# Patient Record
Sex: Female | Born: 1965 | Race: White | Hispanic: No | Marital: Married | Smoking: Current every day smoker
Health system: Southern US, Community
[De-identification: ages and names within clinical notes are randomized; demographics above are authoritative.]

## PROBLEM LIST (undated history)

## (undated) DIAGNOSIS — E785 Hyperlipidemia, unspecified: Secondary | ICD-10-CM

## (undated) DIAGNOSIS — Z124 Encounter for screening for malignant neoplasm of cervix: Secondary | ICD-10-CM

## (undated) DIAGNOSIS — R739 Hyperglycemia, unspecified: Secondary | ICD-10-CM

## (undated) DIAGNOSIS — I10 Essential (primary) hypertension: Secondary | ICD-10-CM

## (undated) DIAGNOSIS — Z720 Tobacco use: Secondary | ICD-10-CM

## (undated) DIAGNOSIS — R945 Abnormal results of liver function studies: Secondary | ICD-10-CM

## (undated) DIAGNOSIS — F419 Anxiety disorder, unspecified: Secondary | ICD-10-CM

## (undated) HISTORY — DX: Encounter for screening for malignant neoplasm of cervix: Z12.4

## (undated) HISTORY — DX: Abnormal results of liver function studies: R94.5

## (undated) HISTORY — DX: Tobacco use: Z72.0

## (undated) HISTORY — DX: Anxiety disorder, unspecified: F41.9

## (undated) HISTORY — DX: Hyperglycemia, unspecified: R73.9

## (undated) HISTORY — DX: Hyperlipidemia, unspecified: E78.5

## (undated) HISTORY — DX: Essential (primary) hypertension: I10

---

## 2002-02-12 ENCOUNTER — Encounter: Payer: Self-pay | Admitting: Emergency Medicine

## 2002-02-12 ENCOUNTER — Emergency Department (HOSPITAL_COMMUNITY): Admission: EM | Admit: 2002-02-12 | Discharge: 2002-02-12 | Payer: Self-pay | Admitting: Emergency Medicine

## 2004-05-10 ENCOUNTER — Other Ambulatory Visit: Admission: RE | Admit: 2004-05-10 | Discharge: 2004-05-10 | Payer: Self-pay | Admitting: Family Medicine

## 2007-12-24 LAB — HM PAP SMEAR

## 2008-08-02 ENCOUNTER — Emergency Department (HOSPITAL_BASED_OUTPATIENT_CLINIC_OR_DEPARTMENT_OTHER): Admission: EM | Admit: 2008-08-02 | Discharge: 2008-08-02 | Payer: Self-pay | Admitting: Emergency Medicine

## 2012-01-20 ENCOUNTER — Ambulatory Visit (INDEPENDENT_AMBULATORY_CARE_PROVIDER_SITE_OTHER): Payer: Managed Care, Other (non HMO) | Admitting: Family Medicine

## 2012-01-20 ENCOUNTER — Encounter: Payer: Self-pay | Admitting: Family Medicine

## 2012-01-20 DIAGNOSIS — Z720 Tobacco use: Secondary | ICD-10-CM

## 2012-01-20 DIAGNOSIS — R002 Palpitations: Secondary | ICD-10-CM

## 2012-01-20 DIAGNOSIS — E785 Hyperlipidemia, unspecified: Secondary | ICD-10-CM

## 2012-01-20 DIAGNOSIS — I10 Essential (primary) hypertension: Secondary | ICD-10-CM

## 2012-01-20 DIAGNOSIS — F419 Anxiety disorder, unspecified: Secondary | ICD-10-CM | POA: Insufficient documentation

## 2012-01-20 DIAGNOSIS — F17200 Nicotine dependence, unspecified, uncomplicated: Secondary | ICD-10-CM

## 2012-01-20 DIAGNOSIS — R7309 Other abnormal glucose: Secondary | ICD-10-CM

## 2012-01-20 DIAGNOSIS — F411 Generalized anxiety disorder: Secondary | ICD-10-CM

## 2012-01-20 DIAGNOSIS — R739 Hyperglycemia, unspecified: Secondary | ICD-10-CM

## 2012-01-20 MED ORDER — HYDROCHLOROTHIAZIDE 25 MG PO TABS: 25.0000 mg | ORAL_TABLET | Freq: Every day | ORAL | Status: DC

## 2012-01-20 NOTE — Patient Instructions (Addendum)

## 2012-01-22 ENCOUNTER — Encounter: Payer: Self-pay | Admitting: Family Medicine

## 2012-01-22 ENCOUNTER — Ambulatory Visit (INDEPENDENT_AMBULATORY_CARE_PROVIDER_SITE_OTHER): Payer: Managed Care, Other (non HMO) | Admitting: Family Medicine

## 2012-01-22 VITALS — BP 136/85 | HR 82 | Temp 98.2°F | Ht 62.0 in | Wt 219.8 lb

## 2012-01-22 DIAGNOSIS — I10 Essential (primary) hypertension: Secondary | ICD-10-CM

## 2012-01-22 DIAGNOSIS — H6091 Unspecified otitis externa, right ear: Secondary | ICD-10-CM

## 2012-01-22 DIAGNOSIS — J029 Acute pharyngitis, unspecified: Secondary | ICD-10-CM

## 2012-01-22 DIAGNOSIS — Z720 Tobacco use: Secondary | ICD-10-CM

## 2012-01-22 DIAGNOSIS — F17200 Nicotine dependence, unspecified, uncomplicated: Secondary | ICD-10-CM

## 2012-01-22 MED ORDER — AMOXICILLIN-POT CLAVULANATE 875-125 MG PO TABS: 1.0000 | ORAL_TABLET | Freq: Two times a day (BID) | ORAL | Status: AC

## 2012-01-22 MED ORDER — PREDNISONE 20 MG PO TABS: 40.0000 mg | ORAL_TABLET | Freq: Every day | ORAL | Status: AC

## 2012-01-22 NOTE — Patient Instructions (Signed)

## 2012-01-24 ENCOUNTER — Telehealth: Payer: Self-pay | Admitting: Family Medicine

## 2012-01-24 NOTE — Telephone Encounter (Signed)
That happens sometimes with Augmentin, make sure she is taking a probiotic and eating a yogurt daily and have her add Benefiber 2 tsp twice daily to food or drink. May also take I Imodium as needed for the next few days but let us know if this keeps up

## 2012-01-24 NOTE — Telephone Encounter (Signed)
Pt informed and states understandment 

## 2012-01-24 NOTE — Telephone Encounter (Signed)
Left a message with patients son to return my call. Pt is supposed to be home at 3:15 from work.

## 2012-01-24 NOTE — Telephone Encounter (Signed)
Patient has diarrhea from her antibiotic, she is doing okay doesn't feel like she needs an OV but just needs any tips that might help. She's at work so she said it's okay to leave a detailed mess on her home #.

## 2012-01-24 NOTE — Telephone Encounter (Signed)
PATIENT RETURNED YOUR CALL ABOUT THE ANTIBIOTIC PLEASE LEAVE DETAILED MESSAGE IF YOU DO NOT REACH HER

## 2012-01-26 ENCOUNTER — Encounter: Payer: Self-pay | Admitting: Family Medicine

## 2012-01-26 DIAGNOSIS — Z720 Tobacco use: Secondary | ICD-10-CM

## 2012-01-26 DIAGNOSIS — Z716 Tobacco abuse counseling: Secondary | ICD-10-CM | POA: Insufficient documentation

## 2012-01-26 HISTORY — DX: Tobacco use: Z72.0

## 2012-01-26 NOTE — Assessment & Plan Note (Addendum)
Patient denies any daily medication, does use Alprazolam prn and does not feel she needs daily medication at this time

## 2012-01-26 NOTE — Assessment & Plan Note (Signed)
Encouraged complete cessation, patient has managed to cut back from a ppd, patient will continue her attempts

## 2012-01-26 NOTE — Assessment & Plan Note (Signed)
Avoid caffeine, EKG unremarkable, will consider referral in the future if symptoms worsen

## 2012-01-26 NOTE — Assessment & Plan Note (Signed)
Patient reports a history of elevated blood sugars but has never been told diabetes, will check lab work to help further evaluate. Minimize simple carbs

## 2012-01-26 NOTE — Progress Notes (Signed)
Patient ID: Teresa Calderon, female   DOB: [birth year], 46 y.o.   MRN: [id number] Emilina Smarr [id number] 1966/06/27 01/26/2012      Progress Note New Patient  Subjective  Chief Complaint  Chief Complaint  Patient presents with  . Establish Care    new patient    HPI  Patient is a 46 year old Caucasian female who is in today for an appointment. She is struggling with some intermittent palpitations. She reports it happens several days a week. It is not prolonged nor there any associated symptoms this been going on for years and makes her very anxious. She did use alprazolam occasionally too well the symptoms. She has no other acute complaints. She's cut down half pack per day to 3 cigarettes a day. She denies chest pain, palpitations, shortness of breath, GI or GU complaints. She's not had a mammogram or colonoscopy ever. She complains of a stable white patch on her left anterior thigh.  Past Medical History  Diagnosis Date  . Hyperlipidemia   . Hypertension   . Anxiety   . Palpitations 01/20/2012  . Hyperglycemia   . Tobacco abuse 01/26/2012    Past Surgical History  Procedure Date  . Cesarean section 1990    Family History  Problem Relation Age of Onset  . Hypertension Mother   . Hyperlipidemia Mother   . COPD Father     smoked    History   Social History  . Marital Status: Married    Spouse Name: N/A    Number of Children: N/A  . Years of Education: N/A   Occupational History  . Not on file.   Social History Main Topics  . Smoking status: Current Everyday Smoker -- 0.5 packs/day for 20 years    Types: Cigarettes  . Smokeless tobacco: Never Used  . Alcohol Use: No  . Drug Use: No  . Sexually Active: Yes -- Female partner(s)   Other Topics Concern  . Not on file   Social History Narrative  . No narrative on file    No current outpatient prescriptions on file prior to visit.    No Known Allergies  Review of Systems  Review of Systems  Constitutional:  Negative for fever and malaise/fatigue.  HENT: Negative for hearing loss, congestion and tinnitus.   Eyes: Positive for pain. Negative for discharge.  Respiratory: Negative for cough and shortness of breath.   Cardiovascular: Positive for palpitations. Negative for chest pain and leg swelling.  Gastrointestinal: Negative for nausea, abdominal pain and diarrhea.  Genitourinary: Negative for dysuria, urgency and frequency.  Musculoskeletal: Negative for falls.  Skin: Negative for rash.  Neurological: Negative for dizziness, tingling, loss of consciousness and headaches.  Endo/Heme/Allergies: Negative for environmental allergies and polydipsia.  Psychiatric/Behavioral: Negative for depression and suicidal ideas. The patient is not nervous/anxious and does not have insomnia.     Objective  BP 138/80  Pulse 89  Temp(Src) 97.6 F (36.4 C) (Temporal)  Ht 5\' 2"  (1.575 m)  Wt 221 lb 12.8 oz (100.608 kg)  BMI 40.57 kg/m2  SpO2 97%  LMP 01/13/2012  Physical Exam  Physical Exam  Constitutional: She is oriented to person, place, and time and well-developed, well-nourished, and in no distress. No distress.  HENT:  Head: Normocephalic and atraumatic.  Right Ear: External ear normal.  Left Ear: External ear normal.  Nose: Nose normal.  Mouth/Throat: Oropharynx is clear and moist. No oropharyngeal exudate.  Eyes: Conjunctivae are normal. Pupils are equal, round, and reactive to light. Right  eye exhibits no discharge. Left eye exhibits no discharge. No scleral icterus.  Neck: Normal range of motion. Neck supple. No thyromegaly present.  Cardiovascular: Normal rate, regular rhythm, normal heart sounds and intact distal pulses.   No murmur heard. Pulmonary/Chest: Effort normal and breath sounds normal. No respiratory distress. She has no wheezes. She has no rales.  Abdominal: Soft. Bowel sounds are normal. She exhibits no distension and no mass. There is no tenderness.  Musculoskeletal: Normal  range of motion. She exhibits no edema and no tenderness.  Lymphadenopathy:    She has no cervical adenopathy.  Neurological: She is alert and oriented to person, place, and time. She has normal reflexes. No cranial nerve deficit. Coordination normal.  Skin: Skin is warm and dry. No rash noted. She is not diaphoretic. No erythema.       1 cm raised flesh colored patch left anterior thigh  Psychiatric: Mood, memory and affect normal.       Assessment & Plan  Tobacco abuse Encouraged complete cessation, patient has managed to cut back from a ppd, patient will continue her attempts  Hyperglycemia Patient reports a history of elevated blood sugars but has never been told diabetes, will check lab work to help further evaluate. Minimize simple carbs  Palpitations Avoid caffeine, EKG unremarkable, will consider referral in the future if symptoms worsen  Anxiety Patient denies any daily medication, does use Alprazolam prn and does not feel she needs daily medication at this time  Hypertension Improved on repeat, consider sodium restriction and we will continue current meds for now. Consider DASH diet  Hyperlipidemia Check lipid panel, avoid trans fats, start MegaRed

## 2012-01-26 NOTE — Assessment & Plan Note (Signed)
Improved on repeat, consider sodium restriction and we will continue current meds for now. Consider DASH diet

## 2012-01-26 NOTE — Assessment & Plan Note (Signed)
Check lipid panel, avoid trans fats, start MegaRed

## 2012-01-27 ENCOUNTER — Ambulatory Visit: Payer: Managed Care, Other (non HMO)

## 2012-01-27 ENCOUNTER — Encounter: Payer: Self-pay | Admitting: Family Medicine

## 2012-01-27 ENCOUNTER — Other Ambulatory Visit (INDEPENDENT_AMBULATORY_CARE_PROVIDER_SITE_OTHER): Payer: Managed Care, Other (non HMO)

## 2012-01-27 DIAGNOSIS — R7309 Other abnormal glucose: Secondary | ICD-10-CM

## 2012-01-27 DIAGNOSIS — E785 Hyperlipidemia, unspecified: Secondary | ICD-10-CM

## 2012-01-27 DIAGNOSIS — R002 Palpitations: Secondary | ICD-10-CM

## 2012-01-27 DIAGNOSIS — I10 Essential (primary) hypertension: Secondary | ICD-10-CM

## 2012-01-27 LAB — CBC
Hemoglobin: 16.1 g/dL — ABNORMAL HIGH (ref 12.0–15.0)
Platelets: 276 10*3/uL (ref 150.0–400.0)
WBC: 14.1 10*3/uL — ABNORMAL HIGH (ref 4.5–10.5)

## 2012-01-27 LAB — RENAL FUNCTION PANEL
Albumin: 4 g/dL (ref 3.5–5.2)
BUN: 11 mg/dL (ref 6–23)
CO2: 28 mEq/L (ref 19–32)
Calcium: 9.1 mg/dL (ref 8.4–10.5)
Chloride: 101 mEq/L (ref 96–112)

## 2012-01-27 LAB — LIPID PANEL
Cholesterol: 212 mg/dL — ABNORMAL HIGH (ref 0–200)
VLDL: 27.6 mg/dL (ref 0.0–40.0)

## 2012-01-27 LAB — HEPATIC FUNCTION PANEL
ALT: 58 U/L — ABNORMAL HIGH (ref 0–35)
AST: 40 U/L — ABNORMAL HIGH (ref 0–37)
Albumin: 4 g/dL (ref 3.5–5.2)
Total Protein: 7.7 g/dL (ref 6.0–8.3)

## 2012-01-27 LAB — T4, FREE: Free T4: 0.9 ng/dL (ref 0.60–1.60)

## 2012-01-27 NOTE — Progress Notes (Signed)
Patient ID: Hero Mccathern, female   DOB: February 11, 1966, 46 y.o.   MRN: [id number] Darra Rosa [id number] [birth year] 01/27/2012      Progress Note-Follow Up  Subjective  Chief Complaint  Chief Complaint  Patient presents with  . Sore Throat    X 1 day- left side    HPI  Patient is a 46 year old Caucasian female who is in today with a three-day history of worsening sore throat. She notes a sensation of swelling in her throat and pain worse with swallowing. She is also malaise, myalgias, congestion and cough. No headache, ear pain, chest pain, palpitations, shortness of breath, GI or GU complaints.  Past Medical History  Diagnosis Date  . Hyperlipidemia   . Hypertension   . Anxiety   . Palpitations 01/20/2012  . Hyperglycemia   . Tobacco abuse 01/26/2012  . Pharyngitis 01/27/2012    Past Surgical History  Procedure Date  . Cesarean section 1990    Family History  Problem Relation Age of Onset  . Hypertension Mother   . Hyperlipidemia Mother   . COPD Father     smoked    History   Social History  . Marital Status: Married    Spouse Name: N/A    Number of Children: N/A  . Years of Education: N/A   Occupational History  . Not on file.   Social History Main Topics  . Smoking status: Current Everyday Smoker -- 0.5 packs/day for 20 years    Types: Cigarettes  . Smokeless tobacco: Never Used  . Alcohol Use: No  . Drug Use: No  . Sexually Active: Yes -- Female partner(s)   Other Topics Concern  . Not on file   Social History Narrative  . No narrative on file    Current Outpatient Prescriptions on File Prior to Visit  Medication Sig Dispense Refill  . ALPRAZolam (XANAX) 0.25 MG tablet Take 0.25 mg by mouth daily.      . hydrochlorothiazide (HYDRODIURIL) 25 MG tablet Take 1 tablet (25 mg total) by mouth daily.  30 tablet  5  . metoprolol succinate (TOPROL-XL) 100 MG 24 hr tablet Take 100 mg by mouth daily. Take with or immediately following a meal.      . Multiple  Vitamin (MULTIVITAMIN) tablet Take 1 tablet by mouth daily.        No Known Allergies  Review of Systems  Review of Systems  Constitutional: Positive for malaise/fatigue. Negative for fever.  HENT: Positive for congestion and sore throat.   Eyes: Negative for discharge.  Respiratory: Positive for cough and sputum production. Negative for shortness of breath.   Cardiovascular: Negative for chest pain, palpitations and leg swelling.  Gastrointestinal: Negative for nausea, abdominal pain and diarrhea.  Genitourinary: Negative for dysuria.  Musculoskeletal: Negative for falls.  Skin: Negative for rash.  Neurological: Negative for loss of consciousness and headaches.  Endo/Heme/Allergies: Negative for polydipsia.  Psychiatric/Behavioral: Negative for depression. The patient is not nervous/anxious and does not have insomnia.     Objective  BP 136/85  Pulse 82  Temp(Src) 98.2 F (36.8 C) (Temporal)  Ht 5\' 2"  (1.575 m)  Wt 219 lb 12.8 oz (99.701 kg)  BMI 40.20 kg/m2  SpO2 97%  LMP 01/13/2012  Physical Exam  Physical Exam  Constitutional: She is oriented to person, place, and time and well-developed, well-nourished, and in no distress. No distress.  HENT:  Head: Normocephalic and atraumatic.       Oropharynx erythematous and swollen, white patch on left  tonsil  Eyes: Conjunctivae are normal.  Neck: Neck supple. No thyromegaly present.  Cardiovascular: Normal rate, regular rhythm and normal heart sounds.   No murmur heard. Pulmonary/Chest: Effort normal and breath sounds normal. She has no wheezes.  Abdominal: She exhibits no distension and no mass.  Musculoskeletal: She exhibits no edema.  Lymphadenopathy:    She has cervical adenopathy.  Neurological: She is alert and oriented to person, place, and time.  Skin: Skin is warm and dry. No rash noted. She is not diaphoretic.  Psychiatric: Memory, affect and judgment normal.      Assessment & Plan  Pharyngitis Augmentin  prescribed, if swelling sensation in throat or increased difficulty swallowing develops she is given an rx of some steroids to take as well, increase rest and fluids  Tobacco abuse Encouraged complete cessation once again  Hypertension Very mild elevation but patient acutely ill, will continue to monitor, minimize sodium and avoid Sudafed

## 2012-01-27 NOTE — Assessment & Plan Note (Signed)
Very mild elevation but patient acutely ill, will continue to monitor, minimize sodium and avoid Sudafed

## 2012-01-27 NOTE — Assessment & Plan Note (Signed)
Encouraged complete cessation once again 

## 2012-01-27 NOTE — Assessment & Plan Note (Signed)
Augmentin prescribed, if swelling sensation in throat or increased difficulty swallowing develops she is given an rx of some steroids to take as well, increase rest and fluids

## 2012-02-19 ENCOUNTER — Other Ambulatory Visit (HOSPITAL_COMMUNITY)
Admission: RE | Admit: 2012-02-19 | Discharge: 2012-02-19 | Disposition: A | Discharge status: Home / Self Care | Payer: Managed Care, Other (non HMO) | Source: Ambulatory Visit | Attending: Family Medicine | Admitting: Family Medicine

## 2012-02-19 ENCOUNTER — Ambulatory Visit (INDEPENDENT_AMBULATORY_CARE_PROVIDER_SITE_OTHER): Payer: Managed Care, Other (non HMO) | Admitting: Family Medicine

## 2012-02-19 ENCOUNTER — Encounter: Payer: Self-pay | Admitting: Family Medicine

## 2012-02-19 DIAGNOSIS — N760 Acute vaginitis: Secondary | ICD-10-CM | POA: Insufficient documentation

## 2012-02-19 DIAGNOSIS — F419 Anxiety disorder, unspecified: Secondary | ICD-10-CM

## 2012-02-19 DIAGNOSIS — R7989 Other specified abnormal findings of blood chemistry: Secondary | ICD-10-CM

## 2012-02-19 DIAGNOSIS — F411 Generalized anxiety disorder: Secondary | ICD-10-CM

## 2012-02-19 DIAGNOSIS — R079 Chest pain, unspecified: Secondary | ICD-10-CM

## 2012-02-19 DIAGNOSIS — Z124 Encounter for screening for malignant neoplasm of cervix: Secondary | ICD-10-CM

## 2012-02-19 DIAGNOSIS — E119 Type 2 diabetes mellitus without complications: Secondary | ICD-10-CM

## 2012-02-19 DIAGNOSIS — Z720 Tobacco use: Secondary | ICD-10-CM

## 2012-02-19 DIAGNOSIS — I10 Essential (primary) hypertension: Secondary | ICD-10-CM

## 2012-02-19 DIAGNOSIS — F17200 Nicotine dependence, unspecified, uncomplicated: Secondary | ICD-10-CM

## 2012-02-19 DIAGNOSIS — E785 Hyperlipidemia, unspecified: Secondary | ICD-10-CM

## 2012-02-19 DIAGNOSIS — R945 Abnormal results of liver function studies: Secondary | ICD-10-CM

## 2012-02-19 DIAGNOSIS — Z01419 Encounter for gynecological examination (general) (routine) without abnormal findings: Secondary | ICD-10-CM | POA: Insufficient documentation

## 2012-02-19 DIAGNOSIS — R7309 Other abnormal glucose: Secondary | ICD-10-CM | POA: Insufficient documentation

## 2012-02-19 HISTORY — DX: Encounter for screening for malignant neoplasm of cervix: Z12.4

## 2012-02-19 MED ORDER — ASPIRIN 81 MG PO TBEC: 81.0000 mg | DELAYED_RELEASE_TABLET | Freq: Every day | ORAL | Status: DC

## 2012-02-19 NOTE — Patient Instructions (Signed)

## 2012-02-23 ENCOUNTER — Encounter: Payer: Self-pay | Admitting: Family Medicine

## 2012-02-23 DIAGNOSIS — R7989 Other specified abnormal findings of blood chemistry: Secondary | ICD-10-CM

## 2012-02-23 DIAGNOSIS — R945 Abnormal results of liver function studies: Secondary | ICD-10-CM

## 2012-02-23 HISTORY — DX: Abnormal results of liver function studies: R94.5

## 2012-02-23 HISTORY — DX: Other specified abnormal findings of blood chemistry: R79.89

## 2012-02-23 NOTE — Assessment & Plan Note (Signed)
Well controlled, no changes to therapy

## 2012-02-23 NOTE — Assessment & Plan Note (Signed)
Reports intermittent episodes of chest pain, occur roughly every other day and usually when under stress. Pain is often sharp and burning. Likely GI or anxiety related but patient does have some concerning risk factors for cardiac disease. Will refer for cardiology consultation, she will start a 81 mg ECASA daily.

## 2012-02-23 NOTE — Assessment & Plan Note (Addendum)
Pap taken today.   Discussed need for adequate sleep, exercise and heart healthy diet

## 2012-02-23 NOTE — Progress Notes (Signed)
Patient ID: Teresa Calderon, female   DOB: 1966/04/03, 46 y.o.   MRN: [id number] Marion Seese [id number] [birth year] 02/23/2012      Progress Note New Patient  Subjective  Chief Complaint  Chief Complaint  Patient presents with  . Gynecologic Exam    pap w/ breast exam    HPI  Patient is a 44 old Caucasian female in today for GYN exam. She is complaining of intermittent episodes of chest pain. She describes them as occurring roughly every other day. She notes that she is usually feeling stressed and busy with her daily activities when they occur. She denies any palpitations, shortness of breath, nausea or diaphoresis with them. She describes the pains as largely burning and occasionally sharp. They last minutes and then resolved. She does not experience significant fatigue when the pains are gone. She has also been experiencing some dyspepsia and heartburn. Denies any other GI complaints such as change in bowels, bloody or tarry stool. No urinary complaints. No recent fevers, chills, headache, congestion. She does not she's been under a great deal of stress lately but does not elaborate.  Past Medical History  Diagnosis Date  . Hyperlipidemia   . Hypertension   . Anxiety   . Palpitations 01/20/2012  . Hyperglycemia   . Tobacco abuse 01/26/2012  . Pharyngitis 01/27/2012  . Chest pain 02/19/2012  . Cervical cancer screening 02/19/2012  . Diabetes mellitus 02/19/2012  . Abnormal LFTs 02/23/2012    Past Surgical History  Procedure Date  . Cesarean section 1990    Family History  Problem Relation Age of Onset  . Hypertension Mother   . Hyperlipidemia Mother   . COPD Father     smoked    History   Social History  . Marital Status: Married    Spouse Name: N/A    Number of Children: N/A  . Years of Education: N/A   Occupational History  . Not on file.   Social History Main Topics  . Smoking status: Current Everyday Smoker -- 0.5 packs/day for 20 years    Types: Cigarettes  .  Smokeless tobacco: Never Used  . Alcohol Use: No  . Drug Use: No  . Sexually Active: Yes -- Female partner(s)   Other Topics Concern  . Not on file   Social History Narrative  . No narrative on file    Current Outpatient Prescriptions on File Prior to Visit  Medication Sig Dispense Refill  . ALPRAZolam (XANAX) 0.25 MG tablet Take 0.25 mg by mouth daily.      . hydrochlorothiazide (HYDRODIURIL) 25 MG tablet Take 1 tablet (25 mg total) by mouth daily.  30 tablet  5  . metoprolol succinate (TOPROL-XL) 100 MG 24 hr tablet Take 100 mg by mouth daily. Take with or immediately following a meal.      . Multiple Vitamin (MULTIVITAMIN) tablet Take 1 tablet by mouth daily.        No Known Allergies  Review of Systems  Review of Systems  Constitutional: Negative for fever, chills and malaise/fatigue.  HENT: Negative for hearing loss, nosebleeds and congestion.   Eyes: Negative for discharge.  Respiratory: Negative for cough, sputum production, shortness of breath and wheezing.   Cardiovascular: Positive for chest pain. Negative for palpitations and leg swelling.  Gastrointestinal: Positive for heartburn. Negative for nausea, vomiting, abdominal pain, diarrhea, constipation and blood in stool.  Genitourinary: Negative for dysuria, urgency, frequency and hematuria.  Musculoskeletal: Negative for myalgias, back pain and falls.  Skin: Negative  for rash.  Neurological: Negative for dizziness, tremors, sensory change, focal weakness, loss of consciousness, weakness and headaches.  Endo/Heme/Allergies: Negative for polydipsia. Does not bruise/bleed easily.  Psychiatric/Behavioral: Negative for depression and suicidal ideas. The patient is nervous/anxious. The patient does not have insomnia.     Objective  BP 127/86  Pulse 81  Temp(Src) 98.4 F (36.9 C) (Temporal)  Ht 5\' 2"  (1.575 m)  Wt 224 lb (101.606 kg)  BMI 40.97 kg/m2  SpO2 99%  LMP 02/04/2012  Physical Exam  Physical Exam    Constitutional: She is oriented to person, place, and time and well-developed, well-nourished, and in no distress. No distress.  HENT:  Head: Normocephalic and atraumatic.  Right Ear: External ear normal.  Left Ear: External ear normal.  Nose: Nose normal.  Mouth/Throat: Oropharynx is clear and moist. No oropharyngeal exudate.  Eyes: Conjunctivae are normal. Pupils are equal, round, and reactive to light. Right eye exhibits no discharge. Left eye exhibits no discharge. No scleral icterus.  Neck: Normal range of motion. Neck supple. No thyromegaly present.  Cardiovascular: Normal rate, regular rhythm, normal heart sounds and intact distal pulses.   No murmur heard. Pulmonary/Chest: Effort normal and breath sounds normal. No respiratory distress. She has no wheezes. She has no rales.  Abdominal: Soft. Bowel sounds are normal. She exhibits no distension and no mass. There is no tenderness.  Genitourinary: Uterus normal, cervix normal, right adnexa normal and left adnexa normal. Vaginal discharge found.       Small amount whitish discharge  Breast exam unremarkable b/l. No d/c, lesions, skin changes.  Musculoskeletal: Normal range of motion. She exhibits no edema and no tenderness.  Lymphadenopathy:    She has no cervical adenopathy.  Neurological: She is alert and oriented to person, place, and time. She has normal reflexes. No cranial nerve deficit. Coordination normal.  Skin: Skin is warm and dry. No rash noted. She is not diaphoretic.  Psychiatric: Mood, memory and affect normal.       Assessment & Plan  Cervical cancer screening Pap taken today.   Diabetes mellitus hgba1c 7.0. Avoid simple carbs, increase exercise and we will revcheck levels in 3 months, given information on diabetic diet.  Abnormal LFTs Likely fatty liver disease. Avoid simple carbs and trans fats and continue to monitor. Consider ultrasound if numbers remain elevated  Chest pain Reports intermittent  episodes of chest pain, occur roughly every other day and usually when under stress. Pain is often sharp and burning. Likely GI or anxiety related but patient does have some concerning risk factors for cardiac disease. Will refer for cardiology consultation, she will start a 81 mg ECASA daily.  Anxiety May use Alprazolam prn and consider trying it when her CP occurs.  Tobacco abuse Encouraged complete cessation  Hypertension Well controlled, no changes to therapy  Hyperlipidemia Avoid trans fats, increase exercise and start megaRed caps daily

## 2012-02-23 NOTE — Assessment & Plan Note (Signed)
Encouraged complete cessation. 

## 2012-02-23 NOTE — Assessment & Plan Note (Signed)
Likely fatty liver disease. Avoid simple carbs and trans fats and continue to monitor. Consider ultrasound if numbers remain elevated

## 2012-02-23 NOTE — Assessment & Plan Note (Signed)
hgba1c 7.0. Avoid simple carbs, increase exercise and we will revcheck levels in 3 months, given information on diabetic diet.

## 2012-02-23 NOTE — Assessment & Plan Note (Signed)
May use Alprazolam prn and consider trying it when her CP occurs.

## 2012-02-23 NOTE — Assessment & Plan Note (Signed)
Avoid trans fats, increase exercise and start megaRed caps daily

## 2012-03-18 ENCOUNTER — Ambulatory Visit (INDEPENDENT_AMBULATORY_CARE_PROVIDER_SITE_OTHER): Payer: Managed Care, Other (non HMO) | Admitting: Cardiology

## 2012-03-18 ENCOUNTER — Encounter: Payer: Self-pay | Admitting: Cardiology

## 2012-03-18 VITALS — BP 150/90 | HR 81 | Ht 62.0 in | Wt 205.0 lb

## 2012-03-18 DIAGNOSIS — I10 Essential (primary) hypertension: Secondary | ICD-10-CM

## 2012-03-18 DIAGNOSIS — R002 Palpitations: Secondary | ICD-10-CM

## 2012-03-18 DIAGNOSIS — R0600 Dyspnea, unspecified: Secondary | ICD-10-CM | POA: Insufficient documentation

## 2012-03-18 DIAGNOSIS — Z720 Tobacco use: Secondary | ICD-10-CM

## 2012-03-18 DIAGNOSIS — R0609 Other forms of dyspnea: Secondary | ICD-10-CM

## 2012-03-18 DIAGNOSIS — F17200 Nicotine dependence, unspecified, uncomplicated: Secondary | ICD-10-CM

## 2012-03-18 NOTE — Assessment & Plan Note (Signed)
We discussed a specific strategy for tobacco cessation.  (Greater than three minutes discussing tobacco cessation.)  

## 2012-03-18 NOTE — Patient Instructions (Signed)
The current medical regimen is effective;  continue present plan and medications.  Your physician has recommended that you wear a holter monitor. Holter monitors are medical devices that record the heart's electrical activity. Doctors most often use these monitors to diagnose arrhythmias. Arrhythmias are problems with the speed or rhythm of the heartbeat. The monitor is a small, portable device. You can wear one while you do your normal daily activities. This is usually used to diagnose what is causing palpitations/syncope (passing out).  Your physician has requested that you have an exercise tolerance test. For further information please visit www.[email or web address]. Please also follow instruction sheet, as given.   

## 2012-03-18 NOTE — Assessment & Plan Note (Signed)
Her blood pressure is not at target today. We discussed weight loss and she will otherwise continue the medications as listed but he denied on this and she may need to titrate this is a trend.

## 2012-03-18 NOTE — Progress Notes (Signed)
HPI The patient presents for evaluation of a "jolt" it happens in her chest. This has been going on for 2 years. It happens several times per day. She describes a sensation in her mid chest. She doesn't know if her pulse skips. She doesn't get presyncopal or syncopal. She does not describe chest pressure, neck or arm discomfort. She does have some shortness of breath with activity but this has been chronic. She doesn't describe PND or orthopnea.  She cannot make these happen. It happens sporadically with activity or at rest. She has an active job but does not exercise.   No Known Allergies  Current Outpatient Prescriptions  Medication Sig Dispense Refill  . ALPRAZolam (XANAX) 0.25 MG tablet Take 0.25 mg by mouth at bedtime as needed.       . hydrochlorothiazide (HYDRODIURIL) 25 MG tablet Take 1 tablet (25 mg total) by mouth daily.  30 tablet  5  . metoprolol succinate (TOPROL-XL) 100 MG 24 hr tablet Take 100 mg by mouth daily. Take with or immediately following a meal.      . Multiple Vitamin (MULTIVITAMIN) tablet Take 1 tablet by mouth daily.        Past Medical History  Diagnosis Date  . Hyperlipidemia   . Hypertension   . Anxiety   . Palpitations 01/20/2012  . Hyperglycemia   . Tobacco abuse 01/26/2012  . Pharyngitis 01/27/2012  . Chest pain 02/19/2012  . Cervical cancer screening 02/19/2012  . Diabetes mellitus 02/19/2012  . Abnormal LFTs 02/23/2012    Past Surgical History  Procedure Date  . Cesarean section 1990    Family History  Problem Relation Age of Onset  . Hypertension Mother   . Hyperlipidemia Mother   . COPD Father     smoked    History   Social History  . Marital Status: Married    Spouse Name: N/A    Number of Children: N/A  . Years of Education: N/A   Occupational History  . Not on file.   Social History Main Topics  . Smoking status: Current Everyday Smoker -- 0.5 packs/day for 20 years    Types: Cigarettes  . Smokeless tobacco: Never Used  .  Alcohol Use: No  . Drug Use: No  . Sexually Active: Yes -- Female partner(s)   Other Topics Concern  . Not on file   Social History Narrative  . No narrative on file    ROS:  As stated in the HPI and negative for all other systems.  PHYSICAL EXAM BP 150/90  Pulse 81  Ht 5\' 2"  (1.575 m)  Wt 205 lb (92.987 kg)  BMI 37.49 kg/m2  LMP 02/04/2012 GENERAL:  Well appearing HEENT:  Pupils equal round and reactive, fundi not visualized, oral mucosa unremarkable NECK:  No jugular venous distention, waveform within normal limits, carotid upstroke brisk and symmetric, no bruits, no thyromegaly LYMPHATICS:  No cervical, inguinal adenopathy LUNGS:  Clear to auscultation bilaterally BACK:  No CVA tenderness CHEST:  Unremarkable HEART:  PMI not displaced or sustained,S1 and S2 within normal limits, no S3, no S4, no clicks, no rubs, no murmurs ABD:  Flat, positive bowel sounds normal in frequency in pitch, no bruits, no rebound, no guarding, no midline pulsatile mass, no hepatomegaly, no splenomegaly EXT:  2 plus pulses throughout, no edema, no cyanosis no clubbing SKIN:  No rashes no nodules NEURO:  Cranial nerves II through XII grossly intact, motor grossly intact throughout PSYCH:  Cognitively intact, oriented to person  place and time  EKG:  Sinus rhythm, rate 81, axis within normal limits, intervals within normal limits, no acute ST-T wave changes.  ASSESSMENT AND PLAN

## 2012-03-18 NOTE — Assessment & Plan Note (Signed)
I will bring the patient back for a POET (Plain Old Exercise Test). This will allow me to screen for obstructive coronary disease, risk stratify and very importantly provide a prescription for exercise.   

## 2012-03-18 NOTE — Assessment & Plan Note (Signed)
She might do describing palpitations. I would like to put her up to a 48 hour Holter monitor to further evaluate this. Further management will be based on these results.

## 2012-04-13 ENCOUNTER — Encounter: Payer: Managed Care, Other (non HMO) | Admitting: Nurse Practitioner

## 2012-05-12 ENCOUNTER — Telehealth: Payer: Self-pay

## 2012-05-19 ENCOUNTER — Ambulatory Visit: Payer: Managed Care, Other (non HMO) | Admitting: Family Medicine

## 2012-05-19 ENCOUNTER — Other Ambulatory Visit (INDEPENDENT_AMBULATORY_CARE_PROVIDER_SITE_OTHER): Payer: Managed Care, Other (non HMO)

## 2012-05-19 DIAGNOSIS — E785 Hyperlipidemia, unspecified: Secondary | ICD-10-CM

## 2012-05-19 DIAGNOSIS — I10 Essential (primary) hypertension: Secondary | ICD-10-CM

## 2012-05-19 DIAGNOSIS — R7989 Other specified abnormal findings of blood chemistry: Secondary | ICD-10-CM

## 2012-05-19 DIAGNOSIS — E119 Type 2 diabetes mellitus without complications: Secondary | ICD-10-CM

## 2012-05-19 LAB — T4, FREE: Free T4: 0.96 ng/dL (ref 0.60–1.60)

## 2012-05-19 LAB — RENAL FUNCTION PANEL
Albumin: 3.6 g/dL (ref 3.5–5.2)
BUN: 12 mg/dL (ref 6–23)
Calcium: 9 mg/dL (ref 8.4–10.5)
Chloride: 101 mEq/L (ref 96–112)
Phosphorus: 3.1 mg/dL (ref 2.3–4.6)

## 2012-05-19 LAB — CBC
Hemoglobin: 14.8 g/dL (ref 12.0–15.0)
Platelets: 287 10*3/uL (ref 150.0–400.0)
RBC: 4.97 Mil/uL (ref 3.87–5.11)
WBC: 11.6 10*3/uL — ABNORMAL HIGH (ref 4.5–10.5)

## 2012-05-19 LAB — HEPATIC FUNCTION PANEL
ALT: 28 U/L (ref 0–35)
AST: 23 U/L (ref 0–37)
Alkaline Phosphatase: 76 U/L (ref 39–117)
Bilirubin, Direct: 0 mg/dL (ref 0.0–0.3)
Total Protein: 6.9 g/dL (ref 6.0–8.3)

## 2012-05-19 LAB — LIPID PANEL
HDL: 49.8 mg/dL (ref >39.00)
Total CHOL/HDL Ratio: 4

## 2012-05-20 LAB — MICROALBUMIN / CREATININE URINE RATIO
Creatinine,U: 175 mg/dL
Microalb Creat Ratio: 0.9 mg/g (ref 0.0–30.0)

## 2012-05-25 ENCOUNTER — Ambulatory Visit: Payer: Managed Care, Other (non HMO) | Admitting: Family Medicine

## 2012-06-01 ENCOUNTER — Encounter: Payer: Managed Care, Other (non HMO) | Admitting: Physician Assistant

## 2012-06-05 ENCOUNTER — Ambulatory Visit: Payer: Managed Care, Other (non HMO) | Admitting: Family Medicine

## 2012-06-10 ENCOUNTER — Other Ambulatory Visit: Payer: Self-pay

## 2012-06-10 MED ORDER — METOPROLOL SUCCINATE ER 100 MG PO TB24: 100.0000 mg | ORAL_TABLET | Freq: Every day | ORAL | Status: DC

## 2012-06-10 NOTE — Telephone Encounter (Signed)
RX sent to pharmacy  

## 2012-06-11 ENCOUNTER — Ambulatory Visit: Payer: Managed Care, Other (non HMO) | Admitting: Cardiology

## 2012-07-13 ENCOUNTER — Other Ambulatory Visit: Payer: Self-pay | Admitting: Family Medicine

## 2012-07-13 NOTE — Telephone Encounter (Signed)
Left a message for patient to return my call. I need to know what medication to send to pharmacy.

## 2012-07-14 MED ORDER — METOPROLOL SUCCINATE ER 100 MG PO TB24: 100.0000 mg | ORAL_TABLET | Freq: Every day | ORAL | Status: DC

## 2012-07-14 NOTE — Telephone Encounter (Signed)
Pt informed Metoprolol was already sent

## 2012-08-12 NOTE — Telephone Encounter (Signed)
Patient never call back for monitor 

## 2012-12-12 ENCOUNTER — Other Ambulatory Visit: Payer: Self-pay | Admitting: Family Medicine

## 2013-01-09 ENCOUNTER — Other Ambulatory Visit: Payer: Self-pay | Admitting: Family Medicine

## 2013-02-22 ENCOUNTER — Other Ambulatory Visit: Payer: Self-pay | Admitting: Family Medicine

## 2013-03-15 ENCOUNTER — Telehealth: Payer: Self-pay | Admitting: Family Medicine

## 2013-03-15 MED ORDER — METOPROLOL SUCCINATE ER 100 MG PO TB24: 100.0000 mg | ORAL_TABLET | Freq: Every day | ORAL | Status: DC

## 2013-03-15 NOTE — Telephone Encounter (Signed)
Sent 15 tabs of metoprolol to pharmacy due to patient not being seen since 02-19-12. I also left a detailed message on patients answering machine

## 2013-03-15 NOTE — Telephone Encounter (Signed)
Refill metoprolol 100 mg er tab qty 30 take 1 tablet by mouth every day with or immediately after a meal

## 2013-03-24 ENCOUNTER — Telehealth: Payer: Self-pay | Admitting: Family Medicine

## 2013-03-24 NOTE — Telephone Encounter (Signed)
Patient has lost her insurance coverage but has enrolled in a new policy which will go in effect the first of May 2014. Patient has rescheduled her 03/26/13 appt to coincide with the new insurance coverage. Can she get refills on Metropopral & Hydrochloride? She needs enough just to make it until she has her next OV. Please advise.

## 2013-03-24 NOTE — Telephone Encounter (Signed)
Please advise? Last OV was 02-19-12. On 03-15-13 we gave pt 15 tabs of Metoprolol and left detailed message on pts vm that appt needed to be made for addt refills?

## 2013-03-24 NOTE — Telephone Encounter (Signed)
OK can have 30 day supply of both meds

## 2013-03-25 MED ORDER — HYDROCHLOROTHIAZIDE 25 MG PO TABS: 25.0000 mg | ORAL_TABLET | Freq: Every day | ORAL | Status: DC

## 2013-03-25 MED ORDER — METOPROLOL SUCCINATE ER 100 MG PO TB24: 100.0000 mg | ORAL_TABLET | Freq: Every day | ORAL | Status: DC

## 2013-03-25 NOTE — Telephone Encounter (Signed)
RX faxed

## 2013-03-26 ENCOUNTER — Ambulatory Visit: Payer: Managed Care, Other (non HMO) | Admitting: Family Medicine

## 2013-04-22 ENCOUNTER — Ambulatory Visit (INDEPENDENT_AMBULATORY_CARE_PROVIDER_SITE_OTHER): Payer: Self-pay | Admitting: Nurse Practitioner

## 2013-04-22 ENCOUNTER — Encounter: Payer: Self-pay | Admitting: Nurse Practitioner

## 2013-04-22 VITALS — BP 140/80 | HR 98 | Temp 98.4°F | Ht 62.0 in | Wt 218.0 lb

## 2013-04-22 DIAGNOSIS — I10 Essential (primary) hypertension: Secondary | ICD-10-CM

## 2013-04-22 DIAGNOSIS — N390 Urinary tract infection, site not specified: Secondary | ICD-10-CM

## 2013-04-22 DIAGNOSIS — R300 Dysuria: Secondary | ICD-10-CM

## 2013-04-22 LAB — POCT URINALYSIS DIPSTICK
Leukocytes, UA: NEGATIVE
Nitrite, UA: POSITIVE
Urobilinogen, UA: 0.2
pH, UA: 5

## 2013-04-22 LAB — COMPREHENSIVE METABOLIC PANEL
ALT: 22 U/L (ref 0–35)
AST: 19 U/L (ref 0–37)
BUN: 12 mg/dL (ref 6–23)
Creat: 0.61 mg/dL (ref 0.50–1.10)
Total Bilirubin: 0.3 mg/dL (ref 0.3–1.2)

## 2013-04-22 MED ORDER — METOPROLOL SUCCINATE ER 100 MG PO TB24: 100.0000 mg | ORAL_TABLET | Freq: Every day | ORAL | Status: DC

## 2013-04-22 MED ORDER — HYDROCHLOROTHIAZIDE 25 MG PO TABS: 25.0000 mg | ORAL_TABLET | Freq: Every day | ORAL | Status: DC

## 2013-04-22 MED ORDER — NITROFURANTOIN MONOHYD MACRO 100 MG PO CAPS: 100.0000 mg | ORAL_CAPSULE | Freq: Two times a day (BID) | ORAL | Status: DC

## 2013-04-22 NOTE — Patient Instructions (Addendum)
Hypertension Hypertension is another name for high blood pressure. High blood pressure may mean that your heart needs to work harder to pump blood. Blood pressure consists of two numbers, which includes a higher number over a lower number (example: 110/72). HOME CARE   Make lifestyle changes as told by your doctor. This may include weight loss and exercise.  Take your blood pressure medicine every day.  Limit how much salt you use.  Stop smoking if you smoke.  Do not use drugs.  Talk to your doctor if you are using decongestants or birth control pills. These medicines might make blood pressure higher.  Females should not drink more than 1 alcoholic drink per day. Males should not drink more than 2 alcoholic drinks per day.  See your doctor as told. GET HELP RIGHT AWAY IF:   You have a blood pressure reading with a top number of 180 or higher.  You get a very bad headache.  You get blurred or changing vision.  You feel confused.  You feel weak, numb, or faint.  You get chest or belly (abdominal) pain.  You throw up (vomit).  You cannot breathe very well. MAKE SURE YOU:   Understand these instructions.  Will watch your condition.  Will get help right away if you are not doing well or get worse. Document Released: 05/27/2008 Document Revised: 03/02/2012 Document Reviewed: 05/27/2008 [facility] Patient Information 2013 [place], [place]. Hypertension Hypertension is another name for high blood pressure. High blood pressure may mean that your heart needs to work harder to pump blood. Blood pressure consists of two numbers, which includes a higher number over a lower number (example: 110/72). HOME CARE   Make lifestyle changes as told by your doctor. This may include weight loss and exercise.  Take your blood pressure medicine every day.  Limit how much salt you use.  Stop smoking if you smoke.  Do not use drugs.  Talk to your doctor if you are using decongestants or  birth control pills. These medicines might make blood pressure higher.  Females should not drink more than 1 alcoholic drink per day. Males should not drink more than 2 alcoholic drinks per day.  See your doctor as told. GET HELP RIGHT AWAY IF:   You have a blood pressure reading with a top number of 180 or higher.  You get a very bad headache.  You get blurred or changing vision.  You feel confused.  You feel weak, numb, or faint.  You get chest or belly (abdominal) pain.  You throw up (vomit).  You cannot breathe very well. MAKE SURE YOU:   Understand these instructions.  Will watch your condition.  Will get help right away if you are not doing well or get worse. Document Released: 05/27/2008 Document Revised: 03/02/2012 Document Reviewed: 05/27/2008 [facility] Patient Information 2013 [place], [place]. Obesity Obesity is having too much body fat and a body mass index (BMI) of 30 or more. BMI is a number based on your height and weight. The number is an estimate of how much body fat you have. Obesity can happen if you eat more calories than you can burn by exercising or other activity. It can cause major health problems or emergencies.  HOME CARE  Exercise and be active as told by your doctor. Try:  Using stairs when you can.  Parking farther away from store doors.  Gardening, biking, or walking.  Eat healthy foods and drinks that are low in calories. Eat more fruits  and vegetables.  Limit fast food, sweets, and snack foods that are made with ingredients that are not natural (processed food).  Eat smaller amounts of food.  Keep a journal and write down what you eat every day. Websites can help with this.  Avoid drinking alcohol. Drink more water and drinks without calories.   Take vitamins and dietary pills (supplements) only as told by your doctor.  Try going to weight-loss support groups or classes to help lessen stress. Dieticians and counselors may also  help. GET HELP RIGHT AWAY IF:  You have chest pain or tightness.  You have trouble breathing or feel short of breath.  You feel weak or have loss of feeling (numbness) in your legs.  You feel confused or have trouble talking.  You have sudden changes in your vision. MAKE SURE YOU:  Understand these instructions.  Will watch your condition.  Will get help right away if you are not doing well or get worse. Document Released: 03/02/2012 Document Reviewed: 03/02/2012 [facility] Patient Information 2013 [place], [place].  Marland KitchenUrinary Tract Infection A urinary tract infection (UTI) is often caused by a germ (bacteria). A UTI is usually helped with medicine (antibiotics) that kills germs. Take all the medicine until it is gone. Do this even if you are feeling better. You are usually better in 7 to 10 days. HOME CARE   Drink enough water and fluids to keep your pee (urine) clear or pale yellow. Drink:  Cranberry juice.  Water.  Avoid:  Caffeine.  Tea.  Bubbly (carbonated) drinks.  Alcohol.  Only take medicine as told by your doctor.  To prevent further infections:  Pee often.  After pooping (bowel movement), women should wipe from front to back. Use each tissue only once.  Pee before and after having sex (intercourse). Ask your doctor when your test results will be ready. Make sure you follow up and get your test results.  GET HELP RIGHT AWAY IF:   There is very bad back pain or lower belly (abdominal) pain.  You get the chills.  You have a fever.  Your baby is older than 3 months with a rectal temperature of 102 F (38.9 C) or higher.  Your baby is 40 months old or younger with a rectal temperature of 100.4 F (38 C) or higher.  You feel sick to your stomach (nauseous) or throw up (vomit).  There is continued burning with peeing.  Your problems are not better in 3 days. Return sooner if you are getting worse. MAKE SURE YOU:   Understand these  instructions.  Will watch your condition.  Will get help right away if you are not doing well or get worse. Document Released: 05/27/2008 Document Revised: 03/02/2012 Document Reviewed: 05/27/2008 [facility] Patient Information 2013 [place], [place].  Return in 1 month for cholesterol and diabetes screen. You are also due for PAP screen. You should have a pneumonia vaccine because you are a smoker. We can also refer you to cardiology for ongoing episodes of chest discomfort, and we will refer for sleep study. You have a urinary tract infection. Take the antibiotic prescribed. Increase water to 6 glasses daily. Also take 500 mg Vit C twice daily. Return if no relief within 4 days.

## 2013-04-22 NOTE — Progress Notes (Signed)
Subjective:    Patient ID: Teresa Calderon, female    DOB: Sep 02, 1966, 47 y.o.   MRN: [id number]  Dysuria  This is a new problem. The current episode started 1 to 4 weeks ago (about 10 days). The problem occurs intermittently. The problem has been unchanged. The quality of the pain is described as burning. The pain is mild. There has been no fever. She is sexually active. There is no history of pyelonephritis. Associated symptoms include urgency. Pertinent negatives include no chills, discharge, flank pain, hematuria, nausea, possible pregnancy (MC 2 weeks ago) or vomiting. She has tried nothing for the symptoms. There is no history of kidney stones or recurrent UTIs. hypertension, smoker  Hypertension This is a chronic problem. The current episode started more than 1 year ago. The problem is unchanged. The problem is controlled. Associated symptoms include chest pain (reports occasional chest pain, sharp, achy lasting few seconds), malaise/fatigue and palpitations (had partial cardiac work-up 1 year ago. Pt did not complete holter monitor). Pertinent negatives include no headaches or shortness of breath. Associated agents: smoker. Risk factors for coronary artery disease include obesity, smoking/tobacco exposure and sedentary lifestyle. Past treatments include beta blockers, diuretics and lifestyle changes (lost nearly 20 pounds 1 year ago, lipids improved. ). The current treatment provides moderate improvement. Compliance problems include diet and exercise (recent 15 pond weight gain).  Identifiable causes of hypertension include sleep apnea (pt thinks she has sleep apnea).      Review of Systems  Constitutional: Positive for malaise/fatigue and fatigue (reports daytime sleepiness). Negative for fever and chills.  Respiratory: Positive for apnea (thinks has sleep apnea). Negative for cough, chest tightness and shortness of breath.   Cardiovascular: Positive for chest pain (reports occasional chest  pain, sharp, achy lasting few seconds) and palpitations (had partial cardiac work-up 1 year ago. Pt did not complete holter monitor). Negative for leg swelling.  Gastrointestinal: Negative for nausea, vomiting, abdominal pain and abdominal distention.  Genitourinary: Positive for dysuria and urgency. Negative for hematuria and flank pain.  Musculoskeletal: Negative for back pain.  Skin: Negative for color change.  Neurological: Negative for weakness, numbness and headaches.  Hematological: Negative for adenopathy.  Psychiatric/Behavioral: Positive for sleep disturbance (trouble falling asleep, interrupted sleep due to snoring).       Objective:   Physical Exam  Vitals reviewed. Constitutional: She is oriented to person, place, and time. She appears well-developed and well-nourished. No distress (not acutely distressed, but states worries about health).  HENT:  Head: Normocephalic and atraumatic.  Right Ear: External ear normal.  Left Ear: External ear normal.  Nose: Nose normal.  Mouth/Throat: Oropharynx is clear and moist. No oropharyngeal exudate.  Eyes: Conjunctivae are normal.  Neck: Normal range of motion. Neck supple. No JVD present. No thyromegaly present.  No carotid thrill or bruit  Cardiovascular: Normal rate, regular rhythm and normal heart sounds.   No murmur heard. Pulmonary/Chest: Effort normal and breath sounds normal. No respiratory distress. She has no wheezes.  Abdominal: Soft. She exhibits no distension and no mass. There is no tenderness. There is no rebound and no guarding.  Lymphadenopathy:    She has no cervical adenopathy.  Neurological: She is alert and oriented to person, place, and time.  Skin: Skin is warm and dry.  Psychiatric: She has a normal mood and affect. Her behavior is normal. Thought content normal.          Assessment & Plan:  1. Essential hypertension, benign BP today 140/80. Pt still  does not have health ins. Hopes to have coverage  next month. Plan to come back for lipids,and HgBA1C, PAP screen, pneumonia vaccine, referral for sleep study and to cardiology to complete work up. Discussed need for weight loss. Recommend DASH diet & Smoking cessation. - Comprehensive metabolic panel - hydrochlorothiazide (HYDRODIURIL) 25 MG tablet; Take 1 tablet (25 mg total) by mouth daily.  Dispense: 30 tablet; Refill: 2 - metoprolol succinate (TOPROL-XL) 100 MG 24 hr tablet; Take 1 tablet (100 mg total) by mouth daily. Take with or immediately following a meal.  Dispense: 30 tablet; Refill: 2  2. Infection of urinary tract - POCT urinalysis dipstick. Pos. Nitrites. Increase water intake. Vit C 500 mg twice daily. - nitrofurantoin, macrocrystal-monohydrate, (MACROBID) 100 MG capsule; Take 1 capsule (100 mg total) by mouth 2 (two) times daily.  Dispense: 14 capsule; Refill: 0

## 2013-04-28 ENCOUNTER — Encounter: Payer: Self-pay | Admitting: *Deleted

## 2013-05-24 ENCOUNTER — Ambulatory Visit: Payer: Self-pay | Admitting: Nurse Practitioner

## 2013-05-25 ENCOUNTER — Telehealth: Payer: Self-pay | Admitting: Nurse Practitioner

## 2013-05-25 NOTE — Telephone Encounter (Signed)
Rx sent in to pharmacy. 

## 2013-05-26 ENCOUNTER — Other Ambulatory Visit: Payer: Self-pay | Admitting: *Deleted

## 2013-05-26 NOTE — Telephone Encounter (Signed)
Pharmacy stated that patient still has 1 refill left of HCTZ. (per Lowella Bandy)

## 2013-08-05 ENCOUNTER — Telehealth: Payer: Self-pay | Admitting: Emergency Medicine

## 2013-08-05 DIAGNOSIS — I10 Essential (primary) hypertension: Secondary | ICD-10-CM

## 2013-08-05 MED ORDER — HYDROCHLOROTHIAZIDE 25 MG PO TABS: 25.0000 mg | ORAL_TABLET | Freq: Every day | ORAL | Status: DC

## 2013-08-05 NOTE — Telephone Encounter (Signed)
I had to reschedule this patient from her 08-18-13 because Layne would be at [facility] office. Her meds(hydrochlorothiazide) will need refill before she comes back in and she cant come until Sept. Please advise pt.

## 2013-08-18 ENCOUNTER — Ambulatory Visit: Payer: Self-pay | Admitting: Nurse Practitioner

## 2013-08-30 ENCOUNTER — Telehealth: Payer: Self-pay | Admitting: *Deleted

## 2013-08-30 DIAGNOSIS — I10 Essential (primary) hypertension: Secondary | ICD-10-CM

## 2013-08-30 MED ORDER — METOPROLOL SUCCINATE ER 100 MG PO TB24: 100.0000 mg | ORAL_TABLET | Freq: Every day | ORAL | Status: DC

## 2013-08-30 NOTE — Telephone Encounter (Signed)
Patient called office requesting refill for metoprolol. Rx sent into pharmacy.

## 2013-09-09 ENCOUNTER — Ambulatory Visit: Payer: Self-pay | Admitting: Nurse Practitioner

## 2013-09-22 ENCOUNTER — Ambulatory Visit: Payer: Self-pay | Admitting: Nurse Practitioner

## 2013-10-12 ENCOUNTER — Ambulatory Visit: Payer: Self-pay | Admitting: Nurse Practitioner

## 2013-11-17 ENCOUNTER — Telehealth: Payer: Self-pay | Admitting: Nurse Practitioner

## 2013-11-17 DIAGNOSIS — I10 Essential (primary) hypertension: Secondary | ICD-10-CM

## 2013-11-17 MED ORDER — HYDROCHLOROTHIAZIDE 25 MG PO TABS: 25.0000 mg | ORAL_TABLET | Freq: Every day | ORAL | Status: DC

## 2013-11-17 NOTE — Telephone Encounter (Signed)
Patient is off work 12/10/13. She made an appt for that day.

## 2013-11-30 ENCOUNTER — Other Ambulatory Visit: Payer: Self-pay | Admitting: *Deleted

## 2013-11-30 DIAGNOSIS — I10 Essential (primary) hypertension: Secondary | ICD-10-CM

## 2013-12-01 MED ORDER — METOPROLOL SUCCINATE ER 100 MG PO TB24: 100.0000 mg | ORAL_TABLET | Freq: Every day | ORAL | Status: DC

## 2013-12-10 ENCOUNTER — Ambulatory Visit: Payer: Self-pay | Admitting: Nurse Practitioner

## 2014-02-18 ENCOUNTER — Telehealth: Payer: Self-pay | Admitting: Nurse Practitioner

## 2014-02-18 DIAGNOSIS — I10 Essential (primary) hypertension: Secondary | ICD-10-CM

## 2014-02-18 MED ORDER — HYDROCHLOROTHIAZIDE 25 MG PO TABS: 25.0000 mg | ORAL_TABLET | Freq: Every day | ORAL | Status: DC

## 2014-02-18 NOTE — Telephone Encounter (Signed)
Patient requesting [place] [facility] [place], patient will be out this weekend

## 2014-02-21 ENCOUNTER — Telehealth: Payer: Self-pay | Admitting: Nurse Practitioner

## 2014-02-21 NOTE — Telephone Encounter (Signed)
Relevant patient education mailed to patient.  

## 2014-03-02 ENCOUNTER — Other Ambulatory Visit: Payer: Self-pay | Admitting: Family Medicine

## 2014-03-02 DIAGNOSIS — I10 Essential (primary) hypertension: Secondary | ICD-10-CM

## 2014-03-02 MED ORDER — METOPROLOL SUCCINATE ER 100 MG PO TB24: 100.0000 mg | ORAL_TABLET | Freq: Every day | ORAL | Status: DC

## 2014-03-02 NOTE — Telephone Encounter (Signed)
Patient requesting metoprolol refill.  Patient last seen 04/22/13.  Please advise refill.

## 2014-03-02 NOTE — Telephone Encounter (Signed)
pls call pt: Please notify pt she needs appt. By May. I will fill meds today, but not again until she has had exam & labs. It is not safe.

## 2014-03-08 ENCOUNTER — Ambulatory Visit (INDEPENDENT_AMBULATORY_CARE_PROVIDER_SITE_OTHER): Payer: 59 | Admitting: Nurse Practitioner

## 2014-03-08 ENCOUNTER — Telehealth: Payer: Self-pay | Admitting: Nurse Practitioner

## 2014-03-08 ENCOUNTER — Encounter: Payer: Self-pay | Admitting: Nurse Practitioner

## 2014-03-08 VITALS — BP 134/86 | HR 88 | Temp 98.0°F | Resp 18 | Ht 62.0 in | Wt 206.0 lb

## 2014-03-08 DIAGNOSIS — F419 Anxiety disorder, unspecified: Secondary | ICD-10-CM

## 2014-03-08 DIAGNOSIS — F411 Generalized anxiety disorder: Secondary | ICD-10-CM

## 2014-03-08 DIAGNOSIS — R002 Palpitations: Secondary | ICD-10-CM

## 2014-03-08 DIAGNOSIS — I10 Essential (primary) hypertension: Secondary | ICD-10-CM | POA: Insufficient documentation

## 2014-03-08 MED ORDER — HYDROCHLOROTHIAZIDE 25 MG PO TABS: 25.0000 mg | ORAL_TABLET | Freq: Every day | ORAL | Status: DC

## 2014-03-08 MED ORDER — METOPROLOL SUCCINATE ER 100 MG PO TB24: 100.0000 mg | ORAL_TABLET | Freq: Every day | ORAL | Status: DC

## 2014-03-08 NOTE — Assessment & Plan Note (Addendum)
CMET checked 10 mos ago. Will check labs again at CPE along with urine microalbumin. Fair control in ofc today. Recent weight loss of 12 lbs. Due to cutting out tea & less refined carbs. Motivated to exercise. Recommend 30 minutes daily.

## 2014-03-08 NOTE — Progress Notes (Signed)
Subjective:     Teresa Calderon is a 48 y.o. female. She presents for follow up of HTN. At last visit, she did not have health insurance. Labs were Normal. Now she has health ins & plans to schedule CPE in few months. We discussed her recurrent "heart palpitations" and anxiety.  The following portions of the patient's history were reviewed and updated as appropriate: allergies, current medications, past medical history, past social history, past surgical history and problem list.  Review of Systems Constitutional: negative for fatigue and fevers Respiratory: negative for cough and dyspnea on exertion Cardiovascular: positive for palpitations and occasional LE edema at end of day, negative for chest pain, chest pressure/discomfort, fatigue and near-syncope Musculoskeletal:occasional L sided rib pain when sweeps. Behavioral/Psych: positive for anxiety Endocrine: negative for diabetic symptoms including polydipsia, polyphagia and polyuria    Objective:    BP 134/86  Pulse 88  Temp(Src) 98 F (36.7 C) (Temporal)  Resp 18  Ht 5\' 2"  (1.575 m)  Wt 206 lb (93.441 kg)  BMI 37.67 kg/m2  SpO2 97%  LMP 03/01/2014 BP 134/86  Pulse 88  Temp(Src) 98 F (36.7 C) (Temporal)  Resp 18  Ht 5\' 2"  (1.575 m)  Wt 206 lb (93.441 kg)  BMI 37.67 kg/m2  SpO2 97%  LMP 03/01/2014 General appearance: alert, cooperative, appears stated age and no distress Head: Normocephalic, without obvious abnormality, atraumatic Eyes: negative findings: lids and lashes normal and conjunctivae and sclerae normal Lungs: clear to auscultation bilaterally Heart: regular rate and rhythm, S1, S2 normal, no murmur, click, rub or gallop Extremities: extremities normal, atraumatic, no cyanosis or edema Pulses: 2+ and symmetric    Assessment:     1. Essential hypertension, benign   2. Anxiety   3. Palpitations         Plan:     See problem list for A&P. See pt instructions.

## 2014-03-08 NOTE — Telephone Encounter (Signed)
Relevant patient education mailed to patient.  

## 2014-03-08 NOTE — Assessment & Plan Note (Signed)
Continues to have "flutter" sensation. Lasts a few seconds, not accompanied by symptoms. Occurs 1-2 times/week. Had cardio w/u 2 ya. Did not follow through with stress test.

## 2014-03-08 NOTE — Patient Instructions (Addendum)
Exercise goal is 30  Minutes daily-take a walk. Continue to cut out sugar. Great job with weight loss! Your blood pressure goal is under 140/90.   DASH Diet The DASH diet stands for "Dietary Approaches to Stop Hypertension." It is a healthy eating plan that has been shown to reduce high blood pressure (hypertension) in as little as 14 days, while also possibly providing other significant health benefits. These other health benefits include reducing the risk of breast cancer after menopause and reducing the risk of type 2 diabetes, heart disease, colon cancer, and stroke. Health benefits also include weight loss and slowing kidney failure in patients with chronic kidney disease.  DIET GUIDELINES  Limit salt (sodium). Your diet should contain less than 1500 mg of sodium daily.  Limit refined or processed carbohydrates. Your diet should include mostly whole grains. Desserts and added sugars should be used sparingly.  Include small amounts of heart-healthy fats. These types of fats include nuts, oils, and tub margarine. Limit saturated and trans fats. These fats have been shown to be harmful in the body. CHOOSING FOODS  The following food groups are based on a 2000 calorie diet. See your Registered Dietitian for individual calorie needs. Grains and Grain Products (6 to 8 servings daily)  Eat More Often: Whole-wheat bread, brown rice, whole-grain or wheat pasta, quinoa, popcorn without added fat or salt (air popped).  Eat Less Often: White bread, white pasta, white rice, cornbread. Vegetables (4 to 5 servings daily)  Eat More Often: Fresh, frozen, and canned vegetables. Vegetables may be raw, steamed, roasted, or grilled with a minimal amount of fat.  Eat Less Often/Avoid: Creamed or fried vegetables. Vegetables in a cheese sauce. Fruit (4 to 5 servings daily)  Eat More Often: All fresh, canned (in natural juice), or frozen fruits. Dried fruits without added sugar. One hundred percent fruit juice  ( cup [237 mL] daily).  Eat Less Often: Dried fruits with added sugar. Canned fruit in light or heavy syrup. Foot LockerLean Meats, Fish, and Poultry (2 servings or less daily. One serving is 3 to 4 oz [85-114 g]).  Eat More Often: Ninety percent or leaner ground beef, tenderloin, sirloin. Round cuts of beef, chicken breast, Mal[place]key breast. All fish. Grill, bake, or broil your meat. Nothing should be fried.  Eat Less Often/Avoid: Fatty cuts of meat, [place]key, or chicken leg, thigh, or wing. Fried cuts of meat or fish. Dairy (2 to 3 servings)  Eat More Often: Low-fat or fat-free milk, low-fat plain or light yogurt, reduced-fat or part-skim cheese.  Eat Less Often/Avoid: Milk (whole, 2%).Whole milk yogurt. Full-fat cheeses. Nuts, Seeds, and Legumes (4 to 5 servings per week)  Eat More Often: All without added salt.  Eat Less Often/Avoid: Salted nuts and seeds, canned beans with added salt. Fats and Sweets (limited)  Eat More Often: Vegetable oils, tub margarines without trans fats, sugar-free gelatin. Mayonnaise and salad dressings.  Eat Less Often/Avoid: Coconut oils, palm oils, butter, stick margarine, cream, half and half, cookies, candy, pie. FOR MORE INFORMATION The Dash Diet Eating Plan: www.dashdiet.org Document Released: 11/28/2011 Document Revised: 03/02/2012 Document Reviewed: 11/28/2011 [facility] Patient Information 2014 [place].

## 2014-03-08 NOTE — Assessment & Plan Note (Signed)
Pt states she "worries" a lot. Not using xanax. No panic attacks. Does not wish to take meds.

## 2014-03-08 NOTE — Progress Notes (Signed)
Pre visit review using our clinic review tool, if applicable. No additional management support is needed unless otherwise documented below in the visit note. 

## 2014-06-08 ENCOUNTER — Other Ambulatory Visit: Payer: 59

## 2014-06-14 ENCOUNTER — Encounter: Payer: 59 | Admitting: Nurse Practitioner

## 2014-07-08 ENCOUNTER — Telehealth: Payer: Self-pay | Admitting: Nurse Practitioner

## 2014-07-08 NOTE — Telephone Encounter (Signed)
Diabetic bundle LDL & A1C

## 2014-11-14 ENCOUNTER — Telehealth: Payer: Self-pay | Admitting: Nurse Practitioner

## 2014-11-14 DIAGNOSIS — I10 Essential (primary) hypertension: Secondary | ICD-10-CM

## 2014-11-14 MED ORDER — METOPROLOL SUCCINATE ER 100 MG PO TB24: 100.0000 mg | ORAL_TABLET | Freq: Every day | ORAL | Status: DC

## 2014-11-14 MED ORDER — HYDROCHLOROTHIAZIDE 25 MG PO TABS: 25.0000 mg | ORAL_TABLET | Freq: Every day | ORAL | Status: DC

## 2014-11-14 NOTE — Telephone Encounter (Signed)
[place] [facility] [place]

## 2014-12-02 NOTE — Telephone Encounter (Signed)
Appt 12/14/14

## 2014-12-14 ENCOUNTER — Telehealth: Payer: Self-pay | Admitting: Nurse Practitioner

## 2014-12-14 ENCOUNTER — Ambulatory Visit (INDEPENDENT_AMBULATORY_CARE_PROVIDER_SITE_OTHER): Payer: 59 | Admitting: Nurse Practitioner

## 2014-12-14 ENCOUNTER — Encounter: Payer: Self-pay | Admitting: Nurse Practitioner

## 2014-12-14 VITALS — BP 145/82 | HR 67 | Temp 97.4°F | Ht 62.0 in | Wt 185.0 lb

## 2014-12-14 DIAGNOSIS — R7309 Other abnormal glucose: Secondary | ICD-10-CM

## 2014-12-14 DIAGNOSIS — R300 Dysuria: Secondary | ICD-10-CM

## 2014-12-14 DIAGNOSIS — R002 Palpitations: Secondary | ICD-10-CM

## 2014-12-14 DIAGNOSIS — Z0000 Encounter for general adult medical examination without abnormal findings: Secondary | ICD-10-CM

## 2014-12-14 DIAGNOSIS — Z1239 Encounter for other screening for malignant neoplasm of breast: Secondary | ICD-10-CM

## 2014-12-14 DIAGNOSIS — I10 Essential (primary) hypertension: Secondary | ICD-10-CM

## 2014-12-14 LAB — COMPREHENSIVE METABOLIC PANEL
ALBUMIN: 4 g/dL (ref 3.5–5.2)
ALT: 26 U/L (ref 0–35)
AST: 25 U/L (ref 0–37)
Alkaline Phosphatase: 76 U/L (ref 39–117)
BUN: 16 mg/dL (ref 6–23)
CALCIUM: 9.5 mg/dL (ref 8.4–10.5)
CHLORIDE: 105 meq/L (ref 96–112)
CO2: 30 mEq/L (ref 19–32)
Creatinine, Ser: 0.5 mg/dL (ref 0.4–1.2)
GFR: 157.36 mL/min (ref >60.00)
Glucose, Bld: 93 mg/dL (ref 70–99)
POTASSIUM: 4 meq/L (ref 3.5–5.1)
SODIUM: 140 meq/L (ref 135–145)
TOTAL PROTEIN: 6.9 g/dL (ref 6.0–8.3)
Total Bilirubin: 0.6 mg/dL (ref 0.2–1.2)

## 2014-12-14 LAB — CBC WITH DIFFERENTIAL/PLATELET
BASOS PCT: 2.9 % (ref 0.0–3.0)
Basophils Absolute: 0.3 10*3/uL — ABNORMAL HIGH (ref 0.0–0.1)
EOS ABS: 0.3 10*3/uL (ref 0.0–0.7)
Eosinophils Relative: 2.3 % (ref 0.0–5.0)
HCT: 48 % — ABNORMAL HIGH (ref 36.0–46.0)
Hemoglobin: 15.9 g/dL — ABNORMAL HIGH (ref 12.0–15.0)
Lymphocytes Relative: 33.3 % (ref 12.0–46.0)
Lymphs Abs: 3.6 10*3/uL (ref 0.7–4.0)
MCHC: 33.2 g/dL (ref 30.0–36.0)
MCV: 89.8 fl (ref 78.0–100.0)
Monocytes Absolute: 0.7 10*3/uL (ref 0.1–1.0)
Monocytes Relative: 6.3 % (ref 3.0–12.0)
NEUTROS PCT: 55.2 % (ref 43.0–77.0)
Neutro Abs: 6 10*3/uL (ref 1.4–7.7)
PLATELETS: 259 10*3/uL (ref 150.0–400.0)
RBC: 5.34 Mil/uL — AB (ref 3.87–5.11)
RDW: 12.7 % (ref 11.5–15.5)
WBC: 10.9 10*3/uL — ABNORMAL HIGH (ref 4.0–10.5)

## 2014-12-14 LAB — VITAMIN D 25 HYDROXY (VIT D DEFICIENCY, FRACTURES): VITD: 22.31 ng/mL — AB (ref 30.00–100.00)

## 2014-12-14 LAB — MICROALBUMIN / CREATININE URINE RATIO
Creatinine,U: 163.7 mg/dL
MICROALB UR: 2.3 mg/dL — AB (ref 0.0–1.9)
Microalb Creat Ratio: 1.4 mg/g (ref 0.0–30.0)

## 2014-12-14 LAB — LIPID PANEL
CHOL/HDL RATIO: 4
Cholesterol: 230 mg/dL — ABNORMAL HIGH (ref 0–200)
HDL: 56.8 mg/dL (ref >39.00)
LDL CALC: 148 mg/dL — AB (ref 0–99)
NONHDL: 173.2
TRIGLYCERIDES: 125 mg/dL (ref 0.0–149.0)
VLDL: 25 mg/dL (ref 0.0–40.0)

## 2014-12-14 LAB — VITAMIN B12: VITAMIN B 12: 448 pg/mL (ref 211–911)

## 2014-12-14 LAB — T4, FREE: Free T4: 0.83 ng/dL (ref 0.60–1.60)

## 2014-12-14 LAB — HEMOGLOBIN A1C: Hgb A1c MFr Bld: 5.7 % (ref 4.6–6.5)

## 2014-12-14 LAB — TSH: TSH: 0.91 u[IU]/mL (ref 0.35–4.50)

## 2014-12-14 MED ORDER — HYDROCHLOROTHIAZIDE 25 MG PO TABS: 25.0000 mg | ORAL_TABLET | Freq: Every day | ORAL | Status: DC

## 2014-12-14 MED ORDER — METOPROLOL SUCCINATE ER 100 MG PO TB24: 100.0000 mg | ORAL_TABLET | Freq: Every day | ORAL | Status: DC

## 2014-12-14 NOTE — Patient Instructions (Signed)
Please see cardiology.  Please get mammogram.  My office will call with lab results.  Please schedule physical for Pap & breast exam and discussion for smoking cessation.  Nice to see you!  Merry Christmas!

## 2014-12-14 NOTE — Telephone Encounter (Signed)
pls call pt: Advise Labs look good except vitamin D too low. Start D3 5000 iu daily with meal. Still waiting on urine culture results. [facility]

## 2014-12-14 NOTE — Progress Notes (Signed)
Pre visit review using our clinic review tool, if applicable. No additional management support is needed unless otherwise documented below in the visit note. 

## 2014-12-15 NOTE — Telephone Encounter (Signed)
Patient notified of results. Patient scheduled appt. 

## 2014-12-17 ENCOUNTER — Telehealth: Payer: Self-pay | Admitting: Nurse Practitioner

## 2014-12-17 DIAGNOSIS — N3000 Acute cystitis without hematuria: Secondary | ICD-10-CM

## 2014-12-17 MED ORDER — NITROFURANTOIN MONOHYD MACRO 100 MG PO CAPS: 100.0000 mg | ORAL_CAPSULE | Freq: Two times a day (BID) | ORAL | Status: DC

## 2014-12-17 NOTE — Telephone Encounter (Signed)
Urine cx pos for e coli. Pt reported low abd discomfort & had suprapubic tenderness on exam. Start macrobid for 5 days.

## 2014-12-17 NOTE — Assessment & Plan Note (Signed)
Last A1C 5.8 No symptoms DM A1C today

## 2014-12-17 NOTE — Assessment & Plan Note (Signed)
Fair control RF: smoker, sedentary, obese C/o daily heart palps Encouraged smoking cessation-contemplating Labs today Continue meds

## 2014-12-17 NOTE — Progress Notes (Signed)
Subjective:     Teresa FrameJanice Calderon is a 48 y.o. female presents for f/u of HTN. She reports daily palpitations that "worry" her: "fears she might fall over dead one day"; and symptoms of UTI-dysuria. She is smoker & would like to quit. In reviewing health maintenance: she has never had MMG. HTN: fair control. RF: smoker, dyslipidemia, sedentary, overweight. No SE of meds. Palps: daily, denies SOB or CP. She was evaluated by cardiology but did not complete w/u w/Holter monitor.She desires to be evaluated again. Smoker: wants to quit, heard "bad" things about chantix. Discussed potential SE & other meds. Pt will consider. Hea Maint: pt is hesitant to heve MMG, but agrees to schedule. Recommend [facility] & [facility]. Dysuria: every urination for about 1 week. Denies back pain, fever, nausea. Elevated A1C: in review of last labs, A1C was elevated-5.8 & 7.0. Denies polyphagia, polyuria, or excessive thirst.  The following portions of the patient's history were reviewed and updated as appropriate: allergies, current medications, past medical history, past social history, past surgical history and problem list.  Review of Systems Constitutional: negative for fatigue, fevers and night sweats Eyes: negative for visual disturbance Respiratory: negative for cough, dyspnea on exertion and pleurisy/chest pain Cardiovascular: negative for chest pressure/discomfort, lower extremity edema and near-syncope Gastrointestinal: negative for abdominal pain, change in bowel habits, constipation, diarrhea and dyspepsia Genitourinary:negative for frequency and hematuria Behavioral/Psych: positive for anxiety, negative for excessive alcohol consumption and sleep disturbance    Objective:    BP 145/82 mmHg  Pulse 67  Temp(Src) 97.4 F (36.3 C) (Temporal)  Ht 5\' 2"  (1.575 m)  Wt 185 lb (83.915 kg)  BMI 33.83 kg/m2  SpO2 99% BP 145/82 mmHg  Pulse 67  Temp(Src)  97.4 F (36.3 C) (Temporal)  Ht 5\' 2"  (1.575 m)  Wt 185 lb (83.915 kg)  BMI 33.83 kg/m2  SpO2 99% General appearance: alert, cooperative, appears stated age and no distress Head: Normocephalic, without obvious abnormality, atraumatic Eyes: negative findings: lids and lashes normal and conjunctivae and sclerae normal Neck: no adenopathy, no carotid bruit, supple, symmetrical, trachea midline and thyroid not enlarged, symmetric, no tenderness/mass/nodules Lungs: clear to auscultation bilaterally Heart: regular rate and rhythm, S1, S2 normal, no murmur, click, rub or gallop Abdomen: normal findings: no masses palpable and no organomegaly and abnormal findings:  suprapubic tenderness Extremities: extremities normal, atraumatic, no cyanosis or edema Pulses: 2+ and symmetric    Assessment:Plan      1. Essential hypertension, benign, chronic, stable - metoprolol succinate (TOPROL-XL) 100 MG 24 hr tablet; Take 1 tablet (100 mg total) by mouth daily. Take with or immediately following a meal.  Dispense: 30 tablet; Refill: 5 - hydrochlorothiazide (HYDRODIURIL) 25 MG tablet; Take 1 tablet (25 mg total) by mouth daily.  Dispense: 30 tablet; Refill: 5 - CBC with Differential - Comprehensive metabolic panel - Lipid panel - Microalbumin / creatinine urine ratio 2. Dysuria, new - POCT urinalysis dipstick -urine culture -pending  3. Preventative health care - Lipid panel - Vit D  25 hydroxy (rtn osteoporosis monitoring) - Vitamin B12  4. Breast cancer screening - MM DIGITAL SCREENING BILATERAL; Future  5. Palpitations, chronic, requires further w/u - Ambulatory referral to Cardiology - TSH - T4, free  6. Elevated hemoglobin A1c, chronic, monitor - Hemoglobin A1c  Spent 25 minutes w/pt.

## 2014-12-18 LAB — URINE CULTURE: Colony Count: >=100000

## 2015-01-06 ENCOUNTER — Telehealth: Payer: Self-pay | Admitting: Nurse Practitioner

## 2015-01-06 NOTE — Telephone Encounter (Signed)
Tried to contact patient to schedule mammo & bone density. No answer & no VM.

## 2015-01-09 ENCOUNTER — Ambulatory Visit (HOSPITAL_COMMUNITY): Payer: 59

## 2015-01-18 ENCOUNTER — Institutional Professional Consult (permissible substitution): Payer: 59 | Admitting: Cardiology

## 2015-01-20 ENCOUNTER — Ambulatory Visit: Payer: 59 | Admitting: Nurse Practitioner

## 2015-01-20 NOTE — Telephone Encounter (Signed)
SW patient, she is going to call [facility] & schedule after 01/23/15

## 2015-01-31 ENCOUNTER — Encounter: Payer: Self-pay | Admitting: Nurse Practitioner

## 2015-01-31 ENCOUNTER — Other Ambulatory Visit (HOSPITAL_COMMUNITY)
Admission: RE | Admit: 2015-01-31 | Discharge: 2015-01-31 | Disposition: A | Discharge status: Home / Self Care | Payer: 59 | Source: Ambulatory Visit | Attending: Nurse Practitioner | Admitting: Nurse Practitioner

## 2015-01-31 ENCOUNTER — Ambulatory Visit (INDEPENDENT_AMBULATORY_CARE_PROVIDER_SITE_OTHER): Payer: 59 | Admitting: Nurse Practitioner

## 2015-01-31 VITALS — BP 133/85 | HR 83 | Temp 97.9°F | Resp 16 | Ht 62.0 in | Wt 190.0 lb

## 2015-01-31 DIAGNOSIS — Z01419 Encounter for gynecological examination (general) (routine) without abnormal findings: Secondary | ICD-10-CM | POA: Insufficient documentation

## 2015-01-31 DIAGNOSIS — Z124 Encounter for screening for malignant neoplasm of cervix: Secondary | ICD-10-CM

## 2015-01-31 DIAGNOSIS — Z87440 Personal history of urinary (tract) infections: Secondary | ICD-10-CM

## 2015-01-31 DIAGNOSIS — Z1151 Encounter for screening for human papillomavirus (HPV): Secondary | ICD-10-CM | POA: Insufficient documentation

## 2015-01-31 DIAGNOSIS — E559 Vitamin D deficiency, unspecified: Secondary | ICD-10-CM

## 2015-01-31 DIAGNOSIS — Z23 Encounter for immunization: Secondary | ICD-10-CM

## 2015-01-31 DIAGNOSIS — R7303 Prediabetes: Secondary | ICD-10-CM | POA: Insufficient documentation

## 2015-01-31 DIAGNOSIS — R7309 Other abnormal glucose: Secondary | ICD-10-CM

## 2015-01-31 NOTE — Progress Notes (Signed)
Pre visit review using our clinic review tool, if applicable. No additional management support is needed unless otherwise documented below in the visit note. 

## 2015-01-31 NOTE — Patient Instructions (Signed)
My office will call with lab results.  Please complete mammogram.  See cardiology.  Continue blood pressure medicines. Develop lifelong habits of exercise most days of the week: take a 30 minute walk. The benefits include weight loss, lower risk for heart disease, diabetes, stroke, high blood pressure, lower rates of depression & dementia, better sleep quality & bone health.  Take 5000 iu D3 daily with meal. I will check level in 6 months.  Decrease risk for diabetes by cutting out refined sugar:anything that is sweet when you eat or drink it except fresh fruit. Cut out white bread, rolls, biscuits, bagels, muffins, pasta and cereals. Breads & cereals that have 4 gm or more of fiber per serving are good. Whole wheat pasta, brown rice and quinoa are good.  Nice to see you!

## 2015-01-31 NOTE — Progress Notes (Signed)
Subjective:     Teresa FrameJanice Calderon is a 49 y.o. female and is here for a well woman exam w/pelvic exam; we discussed recent labs- she is vit D deficient & prediabetic; and still having cloudy urine. Well woman: same partner for 13 yrs. No birth control-thinks husband is not fertile as they have never used birth control & he had 2 wives prior with no children. Her MC has become irregular in the last 6 mos & she has had no MC in 4 mos. She had 1 spotting episode last mo. She had abnormal pap in past w/normal follow up. Thinks last Pap was 4 yrs ago. Never had MMG & does not perform self-breast exams. Mat aunt w/breast ca in old age. She does not exercise. Drinks unsweet tea & diet soda. Recent labs show prediabetes & vit d deficicency. She is taking D3 supplement without SE. Prediabetes: prev Hx of A1c of 7.0. Last 2 have been under 6.0. We discussed diet changes that will keep risks low.  She is due for Tdap today.  History   Social History  . Marital Status: Married    Spouse Name: N/A    Number of Children: N/A  . Years of Education: N/A   Occupational History  . Not on file.   Social History Main Topics  . Smoking status: Current Every Day Smoker -- 0.50 packs/day for 20 years    Types: Cigarettes  . Smokeless tobacco: Never Used  . Alcohol Use: No  . Drug Use: No  . Sexual Activity:    Partners: Male   Other Topics Concern  . Not on file   Social History Narrative   Lives with husband and son.   Health Maintenance  Topic Date Due  . INFLUENZA VACCINE  08/22/2015 (Originally 07/23/2014)  . MAMMOGRAM  02/01/2016 (Originally 11/26/1983)  . HEMOGLOBIN A1C  06/15/2015  . LIPID PANEL  12/15/2015  . URINE MICROALBUMIN  12/15/2015  . PAP SMEAR  01/31/2018  . TETANUS/TDAP  01/31/2025    The following portions of the patient's history were reviewed and updated as appropriate: allergies, current medications, past family history, past medical history, past social history, past surgical  history and problem list.  Review of Systems Respiratory: negative for cough Cardiovascular: negative for chest pressure/discomfort Genitourinary:negative for genital lesions and vaginal discharge   Objective:    BP 133/85 mmHg  Pulse 83  Temp(Src) 97.9 F (36.6 C) (Temporal)  Resp 16  Ht 5\' 2"  (1.575 m)  Wt 190 lb (86.183 kg)  BMI 34.74 kg/m2  SpO2 97% General appearance: alert, cooperative, appears stated age and no distress Head: Normocephalic, without obvious abnormality, atraumatic Eyes: negative findings: lids and lashes normal and conjunctivae and sclerae normal Neck: no carotid bruit, supple, symmetrical, trachea midline and thyroid not enlarged, symmetric, no tenderness/mass/nodules Lungs: clear to auscultation bilaterally Breasts: No nipple retraction or dimpling, No nipple discharge or bleeding, No axillary or supraclavicular adenopathy, Normal to palpation without dominant masses, Taught monthly breast self examination, patchy pink discoloration bilat w/scattered telangiectasia Heart: regular rate and rhythm, S1, S2 normal, no murmur, click, rub or gallop Pelvic: adnexae not palpable, cervix normal in appearance, external genitalia normal, no adnexal masses or tenderness, no bladder tenderness, no cervical motion tenderness, perianal skin: no external genital warts noted, urethra without abnormality or discharge, uterus normal size, shape, and consistency and vagina normal without discharge Lymph nodes: Cervical, supraclavicular, and axillary nodes normal. Neurologic: Grossly normal    Assessment:Plan     1.  Cervical cancer screening - Cytology - PAP  2. Recent urinary tract infection Still having cloudy urine - Urine culture  3. Well woman exam with routine gynecological exam  4. Immunization due - Tdap vaccine greater than or equal to 7yo IM  5. Vitamin D deficiency New Take 5000 iu qd, check level in 6 mos  6. Prediabetes New Cut out refined sugar &  flours Increase exercise  F/u 6 mos  Get MMG, see cardiology

## 2015-02-01 LAB — URINE CULTURE: Colony Count: 8000

## 2015-02-02 ENCOUNTER — Telehealth: Payer: Self-pay | Admitting: Nurse Practitioner

## 2015-02-02 LAB — CYTOLOGY - PAP

## 2015-02-02 NOTE — Telephone Encounter (Signed)
pls call pt: Advise No UTI Pap normal, next pap in 5 years.

## 2015-02-02 NOTE — Telephone Encounter (Signed)
Patient notified of results.

## 2015-02-15 ENCOUNTER — Encounter: Payer: Self-pay | Admitting: Cardiology

## 2015-02-15 ENCOUNTER — Ambulatory Visit (INDEPENDENT_AMBULATORY_CARE_PROVIDER_SITE_OTHER): Payer: 59 | Admitting: Cardiology

## 2015-02-15 VITALS — BP 122/82 | HR 73 | Ht 62.0 in | Wt 192.0 lb

## 2015-02-15 DIAGNOSIS — R002 Palpitations: Secondary | ICD-10-CM

## 2015-02-15 NOTE — Progress Notes (Signed)
Cardiology Office Note   Date:  02/15/2015   ID:  Teresa Calderon, DOB Jan 13, 1966, MRN [id number]  PCP:  Kelle Darting, NP  Cardiologist:   Rollene Rotunda, MD   No chief complaint on file.     History of Present Illness: Teresa Calderon is a 49 y.o. female who presents for evaluation of palpitations. She has actually had these for years. She feels isolated skipped beats. They may come in clusters. She might happen for days at a time and then they might go away for weeks. There is no particular pattern to them. She does not describe sustained rapid rates. She does not describe associated symptoms such as nausea vomiting or diaphoresis. She doesn't have presyncope or syncope. She does not have chest pressure, neck or arm discomfort. She cannot bring these on.  She has an active job working in [employer] and this does not seem to bring it on. They do however make her quite anxious.    Past Medical History  Diagnosis Date  . Hyperlipidemia   . Hypertension   . Anxiety   . Hyperglycemia   . Tobacco abuse 01/26/2012  . Pharyngitis 01/27/2012  . Cervical cancer screening 02/19/2012  . Abnormal LFTs 02/23/2012    Past Surgical History  Procedure Laterality Date  . Cesarean section  1990     Current Outpatient Prescriptions  Medication Sig Dispense Refill  . cholecalciferol (VITAMIN D) 1000 UNITS tablet Take 5,000 Units by mouth daily.    . hydrochlorothiazide (HYDRODIURIL) 25 MG tablet Take 1 tablet (25 mg total) by mouth daily. 30 tablet 5  . metoprolol succinate (TOPROL-XL) 100 MG 24 hr tablet Take 1 tablet (100 mg total) by mouth daily. Take with or immediately following a meal. 30 tablet 5  . Multiple Vitamin (MULTIVITAMIN) tablet Take 1 tablet by mouth daily.     No current facility-administered medications for this visit.    Allergies:   Review of patient's allergies indicates no known allergies.    Social History:  The patient  reports that she has been smoking  Cigarettes.  She has a 10 pack-year smoking history. She has never used smokeless tobacco. She reports that she does not drink alcohol or use illicit drugs.   Family History:  The patient's family history includes COPD in her father; Cancer in her maternal uncle; Coronary artery disease (age of onset: 6) in her mother; Hyperlipidemia in her mother; Hypertension in her mother.    ROS:  Please see the history of present illness.   Otherwise, review of systems are positive for none.   All other systems are reviewed and negative.    PHYSICAL EXAM: VS:  BP 122/82 mmHg  Pulse 73  Ht  (1.575 m)  Wt 192 lb (87.091 kg)  BMI 35.11 kg/m2 , BMI Body mass index is 35.11 kg/(m^2). GENERAL:  Well appearing HEENT:  Pupils equal round and reactive, fundi not visualized, oral mucosa unremarkable NECK:  No jugular venous distention, waveform within normal limits, carotid upstroke brisk and symmetric, no bruits, no thyromegaly LYMPHATICS:  No cervical, inguinal adenopathy LUNGS:  Clear to auscultation bilaterally BACK:  No CVA tenderness CHEST:  Unremarkable HEART:  PMI not displaced or sustained,S1 and S2 within normal limits, no S3, no S4, no clicks, no rubs, no murmurs ABD:  Flat, positive bowel sounds normal in frequency in pitch, no bruits, no rebound, no guarding, no midline pulsatile mass, no hepatomegaly, no splenomegaly EXT:  2 plus pulses throughout,  no edema, no cyanosis no clubbing SKIN:  No rashes no nodules NEURO:  Cranial nerves II through XII grossly intact, motor grossly intact throughout PSYCH:  Cognitively intact, oriented to person place and time    EKG:  EKG is ordered today. The ekg ordered today demonstrates sinus rhythm, rate 73, axis within normal limits, intervals within normal limits, no acute ST-T wave changes.   Recent Labs: 12/14/2014: ALT 26; BUN 16; Creatinine 0.5; Hemoglobin 15.9*; Platelets 259.0; Potassium 4.0; Sodium 140; TSH 0.91    Lipid Panel      Component Value Date/Time   CHOL 230* 12/14/2014 1121   TRIG 125.0 12/14/2014 1121   HDL 56.80 12/14/2014 1121   CHOLHDL 4 12/14/2014 1121   VLDL 25.0 12/14/2014 1121   LDLCALC 148* 12/14/2014 1121   LDLDIRECT 135.9 01/27/2012 0818      Wt Readings from Last 3 Encounters:  02/15/15 192 lb (87.091 kg)  01/31/15 190 lb (86.183 kg)  12/14/14 185 lb (83.915 kg)      Other studies Reviewed: Additional studies/ records that were reviewed today include: Office records. Review of the above records demonstrates:  Please see elsewhere in the note.     ASSESSMENT AND PLAN:  PALPITATIONS:  I suspect PACs or PVCs. I don't strongly suspect structural heart disease but will get an echocardiogram. If this is normal she would prefer conservative therapy. We discussed possibly upping her dose of beta blocker or changing to a calcium channel blocker but she would prefer not to do this. She would however E no further symptoms worsen at which point I would likely pursue an event monitor.  WEIGHT:  The patient understands the need to lose weight with diet and exercise. We have discussed specific strategies for this.   Current medicines are reviewed at length with the patient today.  The patient does not have concerns regarding medicines.  The following changes have been made:  no change  Labs/ tests ordered today include: echo  No orders of the defined types were placed in this encounter.     Disposition:   FU with me as needed.     Signed, Rollene RotundaJames Shyana Kulakowski, MD  02/15/2015 3:26 PM    [facility]

## 2015-02-15 NOTE — Patient Instructions (Signed)
The current medical regimen is effective;  continue present plan and medications.  Your physician has requested that you have an echocardiogram. Echocardiography is a painless test that uses sound waves to create images of your heart. It provides your doctor with information about the size and shape of your heart and how well your heart's chambers and valves are working. This procedure takes approximately one hour. There are no restrictions for this procedure.  Follow up as needed.  Thank you for choosing [facility]!!     

## 2015-02-22 ENCOUNTER — Ambulatory Visit (HOSPITAL_COMMUNITY): Payer: 59 | Attending: Cardiovascular Disease | Admitting: Radiology

## 2015-02-22 DIAGNOSIS — R002 Palpitations: Secondary | ICD-10-CM | POA: Diagnosis present

## 2015-02-22 NOTE — Progress Notes (Signed)
Echocardiogram performed.  

## 2015-04-12 ENCOUNTER — Telehealth: Payer: Self-pay | Admitting: Nurse Practitioner

## 2015-04-12 NOTE — Telephone Encounter (Signed)
Reminder [facility]

## 2015-06-14 ENCOUNTER — Other Ambulatory Visit: Payer: Self-pay | Admitting: Nurse Practitioner

## 2015-06-14 DIAGNOSIS — Z0000 Encounter for general adult medical examination without abnormal findings: Secondary | ICD-10-CM

## 2015-06-14 MED ORDER — METOPROLOL SUCCINATE ER 100 MG PO TB24: 100.0000 mg | ORAL_TABLET | Freq: Every day | ORAL | Status: DC

## 2015-06-14 MED ORDER — HYDROCHLOROTHIAZIDE 25 MG PO TABS: 25.0000 mg | ORAL_TABLET | Freq: Every day | ORAL | Status: DC

## 2015-06-14 NOTE — Telephone Encounter (Signed)
Hydrochlorothiazide & Metoprolol [facility] [place]. Patient is on last dose of hydrochlorothiazide today.

## 2015-06-14 NOTE — Progress Notes (Signed)
Patient to CB to schedule follow up appointment for any further medication refills.  Pt aware, unable to schedule at this time.

## 2015-06-14 NOTE — Telephone Encounter (Signed)
Please advise refills 

## 2015-07-17 ENCOUNTER — Encounter: Payer: Self-pay | Admitting: Nurse Practitioner

## 2015-07-17 ENCOUNTER — Ambulatory Visit (INDEPENDENT_AMBULATORY_CARE_PROVIDER_SITE_OTHER): Payer: 59 | Admitting: Nurse Practitioner

## 2015-07-17 VITALS — BP 120/60 | HR 72 | Resp 12 | Wt 201.8 lb

## 2015-07-17 DIAGNOSIS — R7303 Prediabetes: Secondary | ICD-10-CM

## 2015-07-17 DIAGNOSIS — I10 Essential (primary) hypertension: Secondary | ICD-10-CM | POA: Diagnosis not present

## 2015-07-17 DIAGNOSIS — R7309 Other abnormal glucose: Secondary | ICD-10-CM

## 2015-07-17 MED ORDER — METOPROLOL SUCCINATE ER 100 MG PO TB24: 100.0000 mg | ORAL_TABLET | Freq: Every day | ORAL | Status: DC

## 2015-07-17 MED ORDER — HYDROCHLOROTHIAZIDE 25 MG PO TABS: 25.0000 mg | ORAL_TABLET | Freq: Every day | ORAL | Status: DC

## 2015-07-17 NOTE — Patient Instructions (Signed)
COntinue medications as directed.  Continue to cut out refined sugar: anything that is sweet when you eat or drink it, except fresh fruit. Cut out refined grains: white bread, rolls, biscuits, bagels, muffins, pasta and cereals. Choose grains with 4 gm or more of fiber per serving.   Develop lifelong habits of exercise most days of the week: take a 30 minute walk. The benefits include weight loss, lower risk for heart disease, diabetes, stroke, high blood pressure, lower rates of depression & dementia, better sleep quality & bone health.  Follow up in 6 months.  It has been a pleasure to partner with you in your healthcare!

## 2015-07-17 NOTE — Progress Notes (Signed)
Subjective:     Teresa Calderon is a 49 y.o. female presents for f/u htn & prediabetes. HTN: taking metoprolol & HCTZ. Well controlled, slight elevation of urine microalbumin. Not exercising, smokes. Encouraged wt loss-discussed diet changes & reg exercise. Reviewed cardiology note. Prediabetes: Encouraged wt loss-discussed diet changes & reg exercise. Pt declines labs today. Expresses a lot of anxiety regarding fear of having heart attack.  The following portions of the patient's history were reviewed and updated as appropriate: allergies, current medications, past medical history, past social history, past surgical history and problem list.  Review of Systems Pertinent items are noted in HPI.    Objective:    BP 120/60 mmHg  Pulse 72  Resp 12  Wt 201 lb 12.8 oz (91.536 kg)  LMP 04/17/2015 BP 120/60 mmHg  Pulse 72  Resp 12  Wt 201 lb 12.8 oz (91.536 kg)  LMP 04/17/2015 General appearance: alert, cooperative, appears stated age and no distress Eyes: negative findings: lids and lashes normal and conjunctivae and sclerae normal Lungs: clear to auscultation bilaterally Heart: regular rate and rhythm, S1, S2 normal, no murmur, click, rub or gallop Extremities: extremities normal, atraumatic, no cyanosis or edema Pulses: 2+ and symmetric Neurologic: Grossly normal    Assessment:    1. Essential hypertension, benign Encouraged wt loss-discussed diet changes & reg exercise. continue - hydrochlorothiazide (HYDRODIURIL) 25 MG tablet; Take 1 tablet (25 mg total) by mouth daily.  Dispense: 30 tablet; Refill: 5 - metoprolol succinate (TOPROL-XL) 100 MG 24 hr tablet; Take 1 tablet (100 mg total) by mouth daily. Take with or immediately following a meal.  Dispense: 30 tablet; Refill: 5  2. Prediabetes Encouraged wt loss-discussed diet changes & reg exercise. F/u 6 mos

## 2015-07-17 NOTE — Assessment & Plan Note (Signed)
Encouraged wt loss-discussed diet changes & reg exercise.

## 2015-08-01 ENCOUNTER — Ambulatory Visit: Payer: 59 | Admitting: Family Medicine

## 2015-09-06 ENCOUNTER — Ambulatory Visit: Payer: 59 | Admitting: Family Medicine

## 2016-01-11 ENCOUNTER — Other Ambulatory Visit: Payer: Self-pay | Admitting: Family Medicine

## 2016-01-11 ENCOUNTER — Other Ambulatory Visit: Payer: Self-pay | Admitting: *Deleted

## 2016-01-11 DIAGNOSIS — I10 Essential (primary) hypertension: Secondary | ICD-10-CM

## 2016-01-11 MED ORDER — METOPROLOL SUCCINATE ER 100 MG PO TB24: 100.0000 mg | ORAL_TABLET | Freq: Every day | ORAL | Status: DC

## 2016-01-11 MED ORDER — HYDROCHLOROTHIAZIDE 25 MG PO TABS: 25.0000 mg | ORAL_TABLET | Freq: Every day | ORAL | Status: DC

## 2016-01-11 NOTE — Telephone Encounter (Signed)
Left message for patient regarding refill sent for HCTZ and Metoprolol. 10 day supply sent patient to be seen 01/17/16 for additional refills.

## 2016-01-11 NOTE — Telephone Encounter (Signed)
Patient walked into office today stating she is going to run out of her Rx's.  I advised patient that I would need to get authorization from provider since she hasn't been seen since 07/17/15.  Patient does have an appointment scheduled with Dr. Claiborne Billings 01/17/16.  Please advise hctz and metoprolol refills.    Patient would like a call either way.  Thanks.

## 2016-01-11 NOTE — Telephone Encounter (Signed)
Patient given refill for 10 day supply of HCTZ and metoprolol to get through till patient appt on 01/17/16.

## 2016-01-17 ENCOUNTER — Encounter: Payer: Self-pay | Admitting: Family Medicine

## 2016-01-17 ENCOUNTER — Ambulatory Visit (INDEPENDENT_AMBULATORY_CARE_PROVIDER_SITE_OTHER): Payer: BC Managed Care – PPO | Admitting: Family Medicine

## 2016-01-17 ENCOUNTER — Ambulatory Visit: Payer: 59 | Admitting: Family Medicine

## 2016-01-17 VITALS — BP 131/85 | HR 86 | Temp 98.1°F | Resp 20 | Wt 216.5 lb

## 2016-01-17 DIAGNOSIS — I10 Essential (primary) hypertension: Secondary | ICD-10-CM

## 2016-01-17 DIAGNOSIS — M7521 Bicipital tendinitis, right shoulder: Secondary | ICD-10-CM | POA: Insufficient documentation

## 2016-01-17 DIAGNOSIS — E785 Hyperlipidemia, unspecified: Secondary | ICD-10-CM

## 2016-01-17 DIAGNOSIS — E559 Vitamin D deficiency, unspecified: Secondary | ICD-10-CM

## 2016-01-17 DIAGNOSIS — R002 Palpitations: Secondary | ICD-10-CM | POA: Diagnosis not present

## 2016-01-17 DIAGNOSIS — R7303 Prediabetes: Secondary | ICD-10-CM

## 2016-01-17 MED ORDER — VITAMIN D 1000 UNITS PO TABS: 1000.0000 [IU] | ORAL_TABLET | Freq: Every day | ORAL | Status: DC

## 2016-01-17 MED ORDER — NAPROXEN 500 MG PO TABS: 500.0000 mg | ORAL_TABLET | Freq: Two times a day (BID) | ORAL | Status: DC

## 2016-01-17 MED ORDER — METOPROLOL SUCCINATE ER 100 MG PO TB24: 100.0000 mg | ORAL_TABLET | Freq: Every day | ORAL | Status: DC

## 2016-01-17 MED ORDER — HYDROCHLOROTHIAZIDE 25 MG PO TABS: 25.0000 mg | ORAL_TABLET | Freq: Every day | ORAL | Status: DC

## 2016-01-17 NOTE — Patient Instructions (Signed)
Fasting labs and preventive CPE last week in February or first week in March, Refills on medications called in today Naproxen called in for shoulder pain. Take 5 days in a row, two times a day, then as needed. Use heat therpay, then start stretching if improved and no pain.    Bicipital Tendonitis Bicipital tendonitis refers to redness, soreness, and swelling (inflammation) or irritation of the bicep tendon. The biceps muscle is located between the elbow and shoulder of the inner arm. The tendon heads, similar to pieces of rope, connect the bicep muscle to the shoulder socket. They are called short head and long head tendons. When tendonitis occurs, the long head tendon is inflamed and swollen, and may be thickened or partially torn.  Bicipital tendonitis can occur with other problems as well, such as arthritis in the shoulder or acromioclavicular joints, tears in the tendons, or other rotator cuff problems.  CAUSES  Overuse of of the arms for overhead activities is the major cause of tendonitis. Many athletes, such as swimmers, baseball players, and tennis players are prone to bicipital tendonitis. Jobs that require manual labor or routine chores, especially chores involving overhead activities can result in overuse and tendonitis. SYMPTOMS Symptoms may include:  Pain in and around the front of the shoulder. Pain may be worse with overhead motion.  Pain or aching that radiates down the arm.  Clicking or shifting sensations in the shoulder. DIAGNOSIS Your caregiver may perform the following:  Physical exam and tests of the biceps and shoulder to observe range of motion, strength, and stability.  X-rays or magnetic resonance imaging (MRI) to confirm the diagnosis. In most common cases, these tests are not necessary. Since other problems may exist in the shoulder or rotator cuff, additional tests may be recommended. TREATMENT Treatment may include the following:  Medications  Your  caregiver may prescribe over-the-counter pain relievers.  Steroid injections, such as cortisone, may be recommended. These may help to reduce inflammation and pain.  Physical Therapy - Your caregiver may recommend gentle exercises with the arm. These can help restore strength and range of motion. They may be done at home or with a physical therapist's supervision and input.  Surgery - Arthroscopic or open surgery sometimes is necessary. Surgery may include:  Reattachment or repair of the tendon at the shoulder socket.  Removal of the damaged section of the tendon.  Anchoring the tendon to a different area of the shoulder (tenodesis). HOME CARE INSTRUCTIONS   Avoid overhead motion of the affected arm or any other motion that causes pain.  Take medication for pain as directed. Do not take these for more than 3 weeks, unless directed to do so by your caregiver.  Ice the affected area for 20 minutes at a time, 3-4 times per day. Place a towel on the skin over the painful area and the ice or cold pack over the towel. Do not place ice directly on the skin.  Perform gentle exercises at home as directed. These will increase strength and flexibility. PREVENTION  Modify your activities as much as possible to protect your arm. A physical therapist or sports medicine physician can help you understand options for safe motion.  Avoid repetitive overhead pulling, lifting, reaching, and throwing until your caregiver tells you it is ok to resume these activities. SEEK MEDICAL CARE IF:  Your pain worsens.  You have difficulty moving the affected arm.  You have trouble performing any of the self-care instructions. MAKE SURE YOU:   Understand  these instructions.  Will watch your condition.  Will get help right away if you are not doing well or get worse.   This information is not intended to replace advice given to you by your health care provider. Make sure you discuss any questions you have  with your health care provider.   Document Released: 01/11/2011 Document Revised: 03/02/2012 Document Reviewed: 06/28/2015 Elsevier Interactive Patient Education [employer]

## 2016-01-17 NOTE — Progress Notes (Signed)
Subjective:    Patient ID: Teresa Calderon, female    DOB: [birth year], 50 y.o.   MRN: [id number]  HPI  CC: Patient presents to meet new provider, discussed chronic medical conditions, with new complaint of right shoulder pain.  Hypertension: Patient states that her blood pressure has been stable over the last few years. She currently is prescribed Toprol XL 100 mg daily and HCTZ 25 mg daily. Patient reports compliance with his medications. She denies any medication side effects. She denies any chest pain, shortness of breath, syncope or lower extremity edema. Patient does admit to infrequent palpitations, which are concerning for her because she is fearful that she will have a heart attack. Patient has had cardiology referral and workup, February 2016 with Dr. Antoine Poche, which demonstrated normal sinus rhythm, heart rate 73, axis within normal limits, intervals within normal limits, no ST changes on EKG. It was suspected patient was having PACs or PVCs. 2-D echocardiogram without contrast was completed on 02/22/2015 with an ejection fraction of 55-60% normal wall motion, normal echocardiogram. Patient continues smoking daily. Family history of hypertension, coronary artery disease and hyperlipidemia. She does have elevated cholesterol on last check in December 2015, cholesterol 230, triglycerides 125, LDL 48, HDL 56. Patient does not take a daily aspirin. Patient is not on cholesterol lowering medication or take supplementation. She does not exercise routinely. She states she does watch the salt content in her diet. Patient did have mildly elevated microalbumin in 2015 of 2.3, she is not on a ACE inhibitor. Patient wonders why she is on metoprolol and if it could be causing her palpitations.  Vitamin D deficiency: Patient with history vitamin D deficiency, last vitamin D was checked December 2015. Patient was supplemented with 5000 units daily with level 22. Patient states that she has been taking 5000 units  intermittently since that time. She is concerned because the pharmacist told her that she should not be taking that much vitamin D daily. Patient has not had her vitamin D level rechecked after supplementation. She states after the pharmacist told her she should not be taking that much, she started taking 5000 units every few days.   Right shoulder pain: Patient complains today of right shoulder pain of one-month duration. Patient points to her shoulder joint as the location of pain. She states the pain is worse and cause a sharp shooting pain when lifting objects. Patient denies any numbness, tingling or radiation of pain. Patient reports she has never injured this shoulder, or has had surgery on the shoulder prior. Patient works for [employer] at [employer], and lifts heavy trays. She has not tried any over-the-counter therapy for pain relief.   Past Medical History  Diagnosis Date  . Hyperlipidemia   . Hypertension   . Anxiety   . Hyperglycemia   . Tobacco abuse 01/26/2012  . Pharyngitis 01/27/2012  . Cervical cancer screening 02/19/2012  . Abnormal LFTs 02/23/2012   No Known Allergies Past Surgical History  Procedure Laterality Date  . Cesarean section  1990   Family History  Problem Relation Age of Onset  . Hypertension Mother   . Hyperlipidemia Mother   . Coronary artery disease Mother 76    Died in her sleep.  Not a proven MI.  No history prior of this.  Marland Kitchen COPD Father     smoked  . Cancer Maternal Uncle     breast   Social History   Social History  . Marital Status: Married  Spouse Name: N/A  . Number of Children: 1  . Years of Education: N/A   Occupational History  . Engineer, petroleum    Social History Main Topics  . Smoking status: Current Every Day Smoker -- 0.50 packs/day for 20 years    Types: Cigarettes  . Smokeless tobacco: Never Used  . Alcohol Use: No  . Drug Use: No  . Sexual Activity:    Partners: Male   Other Topics Concern  . Not on  file   Social History Narrative   Lives with husband and son.    Review of Systems Negative, with the exception of above mentioned in HPI     Objective:   Physical Exam BP 131/85 mmHg  Pulse 86  Temp(Src) 98.1 F (36.7 C) (Oral)  Resp 20  Wt 216 lb 8 oz (98.204 kg)  SpO2 98%  Body mass index is 39.59 kg/(m^2). Gen: Afebrile. No acute distress. Nontoxic in appearance, well-developed, well-nourished, obese Caucasian female. HENT: AT. [place]. MMM.  Eyes:Pupils Equal Round Reactive to light, Extraocular movements intact,  Conjunctiva without redness, discharge or icterus. Neck/lymp/endocrine: Supple, no lymphadenopathy, no thyromegaly CV: RRR, no edema, +2/4 P posterior tibialis pulses Chest: CTAB, no wheeze or crackles Abd: Soft. Round. NTND. BS present. No Masses palpated.  MSK/right shoulder: No erythema, no soft tissue swelling, tender to palpation over bicep tendon groove. Full range of motion of arms. Full range of motion of neck without discomfort. Positive speeds test, Negative empty can test, negative O'Brien's, negative Hawkins, negative liftoff. Neurovascularly intact distally. Skin: No rashes, purpura or petechiae.  Neuro: Normal gait. PERLA. EOMi. Alert. Oriented x3  Psych: Normal affect, dress and demeanor. Normal speech. Normal thought content and judgment.      Assessment & Plan:  Teresa Calderon is a 50 y.o. female present to meet new provider, follow-up on chronic medical conditions, with new complaint of right shoulder pain. 1. Biceps tendonitis on right - New Problem - Shoulder pain consistent with bicep tendinitis.  - Education and AVS was given to patient today on bicep tendinitis. - Patient was encouraged to use heat therapy, proximal and prescribed 3 times a day for the next 5-7 days and then when necessary. - encouraged to rest the shoulder, do not lift heavy objects for the next week, NSAID therapy, if improved after 1-2 weeks would have her start stretches  using pain as her guide. - naproxen (NAPROSYN) 500 MG tablet; Take 1 tablet (500 mg total) by mouth 2 (two) times daily with a meal.  Dispense: 60 tablet; Refill: 0 - If no improvement, would consider orthopedic referral versus physical therapy/TENS unit.  2. Essential hypertension, benign/palpitations - Chronic, controlled - Continue current therapy of metoprolol 100 mg and 25 mg of HCTZ daily. Discussed with patient today the use of metoprolol was likely due to elevated heart rate in the past, it is unlikely as the cause of her palpitations. Reassured patient today of her normal cardiac workup. - hydrochlorothiazide (HYDRODIURIL) 25 MG tablet; Take 1 tablet (25 mg total) by mouth daily.  Dispense: 30 tablet; Refill: 5 - metoprolol succinate (TOPROL-XL) 100 MG 24 hr tablet; Take 1 tablet (100 mg total) by mouth daily. Take with or immediately following a meal.  Dispense: 30 tablet; Refill: 5 - Refills on medications provided today, will need to check microalbumin on CPE next month, if he means elevated would consider adding low-dose lisinopril to her regimen.  3. Vitamin D deficiency - Encouraged patient to supplement 800 units to  1000 units of vitamin D daily. - Patient would like to wait until her other lab draw next month to have her vitamin D rechecked at that time.  CPE and a February/March 2017 (fasting lab orders placed to be collected prior to appt)

## 2016-02-19 ENCOUNTER — Other Ambulatory Visit (INDEPENDENT_AMBULATORY_CARE_PROVIDER_SITE_OTHER): Payer: BC Managed Care – PPO

## 2016-02-19 DIAGNOSIS — R7303 Prediabetes: Secondary | ICD-10-CM | POA: Diagnosis not present

## 2016-02-19 DIAGNOSIS — E559 Vitamin D deficiency, unspecified: Secondary | ICD-10-CM

## 2016-02-19 DIAGNOSIS — E785 Hyperlipidemia, unspecified: Secondary | ICD-10-CM | POA: Diagnosis not present

## 2016-02-19 DIAGNOSIS — I10 Essential (primary) hypertension: Secondary | ICD-10-CM

## 2016-02-19 LAB — CBC WITH DIFFERENTIAL/PLATELET
BASOS PCT: 0.4 % (ref 0.0–3.0)
Basophils Absolute: 0 10*3/uL (ref 0.0–0.1)
EOS PCT: 3.3 % (ref 0.0–5.0)
Eosinophils Absolute: 0.3 10*3/uL (ref 0.0–0.7)
HCT: 46.2 % — ABNORMAL HIGH (ref 36.0–46.0)
Hemoglobin: 16 g/dL — ABNORMAL HIGH (ref 12.0–15.0)
LYMPHS PCT: 38.8 % (ref 12.0–46.0)
Lymphs Abs: 3.4 10*3/uL (ref 0.7–4.0)
MCHC: 34.7 g/dL (ref 30.0–36.0)
MCV: 87.9 fl (ref 78.0–100.0)
Monocytes Absolute: 0.7 10*3/uL (ref 0.1–1.0)
Monocytes Relative: 8.4 % (ref 3.0–12.0)
NEUTROS PCT: 49.1 % (ref 43.0–77.0)
Neutro Abs: 4.4 10*3/uL (ref 1.4–7.7)
PLATELETS: 229 10*3/uL (ref 150.0–400.0)
RBC: 5.26 Mil/uL — ABNORMAL HIGH (ref 3.87–5.11)
RDW: 12.9 % (ref 11.5–15.5)
WBC: 8.9 10*3/uL (ref 4.0–10.5)

## 2016-02-19 LAB — LIPID PANEL
CHOLESTEROL: 185 mg/dL (ref 0–200)
HDL: 33 mg/dL — AB (ref >39.00)
Total CHOL/HDL Ratio: 6

## 2016-02-19 LAB — VITAMIN D 25 HYDROXY (VIT D DEFICIENCY, FRACTURES): VITD: 27.55 ng/mL — ABNORMAL LOW (ref 30.00–100.00)

## 2016-02-19 LAB — COMPREHENSIVE METABOLIC PANEL
ALT: 28 U/L (ref 0–35)
AST: 21 U/L (ref 0–37)
Albumin: 4.1 g/dL (ref 3.5–5.2)
Alkaline Phosphatase: 74 U/L (ref 39–117)
BILIRUBIN TOTAL: 0.3 mg/dL (ref 0.2–1.2)
BUN: 11 mg/dL (ref 6–23)
CO2: 29 meq/L (ref 19–32)
CREATININE: 0.48 mg/dL (ref 0.40–1.20)
Calcium: 9.1 mg/dL (ref 8.4–10.5)
Chloride: 100 mEq/L (ref 96–112)
GFR: 145.37 mL/min (ref >60.00)
GLUCOSE: 119 mg/dL — AB (ref 70–99)
Potassium: 3.5 mEq/L (ref 3.5–5.1)
Sodium: 139 mEq/L (ref 135–145)
Total Protein: 6.8 g/dL (ref 6.0–8.3)

## 2016-02-19 LAB — TSH: TSH: 1.1 u[IU]/mL (ref 0.35–4.50)

## 2016-02-19 LAB — MICROALBUMIN / CREATININE URINE RATIO
Creatinine,U: 182.5 mg/dL
Microalb Creat Ratio: 0.8 mg/g (ref 0.0–30.0)
Microalb, Ur: 1.5 mg/dL (ref 0.0–1.9)

## 2016-02-19 LAB — LDL CHOLESTEROL, DIRECT: Direct LDL: 86 mg/dL

## 2016-02-19 LAB — HEMOGLOBIN A1C: Hgb A1c MFr Bld: 6 % (ref 4.6–6.5)

## 2016-02-21 ENCOUNTER — Ambulatory Visit (INDEPENDENT_AMBULATORY_CARE_PROVIDER_SITE_OTHER): Payer: BC Managed Care – PPO | Admitting: Family Medicine

## 2016-02-21 ENCOUNTER — Encounter: Payer: Self-pay | Admitting: Family Medicine

## 2016-02-21 VITALS — BP 128/84 | HR 74 | Temp 98.4°F | Resp 20 | Ht 62.0 in | Wt 215.2 lb

## 2016-02-21 DIAGNOSIS — I10 Essential (primary) hypertension: Secondary | ICD-10-CM

## 2016-02-21 DIAGNOSIS — Z0000 Encounter for general adult medical examination without abnormal findings: Secondary | ICD-10-CM

## 2016-02-21 DIAGNOSIS — R7309 Other abnormal glucose: Secondary | ICD-10-CM

## 2016-02-21 DIAGNOSIS — E781 Pure hyperglyceridemia: Secondary | ICD-10-CM

## 2016-02-21 DIAGNOSIS — Z01419 Encounter for gynecological examination (general) (routine) without abnormal findings: Secondary | ICD-10-CM

## 2016-02-21 DIAGNOSIS — Z1239 Encounter for other screening for malignant neoplasm of breast: Secondary | ICD-10-CM

## 2016-02-21 DIAGNOSIS — R7303 Prediabetes: Secondary | ICD-10-CM | POA: Diagnosis not present

## 2016-02-21 DIAGNOSIS — E785 Hyperlipidemia, unspecified: Secondary | ICD-10-CM | POA: Insufficient documentation

## 2016-02-21 DIAGNOSIS — E559 Vitamin D deficiency, unspecified: Secondary | ICD-10-CM

## 2016-02-21 NOTE — Progress Notes (Signed)
Patient ID: Teresa Calderon, female   DOB: 04/08/1966, 50 y.o.   MRN: [id number]      Patient ID: Teresa Calderon, female  DOB: Aug 01, 1966, 50 y.o.   MRN: [id number]  Subjective:  Teresa Calderon is a 50 y.o. female present for preventive visit All past medical history, surgical history, allergies, family history, immunizations, medications and social history were updated in the electronic medical record today. All recent labs, ED visits and hospitalizations within the last year were reviewed.  Health maintenance:  Colonoscopy: indicated--> wants to wait until next year, no FHX Mammogram: never, indicated --> ordered today Cervical cancer screening: 2016 normal, with neg HPV, 3 year f/u Immunizations: Never wants the flu vac, Tdap UTD Infectious disease screening: HIV declined Assistive device: None  Oxygen YQM:VHQI  Patient has a Dental home. Dr. Christell Constant, in [place].  Hospitalizations/ED visits last 1 year: None    Past Medical History  Diagnosis Date  . Hyperlipidemia   . Hypertension   . Anxiety   . Hyperglycemia   . Tobacco abuse 01/26/2012  . Pharyngitis 01/27/2012  . Cervical cancer screening 02/19/2012  . Abnormal LFTs 02/23/2012   No Known Allergies Past Surgical History  Procedure Laterality Date  . Cesarean section  1990   Family History  Problem Relation Age of Onset  . Hypertension Mother   . Hyperlipidemia Mother   . Coronary artery disease Mother 60    Died in her sleep.  Not a proven MI.  No history prior of this.  Marland Kitchen COPD Father     smoked  . Breast cancer Maternal Aunt    Social History   Social History  . Marital Status: Married    Spouse Name: N/A  . Number of Children: 1  . Years of Education: N/A   Occupational History  . Engineer, petroleum    Social History Main Topics  . Smoking status: Current Every Day Smoker -- 0.50 packs/day for 20 years    Types: Cigarettes  . Smokeless tobacco: Never Used  . Alcohol Use: No  . Drug Use: No  . Sexual Activity:    Partners: Male   Other Topics Concern  . Not on file   Social History Narrative   Lives with husband Teresa Calderon)  and son.   HS grad, [employer].    Smoke detector in the home, wears her seat belt.    No firearms.    Feels safe in relationship.      ROS: Negative, with the exception of above mentioned in HPI  Objective: BP 128/84 mmHg  Pulse 74  Temp(Src) 98.4 F (36.9 C)  Resp 20  Ht  (1.575 m)  Wt 215 lb 4 oz (97.637 kg)  BMI 39.36 kg/m2  SpO2 97% Gen: Afebrile. No acute distress. Nontoxic in appearance, well-developed, well-nourished, female, obese female.  HENT: AT. [place]. Bilateral TM visualized and normal in appearance, normal external auditory canal. MMM, no oral lesions, Good dentition. Bilateral nares mild erythema, no bogginess. Throat without erythema, ulcerations or exudates. No Cough on exam, Nohoarseness on exam. Eyes:Pupils Equal Round Reactive to light, Extraocular movements intact,  Conjunctiva without redness, discharge or icterus. Neck/lymp/endocrine: Supple,No lymphadenopathy, No thyromegaly CV: RRR no m/c/g/r, No edema, +2/4 P posterior tibialis pulses. No carotid bruits. No JVD. Chest: CTAB, no wheeze, rhonchi or crackles. Normal  Respiratory effort. Good Air movement. Abd: Soft. obese. NTND. BS present. No Masses palpated. No hepatosplenomegaly. No rebound tenderness or guarding. Skin: No rashes, purpura or petechiae. Warm  and well-perfused. Skin intact. Neuro/Msk: Normal gait. PERLA. EOMi. Alert. Oriented x3.  Cranial nerves II through XII intact. Muscle strength 5/5 UE/LE extremity. DTRs equal bilaterally. Psych: Normal affect, dress and demeanor. Normal speech. Normal thought content and judgment.  Assessment/plan: Teresa Calderon is a 50 y.o. female present for annual exam.  1. Hypertriglyceridemia - counseled extensively on CV risk with, smoking, HTN, prediabetes and fhx with triglycerides 441! - Pt was fasting > 8 hours.  - exercise  > 150 m. A week, low saturated fat/fructose   - Fish oil/Krill oil start - Counseled on ways to lower triglycerides and raise HDL.   - Lipid panel; Future--> 3 mos lab appt  2. Vitamin D deficiency - Low on re-test ~27, supplement with 5000 u M-F  3. Prediabetes - A1c 6.0 , fasting glucose 119.  - counseled on diet and exercise - retest in 6 mos. With provider appt.   4. HTN:  - Stable.  - continue HCTZ/metoprolol.   5. Preventive exam/health: Health maintenance:  Colonoscopy: indicated--> wants to wait until next year, no FHX Mammogram: never, indicated --> ordered today Cervical cancer screening: 2016 normal, with neg HPV, 3 year f/u Immunizations: Never wants the flu vac, Tdap UTD Infectious disease screening: HIV declined Assistive device: None  Oxygen YNW:GNFA  Patient has a Dental home. Dr. Christell Constant, in [place].  Hospitalizations/ED visits last 1 year: None  Patient was encouraged to exercise greater than 150 minutes a week. Patient was encouraged to choose a diet filled with fresh fruits and vegetables, and lean meats. AVS provided to patient today for education/recommendation on gender specific health and safety maintenance.  3 mos lab appt Return in about 6 months (around 08/23/2016), or chronic.  Electronically signed by: Felix Pacini, DO [facility]

## 2016-02-21 NOTE — Patient Instructions (Signed)
Triglycerides Test Triglycerides are a type of fat in your body. Your body naturally produces some triglycerides. More can accumulate when you eat more food than your body needs. Most triglycerides are stored in fatty tissues. They are released when the body needs energy. Triglycerides also circulate in your blood as part of a cholesterol particle known as very-low-density lipoprotein (VLDL). Cholesterol and triglycerides are often tested at the same time. That test is called a lipid profile. You may have this test as part of a routine physical exam. Health care providers recommend that adults have this test at least once every 5 years. High triglycerides can increase your risk for heart disease. You may need to have your triglycerides checked more often if you have other risk factors for heart disease. This test requires a sample of blood taken from a vein in your hand or arm. Some lipid profiles can be done on a portable testing device using only a drop of blood taken from your fingertip (finger stick). PREPARATION FOR TEST  Do not eat anything for 9-12 hours before the test as directed by your health care provider.  Do not consume alcohol for at least 24 hours before the test.  Ask your health care provider if you should stop taking your regular medicines. Many medicines and supplements can interfere with the results of the test.  Follow any other instructions given by your health care provider. RESULTS It is your responsibility to obtain your test results. Ask the lab or department performing the test when and how you will get your results. Contact your health care provider to discuss any questions you have about your results.  Triglycerides are usually measured in milligrams per deciliter (mg/dL). Range of Normal Values Ranges for normal values may vary among different labs and hospitals. You should always check with your health care provider after having lab work or other tests done to  discuss whether your values are considered within normal limits. The normal ranges for triglycerides are as follows:  Adults:  Female: 40-160 mg/dL or 0.45-1.81 mmol/L (SI units).  Female: 35-135 mg/dL or 0.40-1.52 mmol/L (SI units).  Children age 24 years or younger:  Female: 84-86 mg/dL.  Female: 32-99 mg/dL.  Children age 68-11 years:  Female: 31-108 mg/dL.  Female: 35-114 mg/dL.  Children age 680-15 years:  Female: 36-138 mg/dL.  Female: 41-138 mg/dL.  Children age 26-19 years:  Female: 40-163 mg/dL.  Female: 40-128 mg/dL. Meaning of Results Outside Normal Value Ranges If your triglyceride level is higher than the normal range, you have an increased risk for heart disease. Triglycerides can also be elevated in certain diseases, such as diabetes. If you have high triglycerides, your health care provider will likely recommend measures to reduce your triglycerides and your risk of heart disease. This can be done through:  Diet.  Exercise.  Medicines.  Lifestyle measures such as quitting smoking and limiting how much alcohol you drink.   This information is not intended to replace advice given to you by your health care provider. Make sure you discuss any questions you have with your health care provider.   Document Released: 01/11/2005 Document Revised: 12/30/2014 Document Reviewed: 03/31/2014 Elsevier Interactive Patient Education 2016 [place] Maintenance, Female Adopting a healthy lifestyle and getting preventive care can go a long way to promote health and wellness. Talk with your health care provider about what schedule of regular examinations is right for you. This is a good chance for you to check in with  your provider about disease prevention and staying healthy. In between checkups, there are plenty of things you can do on your own. Experts have done a lot of research about which lifestyle changes and preventive measures are most likely to keep you  healthy. Ask your health care provider for more information. WEIGHT AND DIET  Eat a healthy diet  Be sure to include plenty of vegetables, fruits, low-fat dairy products, and lean protein.  Do not eat a lot of foods high in solid fats, added sugars, or salt.  Get regular exercise. This is one of the most important things you can do for your health.  Most adults should exercise for at least 150 minutes each week. The exercise should increase your heart rate and make you sweat (moderate-intensity exercise).  Most adults should also do strengthening exercises at least twice a week. This is in addition to the moderate-intensity exercise.  Maintain a healthy weight  Body mass index (BMI) is a measurement that can be used to identify possible weight problems. It estimates body fat based on height and weight. Your health care provider can help determine your BMI and help you achieve or maintain a healthy weight.  For females 66 years of age and older:   A BMI below 18.5 is considered underweight.  A BMI of 18.5 to 24.9 is normal.  A BMI of 25 to 29.9 is considered overweight.  A BMI of 30 and above is considered obese.  Watch levels of cholesterol and blood lipids  You should start having your blood tested for lipids and cholesterol at 51 years of age, then have this test every 5 years.  You may need to have your cholesterol levels checked more often if:  Your lipid or cholesterol levels are high.  You are older than 51 years of age.  You are at high risk for heart disease.  CANCER SCREENING   Lung Cancer  Lung cancer screening is recommended for adults 27-53 years old who are at high risk for lung cancer because of a history of smoking.  A yearly low-dose CT scan of the lungs is recommended for people who:  Currently smoke.  Have quit within the past 15 years.  Have at least a 30-pack-year history of smoking. A pack year is smoking an average of one pack of cigarettes  a day for 1 year.  Yearly screening should continue until it has been 15 years since you quit.  Yearly screening should stop if you develop a health problem that would prevent you from having lung cancer treatment.  Breast Cancer  Practice breast self-awareness. This means understanding how your breasts normally appear and feel.  It also means doing regular breast self-exams. Let your health care provider know about any changes, no matter how small.  If you are in your 20s or 30s, you should have a clinical breast exam (CBE) by a health care provider every 1-3 years as part of a regular health exam.  If you are 84 or older, have a CBE every year. Also consider having a breast X-ray (mammogram) every year.  If you have a family history of breast cancer, talk to your health care provider about genetic screening.  If you are at high risk for breast cancer, talk to your health care provider about having an MRI and a mammogram every year.  Breast cancer gene (BRCA) assessment is recommended for women who have family members with BRCA-related cancers. BRCA-related cancers include:  Breast.  Ovarian.  Tubal.  Peritoneal cancers.  Results of the assessment will determine the need for genetic counseling and BRCA1 and BRCA2 testing. Cervical Cancer Your health care provider may recommend that you be screened regularly for cancer of the pelvic organs (ovaries, uterus, and vagina). This screening involves a pelvic examination, including checking for microscopic changes to the surface of your cervix (Pap test). You may be encouraged to have this screening done every 3 years, beginning at age 56.  For women ages 20-65, health care providers may recommend pelvic exams and Pap testing every 3 years, or they may recommend the Pap and pelvic exam, combined with testing for human papilloma virus (HPV), every 5 years. Some types of HPV increase your risk of cervical cancer. Testing for HPV may also be  done on women of any age with unclear Pap test results.  Other health care providers may not recommend any screening for nonpregnant women who are considered low risk for pelvic cancer and who do not have symptoms. Ask your health care provider if a screening pelvic exam is right for you.  If you have had past treatment for cervical cancer or a condition that could lead to cancer, you need Pap tests and screening for cancer for at least 20 years after your treatment. If Pap tests have been discontinued, your risk factors (such as having a new sexual partner) need to be reassessed to determine if screening should resume. Some women have medical problems that increase the chance of getting cervical cancer. In these cases, your health care provider may recommend more frequent screening and Pap tests. Colorectal Cancer  This type of cancer can be detected and often prevented.  Routine colorectal cancer screening usually begins at 51 years of age and continues through 51 years of age.  Your health care provider may recommend screening at an earlier age if you have risk factors for colon cancer.  Your health care provider may also recommend using home test kits to check for hidden blood in the stool.  A small camera at the end of a tube can be used to examine your colon directly (sigmoidoscopy or colonoscopy). This is done to check for the earliest forms of colorectal cancer.  Routine screening usually begins at age 32.  Direct examination of the colon should be repeated every 5-10 years through 51 years of age. However, you may need to be screened more often if early forms of precancerous polyps or small growths are found. Skin Cancer  Check your skin from head to toe regularly.  Tell your health care provider about any new moles or changes in moles, especially if there is a change in a mole's shape or color.  Also tell your health care provider if you have a mole that is larger than the size of  a pencil eraser.  Always use sunscreen. Apply sunscreen liberally and repeatedly throughout the day.  Protect yourself by wearing long sleeves, pants, a wide-brimmed hat, and sunglasses whenever you are outside. HEART DISEASE, DIABETES, AND HIGH BLOOD PRESSURE   High blood pressure causes heart disease and increases the risk of stroke. High blood pressure is more likely to develop in:  People who have blood pressure in the high end of the normal range (130-139/85-89 mm Hg).  People who are overweight or obese.  People who are African American.  If you are 17-56 years of age, have your blood pressure checked every 3-5 years. If you are 31 years of age or older, have your  blood pressure checked every year. You should have your blood pressure measured twice--once when you are at a hospital or clinic, and once when you are not at a hospital or clinic. Record the average of the two measurements. To check your blood pressure when you are not at a hospital or clinic, you can use:  An automated blood pressure machine at a pharmacy.  A home blood pressure monitor.  If you are between 76 years and 49 years old, ask your health care provider if you should take aspirin to prevent strokes.  Have regular diabetes screenings. This involves taking a blood sample to check your fasting blood sugar level.  If you are at a normal weight and have a low risk for diabetes, have this test once every three years after 51 years of age.  If you are overweight and have a high risk for diabetes, consider being tested at a younger age or more often. PREVENTING INFECTION  Hepatitis B  If you have a higher risk for hepatitis B, you should be screened for this virus. You are considered at high risk for hepatitis B if:  You were born in a country where hepatitis B is common. Ask your health care provider which countries are considered high risk.  Your parents were born in a high-risk country, and you have not been  immunized against hepatitis B (hepatitis B vaccine).  You have HIV or AIDS.  You use needles to inject street drugs.  You live with someone who has hepatitis B.  You have had sex with someone who has hepatitis B.  You get hemodialysis treatment.  You take certain medicines for conditions, including cancer, organ transplantation, and autoimmune conditions. Hepatitis C  Blood testing is recommended for:  Everyone born from [birth year] through 1966.  Anyone with known risk factors for hepatitis C. Sexually transmitted infections (STIs)  You should be screened for sexually transmitted infections (STIs) including gonorrhea and chlamydia if:  You are sexually active and are younger than 51 years of age.  You are older than 51 years of age and your health care provider tells you that you are at risk for this type of infection.  Your sexual activity has changed since you were last screened and you are at an increased risk for chlamydia or gonorrhea. Ask your health care provider if you are at risk.  If you do not have HIV, but are at risk, it may be recommended that you take a prescription medicine daily to prevent HIV infection. This is called pre-exposure prophylaxis (PrEP). You are considered at risk if:  You are sexually active and do not regularly use condoms or know the HIV status of your partner(s).  You take drugs by injection.  You are sexually active with a partner who has HIV. Talk with your health care provider about whether you are at high risk of being infected with HIV. If you choose to begin PrEP, you should first be tested for HIV. You should then be tested every 3 months for as long as you are taking PrEP.  PREGNANCY   If you are premenopausal and you may become pregnant, ask your health care provider about preconception counseling.  If you may become pregnant, take 400 to 800 micrograms (mcg) of folic acid every day.  If you want to prevent pregnancy, talk to your  health care provider about birth control (contraception). OSTEOPOROSIS AND MENOPAUSE   Osteoporosis is a disease in which the bones lose minerals and strength  with aging. This can result in serious bone fractures. Your risk for osteoporosis can be identified using a bone density scan.  If you are 42 years of age or older, or if you are at risk for osteoporosis and fractures, ask your health care provider if you should be screened.  Ask your health care provider whether you should take a calcium or vitamin D supplement to lower your risk for osteoporosis.  Menopause may have certain physical symptoms and risks.  Hormone replacement therapy may reduce some of these symptoms and risks. Talk to your health care provider about whether hormone replacement therapy is right for you.  HOME CARE INSTRUCTIONS   Schedule regular health, dental, and eye exams.  Stay current with your immunizations.   Do not use any tobacco products including cigarettes, chewing tobacco, or electronic cigarettes.  If you are pregnant, do not drink alcohol.  If you are breastfeeding, limit how much and how often you drink alcohol.  Limit alcohol intake to no more than 1 drink per day for nonpregnant women. One drink equals 12 ounces of beer, 5 ounces of wine, or 1 ounces of hard liquor.  Do not use street drugs.  Do not share needles.  Ask your health care provider for help if you need support or information about quitting drugs.  Tell your health care provider if you often feel depressed.  Tell your health care provider if you have ever been abused or do not feel safe at home.   This information is not intended to replace advice given to you by your health care provider. Make sure you discuss any questions you have with your health care provider.   Document Released: 06/24/2011 Document Revised: 12/30/2014 Document Reviewed: 11/10/2013 Elsevier Interactive Patient Education 2016 Elsevier Inc.    HDL:  this is the "good cholesterol". Your level was low, we like this part of the cholesterol to be higher. You could bring this up with eating foods such as olive oil, salmon, whole grains, beans/legumes and exercise.  Start Fish oil and [employer] at night.  DRink >80  Ounces of water a day. Take 5000 u Vit d M-F Retest cholesterol in 3 months Appt with provider 6 months.  >150 minutes a week of exercise

## 2016-05-23 ENCOUNTER — Other Ambulatory Visit (INDEPENDENT_AMBULATORY_CARE_PROVIDER_SITE_OTHER): Payer: BC Managed Care – PPO

## 2016-05-23 DIAGNOSIS — E781 Pure hyperglyceridemia: Secondary | ICD-10-CM

## 2016-05-23 LAB — LIPID PANEL
CHOL/HDL RATIO: 4
CHOLESTEROL: 197 mg/dL (ref 0–200)
HDL: 49.4 mg/dL (ref >39.00)
LDL CALC: 119 mg/dL — AB (ref 0–99)
NonHDL: 147.11
TRIGLYCERIDES: 139 mg/dL (ref 0.0–149.0)
VLDL: 27.8 mg/dL (ref 0.0–40.0)

## 2016-05-24 ENCOUNTER — Telehealth: Payer: Self-pay | Admitting: Family Medicine

## 2016-05-24 NOTE — Telephone Encounter (Signed)
Left message with patient results and instructions. Requested patient call back and let us know if she is taking fishoil/krill oil so we may update med list.

## 2016-05-24 NOTE — Telephone Encounter (Signed)
Please call pt: - her cholesterol panel looks much better. Triglycerides are now normal. Continue whatever she is doing. Please ask her what she is taking (advised fish il/krill oil etc after last collection) and add to her medlist. Thanks.

## 2016-07-15 ENCOUNTER — Other Ambulatory Visit: Payer: Self-pay | Admitting: Family Medicine

## 2016-07-15 DIAGNOSIS — I10 Essential (primary) hypertension: Secondary | ICD-10-CM

## 2016-07-17 ENCOUNTER — Other Ambulatory Visit: Payer: Self-pay | Admitting: Family Medicine

## 2016-07-17 DIAGNOSIS — I10 Essential (primary) hypertension: Secondary | ICD-10-CM

## 2016-08-15 ENCOUNTER — Ambulatory Visit: Payer: BC Managed Care – PPO | Admitting: Family Medicine

## 2016-08-15 ENCOUNTER — Other Ambulatory Visit: Payer: Self-pay | Admitting: Family Medicine

## 2016-08-15 ENCOUNTER — Ambulatory Visit (INDEPENDENT_AMBULATORY_CARE_PROVIDER_SITE_OTHER): Payer: BC Managed Care – PPO | Admitting: Family Medicine

## 2016-08-15 ENCOUNTER — Encounter: Payer: Self-pay | Admitting: Family Medicine

## 2016-08-15 VITALS — BP 136/88 | HR 81 | Temp 98.0°F | Resp 20 | Ht 62.0 in | Wt 195.5 lb

## 2016-08-15 DIAGNOSIS — I10 Essential (primary) hypertension: Secondary | ICD-10-CM | POA: Diagnosis not present

## 2016-08-15 DIAGNOSIS — R7303 Prediabetes: Secondary | ICD-10-CM

## 2016-08-15 DIAGNOSIS — R002 Palpitations: Secondary | ICD-10-CM

## 2016-08-15 DIAGNOSIS — E559 Vitamin D deficiency, unspecified: Secondary | ICD-10-CM | POA: Diagnosis not present

## 2016-08-15 LAB — VITAMIN D 25 HYDROXY (VIT D DEFICIENCY, FRACTURES): VITD: 32.48 ng/mL (ref 30.00–100.00)

## 2016-08-15 LAB — POCT GLYCOSYLATED HEMOGLOBIN (HGB A1C): HEMOGLOBIN A1C: 5.6

## 2016-08-15 MED ORDER — METOPROLOL SUCCINATE ER 25 MG PO TB24: 12.5000 mg | ORAL_TABLET | Freq: Every day | ORAL | 0 refills | Status: DC

## 2016-08-15 NOTE — Progress Notes (Signed)
Subjective:    Patient ID: Teresa Calderon Khaimov, female    DOB: 03/13/1966, 50 y.o.   MRN: [id number]  HPI  WU:[id number]:chronic medical condition 6 month follow up   Hypertension/hyperlipdemia: Patient states that her blood pressure has been stable over the last few years. She currently is prescribed Toprol XL 100 mg daily and HCTZ 25 mg daily. Patient reports compliance with his medications. She denies any medication side effects. She denies any chest pain, shortness of breath, syncope or lower extremity edema. She does continuously states that she has palpitations. She feels that these have been worse over the last week. Palpitations are more profound when laying down. Her mother died in her sleep and She is concerned this is going to happen to her. Patient has had cardiology referral and workup, February 2016 with Dr. Antoine PocheHochrein, which demonstrated normal sinus rhythm, heart rate 73, axis within normal limits, intervals within normal limits, no ST changes on EKG. It was suspected patient was having PACs or PVCs. 2-D echocardiogram without contrast was completed on 02/22/2015 with an ejection fraction of 55-60% normal wall motion, normal echocardiogram. Patient continues smoking daily. Family history of hypertension, coronary artery disease and hyperlipidemia. She did have elevated cholesterol that has responded nicely to fish oil supplement. Patient does not take a daily aspirin.  She does not exercise routinely. She states she does watch the salt content in her diet.   Vitamin D deficiency: Patient with history vitamin D deficiency. Patient was supplemented with prescribed vitamin D but was only taking it only intermittently. Patient now taking 400 units (she thinks) daily. She had not had a vitamin D recheck after prescribe supplement.    Past Medical History:  Diagnosis Date  . Abnormal LFTs 02/23/2012  . Anxiety   . Cervical cancer screening 02/19/2012  . Hyperglycemia   . Hyperlipidemia   . Hypertension   .  Pharyngitis 01/27/2012  . Tobacco abuse 01/26/2012   No Known Allergies Past Surgical History:  Procedure Laterality Date  . CESAREAN SECTION  1990   Family History  Problem Relation Age of Onset  . Hypertension Mother   . Hyperlipidemia Mother   . Coronary artery disease Mother [id number]    Died in her sleep.  Not a proven MI.  No history prior of this.  Marland Kitchen. COPD Father     smoked  . Breast cancer Maternal Aunt    Social History   Social History  . Marital status: Married    Spouse name: N/A  . Number of children: 1  . Years of education: N/A   Occupational History  . 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 Smoking status: Current Every Day Smoker    Packs/day: 0.50    Years: 20.00    Types: Cigarettes  . Smokeless tobacco: Never Used  . Alcohol use No  . Drug use: No  . Sexual activity: Yes    Partners: Male   Other Topics Concern  . Not on file   Social History Narrative   Lives with husband Loraine Leriche(Mark)  and son.   HS grad, [employer] cafeteria worker.    Smoke detector in the home, wears her seat belt.    No firearms.    Feels safe in relationship.     Review of Systems Negative, with the exception of above mentioned in HPI     Objective:   Physical Exam BP 136/88 (BP Location: Left Arm, Patient Position: Sitting, Cuff Size: Large)  Pulse 81   Temp 98 F (36.7 C)   Resp 20   Ht 5\' 2"  (1.575 m)   Wt 195 lb 8 oz (88.7 kg)   SpO2 96%   BMI 35.76 kg/m   Body mass index is 35.76 kg/m. Gen: Afebrile. No acute distress. Nontoxic in appearance, well-developed, well-nourished, obese Caucasian female. HENT: AT. [place]. MMM. Cough present on exam. Eyes:Pupils Equal Round Reactive to light, Extraocular movements intact,  Conjunctiva without redness, discharge or icterus. Neck/lymp/endocrine: Supple, no lymphadenopathy, no thyromegaly CV: RRR, no edema, +2/4 P posterior tibialis pulses Chest: Right lower lobe wheeze, otherwise clear to auscultation. Abd: Soft.  Round. NTND. BS present. No Masses palpated.  Skin: No rashes, purpura or petechiae.  Neuro: Normal gait. PERLA. EOMi. Alert. Oriented x3  Psych: Normal affect, dress and demeanor. Normal speech. Normal thought content and judgment.      Assessment & Plan:  Teresa Calderon Glendinning is a 50 y.o. female present For follow-up on chronic medical conditions.  Essential hypertension, benign/palpitations - Chronic. Patient seems to be greatly concerned about the palpitations, especially since increasing the last week. Discussed with patient we could slowly attempt to increase her metoprolol to see if we can get better control of her palpitations.  - Continue 25 mg of AST to see daily, increase metoprolol to 112.5 mg daily. Follow-up in one week for vital signs and provider visit. Discussed bradycardia and hypotension with patient. Attempted to reassure patient that she's had a normal cardiac workup for these symptoms. - Patient avoid caffeine - hydrochlorothiazide (HYDRODIURIL) 25 MG tablet; Take 1 tablet (25 mg total) by mouth daily.  Dispense: 30 tablet; Refill: 5 - metoprolol succinate (TOPROL-XL) 100 MG 24 hr tablet; Take 1 tablet (100 mg total) by mouth daily. Take with or immediately following a meal.  Dispense: 30 tablet; Refill: 5  Vitamin D deficiency - Low on re-test ~27, supplement with 400 u a day.  - vit d today recheck today.  Prediabetes - A1c 6.0--> 5.6  - counseled on diet and exercise  Of note: Patient appears to have an acute illness, offered to see her for this illness considering she was having wheezing in her lungs. She declined breathing treatment in the office for concerns of palpitations and she declined x-ray of the chest at this time. Advised her if she worsens in her symptoms she should be seen immediately.  > 25 minutes spent with patient, >50% of time spent face to face counseling patient and coordinating care.   Electronically Signed by: Felix Pacinienee Shontelle Muska, DO [facility]- [place]

## 2016-08-15 NOTE — Patient Instructions (Signed)
Start the add on metoprolol 1/2 of a pill, and follow up 1 week with me for vital signs and discussion if it is helping. We will slowly adjust this medicine to get better control of palpitations. Avoid caffeine intake.    Plain Claritin, zyrtec or allegra is ok for allergies.

## 2016-08-16 ENCOUNTER — Ambulatory Visit: Payer: BC Managed Care – PPO | Admitting: Family Medicine

## 2016-08-16 ENCOUNTER — Telehealth: Payer: Self-pay | Admitting: Family Medicine

## 2016-08-16 NOTE — Telephone Encounter (Signed)
Please call patient: Her vitamin D was low normal. She stated she was taking 400 units of vitamin D daily, will encourage her to increase that to 800 units daily.

## 2016-08-16 NOTE — Telephone Encounter (Signed)
Patient calling to report she was seen in the office yesterday and was supposed to have two refills sent in that were not.  Patient states she will be out of medication after today.  Please send refill authorization for the following medications:  metoprolol succinate (TOPROL-XL) 100 MG 24 hr tablet  hydrochlorothiazide (HYDRODIURIL) 25 MG tablet   Preferred Pharmacies      [facility] [address] - [place], [place] - [address]

## 2016-08-16 NOTE — Telephone Encounter (Signed)
Cation refill HCTZ and metoprolol sent to pharmacy.

## 2016-08-16 NOTE — Telephone Encounter (Signed)
Left message on patient voice mail with lab results and instructions.

## 2016-08-23 ENCOUNTER — Encounter: Payer: Self-pay | Admitting: Family Medicine

## 2016-08-23 ENCOUNTER — Ambulatory Visit (INDEPENDENT_AMBULATORY_CARE_PROVIDER_SITE_OTHER): Payer: BC Managed Care – PPO | Admitting: Family Medicine

## 2016-08-23 VITALS — BP 126/87 | HR 75 | Temp 98.1°F | Resp 20 | Wt 198.0 lb

## 2016-08-23 DIAGNOSIS — Z720 Tobacco use: Secondary | ICD-10-CM

## 2016-08-23 DIAGNOSIS — J209 Acute bronchitis, unspecified: Secondary | ICD-10-CM | POA: Diagnosis not present

## 2016-08-23 DIAGNOSIS — I10 Essential (primary) hypertension: Secondary | ICD-10-CM | POA: Diagnosis not present

## 2016-08-23 DIAGNOSIS — F419 Anxiety disorder, unspecified: Secondary | ICD-10-CM | POA: Diagnosis not present

## 2016-08-23 DIAGNOSIS — R002 Palpitations: Secondary | ICD-10-CM

## 2016-08-23 MED ORDER — DOXYCYCLINE HYCLATE 100 MG PO TABS: 100.0000 mg | ORAL_TABLET | Freq: Two times a day (BID) | ORAL | 0 refills | Status: DC

## 2016-08-23 MED ORDER — METOPROLOL SUCCINATE ER 25 MG PO TB24: 12.5000 mg | ORAL_TABLET | Freq: Every day | ORAL | 5 refills | Status: DC

## 2016-08-23 MED ORDER — METOPROLOL SUCCINATE ER 100 MG PO TB24: ORAL_TABLET | ORAL | 5 refills | Status: DC

## 2016-08-23 NOTE — Patient Instructions (Signed)
TAKE A TOTAL OF 125 MG OF METOPROLOL DAILY.  ANTIBIOTIC DOXYCYLINE 100 MG EVERY 12 HOURS FOR 10 DAYS.

## 2016-08-23 NOTE — Progress Notes (Signed)
Subjective:    Patient ID: Teresa Calderon, female    DOB: Apr 30, 1966, 50 y.o.   MRN: [id number]  HPI   Hypertension/palpitaitons:  Pt was seen last week and complained of more frequent palpitations. Metoprolol increased very mildly secondary to patients fear. She has tolerated 112.5 mg dose, and reports less frequent but continued palpitations. Her VSS stable.   Cough: pt is a smoker. She has been ill for > 2 weeks. She has a productive cough and wheeze. She is fearful of albuterol use --> palpitations. She denies fever, chills, nausea or vomit. She has been taking cough medicine when she remembers.   Prior note: Patient states that her blood pressure has been stable over the last few years. She currently is prescribed Toprol XL 100 mg daily and HCTZ 25 mg daily. Patient reports compliance with his medications. She denies any medication side effects. She denies any chest pain, shortness of breath, syncope or lower extremity edema. She does continuously states that she has palpitations. She feels that these have been worse over the last week. Palpitations are more profound when laying down. Her mother died in her sleep and She is concerned this is going to happen to her. Patient has had cardiology referral and workup, February 2016 with Dr. Antoine Poche, which demonstrated normal sinus rhythm, heart rate 73, axis within normal limits, intervals within normal limits, no ST changes on EKG. It was suspected patient was having PACs or PVCs. 2-D echocardiogram without contrast was completed on 02/22/2015 with an ejection fraction of 55-60% normal wall motion, normal echocardiogram. Patient continues smoking daily. Family history of hypertension, coronary artery disease and hyperlipidemia. She did have elevated cholesterol that has responded nicely to fish oil supplement. Patient does not take a daily aspirin.  She does not exercise routinely. She states she does watch the salt content in her diet.   Past Medical  History:  Diagnosis Date  . Abnormal LFTs 02/23/2012  . Anxiety   . Cervical cancer screening 02/19/2012  . Hyperglycemia   . Hyperlipidemia   . Hypertension   . Pharyngitis 01/27/2012  . Tobacco abuse 01/26/2012   No Known Allergies Past Surgical History:  Procedure Laterality Date  . CESAREAN SECTION  1990   Family History  Problem Relation Age of Onset  . Hypertension Mother   . Hyperlipidemia Mother   . Coronary artery disease Mother 44    Died in her sleep.  Not a proven MI.  No history prior of this.  Marland Kitchen COPD Father     smoked  . Breast cancer Maternal Aunt    Social History   Social History  . Marital status: Married    Spouse name: N/A  . Number of children: 1  . Years of education: N/A   Occupational History  . Engineer, petroleum    Social History Main Topics  . Smoking status: Current Every Day Smoker    Packs/day: 0.50    Years: 20.00    Types: Cigarettes  . Smokeless tobacco: Never Used  . Alcohol use No  . Drug use: No  . Sexual activity: Yes    Partners: Male   Other Topics Concern  . Not on file   Social History Narrative   Lives with husband Loraine Leriche)  and son.   HS grad, [employer].    Smoke detector in the home, wears her seat belt.    No firearms.    Feels safe in relationship.     Review of  Systems Negative, with the exception of above mentioned in HPI     Objective:   Physical Exam BP 126/87 (BP Location: Left Arm, Patient Position: Sitting, Cuff Size: Large)   Pulse 75   Temp 98.1 F (36.7 C)   Resp 20   Wt 198 lb (89.8 kg)   SpO2 95%   BMI 36.21 kg/m   Body mass index is 36.21 kg/m. Gen: Afebrile. No acute distress. Nontoxic in appearance, well-developed, well-nourished, obese Caucasian female. HENT: AT. [place]. MMM. Cough present on exam. Eyes:Pupils Equal Round Reactive to light, Extraocular movements intact,  Conjunctiva without redness, discharge or icterus. Neck/lymp/endocrine: Supple, no lymphadenopathy, no  thyromegaly CV: RRR, no edema, +2/4 P posterior tibialis pulses Chest: Left lower lobe wheeze, otherwise clear to auscultation. Abd: Soft. Round. NTND. BS present. No Masses palpated.  Skin: No rashes, purpura or petechiae.  Neuro: Normal gait. PERLA. EOMi. Alert. Oriented x3  Psych: Normal affect, dress and demeanor. Normal speech. Normal thought content and judgment.      Assessment & Plan:  Teresa Calderon is a 50 y.o. female present For follow-up on chronic medical conditions.  Essential hypertension, benign/palpitations - Chronic. Patient seems to be greatly concerned about the palpitations. She reports they are not quite as bad this week.  - Continue 25 mg of HTZC daily, increase metoprolol to 125 mg daily. Discussed bradycardia and hypotension with patient. Attempted to reassure patient that she's had a normal cardiac workup for these symptoms. - Patient to avoid caffeine; STOP smoking - F/U 6 months.   Bronchitis: - STOP smoking.  - Doxycyline 100 mg BID. Pt offered breathing treatment and declined.  - F/U 1 week if not improved.     > 25 minutes spent with patient, >50% of time spent face to face counseling patient and coordinating care.   Electronically Signed by: Felix Pacinienee Rei Contee, DO [facility] [place]

## 2016-09-11 ENCOUNTER — Telehealth: Payer: Self-pay | Admitting: Family Medicine

## 2016-09-11 DIAGNOSIS — I10 Essential (primary) hypertension: Secondary | ICD-10-CM

## 2016-09-11 MED ORDER — HYDROCHLOROTHIAZIDE 25 MG PO TABS
ORAL_TABLET | ORAL | 5 refills | Status: DC
Start: 2016-09-11 — End: 2017-03-10

## 2016-09-11 MED ORDER — METOPROLOL SUCCINATE ER 100 MG PO TB24: ORAL_TABLET | ORAL | 5 refills | Status: DC

## 2016-09-11 MED ORDER — METOPROLOL SUCCINATE ER 25 MG PO TB24: 12.5000 mg | ORAL_TABLET | Freq: Every day | ORAL | 5 refills | Status: DC

## 2016-09-11 NOTE — Telephone Encounter (Signed)
Patient calling to request refill of the follow medications:  metoprolol succinate (TOPROL-XL) 25 MG 24 hr tablet hydrochlorothiazide (HYDRODIURIL) 25 MG tablet  Preferred Pharmacies      [facility] [address] - [place], [place] - [phone] (Phone) [phone] (Fax)

## 2016-09-16 ENCOUNTER — Telehealth: Payer: Self-pay | Admitting: Family Medicine

## 2016-09-16 MED ORDER — METOPROLOL SUCCINATE ER 25 MG PO TB24: 25.0000 mg | ORAL_TABLET | Freq: Every day | ORAL | 11 refills | Status: DC

## 2016-09-16 NOTE — Telephone Encounter (Signed)
Patient has been taking a whole pill instead of just the half of metropol. She will run out tonight. Please send in Rx

## 2016-09-16 NOTE — Telephone Encounter (Signed)
Refill called in. 

## 2016-10-15 ENCOUNTER — Telehealth: Payer: Self-pay | Admitting: Family Medicine

## 2016-10-15 DIAGNOSIS — I10 Essential (primary) hypertension: Secondary | ICD-10-CM

## 2016-10-15 NOTE — Telephone Encounter (Signed)
Called pharmacy they will fill 100mg  metoprolol patient is to take 125mg  daily.

## 2016-10-15 NOTE — Telephone Encounter (Signed)
Patient will take the last Metoprolol ER 100MG  pill she has today. [facility] [place] Patient did mention she is also is still taking the 25MG  too. The pharmacy refilled the 25MG  but won't refill the 100MG .

## 2016-10-15 NOTE — Telephone Encounter (Signed)
Left message for patient

## 2017-03-10 ENCOUNTER — Other Ambulatory Visit: Payer: Self-pay | Admitting: Family Medicine

## 2017-03-10 DIAGNOSIS — I10 Essential (primary) hypertension: Secondary | ICD-10-CM

## 2017-04-10 ENCOUNTER — Other Ambulatory Visit: Payer: Self-pay | Admitting: Family Medicine

## 2017-04-10 DIAGNOSIS — I10 Essential (primary) hypertension: Secondary | ICD-10-CM

## 2017-04-11 ENCOUNTER — Encounter: Payer: Self-pay | Admitting: Family Medicine

## 2017-04-11 ENCOUNTER — Ambulatory Visit (INDEPENDENT_AMBULATORY_CARE_PROVIDER_SITE_OTHER): Payer: BC Managed Care – PPO | Admitting: Family Medicine

## 2017-04-11 VITALS — BP 132/81 | HR 78 | Temp 98.2°F | Resp 20 | Wt 220.0 lb

## 2017-04-11 DIAGNOSIS — Z716 Tobacco abuse counseling: Secondary | ICD-10-CM

## 2017-04-11 DIAGNOSIS — R635 Abnormal weight gain: Secondary | ICD-10-CM | POA: Diagnosis not present

## 2017-04-11 DIAGNOSIS — I10 Essential (primary) hypertension: Secondary | ICD-10-CM | POA: Diagnosis not present

## 2017-04-11 DIAGNOSIS — R0683 Snoring: Secondary | ICD-10-CM | POA: Insufficient documentation

## 2017-04-11 DIAGNOSIS — Z720 Tobacco use: Secondary | ICD-10-CM

## 2017-04-11 DIAGNOSIS — R7303 Prediabetes: Secondary | ICD-10-CM

## 2017-04-11 DIAGNOSIS — R002 Palpitations: Secondary | ICD-10-CM

## 2017-04-11 DIAGNOSIS — Z6841 Body Mass Index (BMI) 40.0 and over, adult: Secondary | ICD-10-CM | POA: Diagnosis not present

## 2017-04-11 DIAGNOSIS — R0681 Apnea, not elsewhere classified: Secondary | ICD-10-CM | POA: Insufficient documentation

## 2017-04-11 DIAGNOSIS — F17200 Nicotine dependence, unspecified, uncomplicated: Secondary | ICD-10-CM | POA: Diagnosis not present

## 2017-04-11 DIAGNOSIS — R400 Somnolence: Secondary | ICD-10-CM | POA: Insufficient documentation

## 2017-04-11 LAB — POCT GLYCOSYLATED HEMOGLOBIN (HGB A1C): Hemoglobin A1C: 5.6

## 2017-04-11 MED ORDER — METOPROLOL SUCCINATE ER 25 MG PO TB24: 25.0000 mg | ORAL_TABLET | Freq: Every day | ORAL | 11 refills | Status: DC

## 2017-04-11 NOTE — Progress Notes (Signed)
Subjective:    Patient ID: Teresa Calderon, female    DOB: March 18, 1966, 51 y.o.   MRN: [id number]  HPI  [id number] medical condition 6 month follow up   Hypertension/hyperlipidemia/palpitations:  Pt reports she smoked a cigarette so her Bp is higher than normal. She does not check her BP at home. She reports compliance with HCTZ 25 mg QD, metoprolol 125 mg total. She still endorses occasional palpitations. She denies chest pain, shortness of breath or LE edema.   Prior note:  Patient states that her blood pressure has been stable over the last few years. She currently is prescribed Toprol XL 100 mg daily and HCTZ 25 mg daily. Patient reports compliance with his medications. She denies any medication side effects. She denies any chest pain, shortness of breath, syncope or lower extremity edema. She does continuously states that she has palpitations. She feels that these have been worse over the last week. Palpitations are more profound when laying down. Her mother died in her sleep and She is concerned this is going to happen to her. Patient has had cardiology referral and workup, February 2016 with Dr. Antoine Poche, which demonstrated normal sinus rhythm, heart rate 73, axis within normal limits, intervals within normal limits, no ST changes on EKG. It was suspected patient was having PACs or PVCs. 2-D echocardiogram without contrast was completed on 02/22/2015 with an ejection fraction of 55-60% normal wall motion, normal echocardiogram. Patient continues smoking daily. Family history of hypertension, coronary artery disease and hyperlipidemia. She did have elevated cholesterol that has responded nicely to fish oil supplement. Patient does not take a daily aspirin.  She does not exercise routinely. She states she does watch the salt content in her diet.   Prediabetes/weight gain: Pt with history prediabetes with 22 lbs of weight gain since last visit. Denies polydipsia, polyuria.   Tobacco use  disorder/smoking cessation: pt reports she smokes menthol 100s. She does need to have a cigarette within the first 15 minutes of awakening. She smokes about 1/2 pack a day for over 20 years. She knows she needs to stop. She has stopped before but only for a few days. She is not ready to quit yet, but is considering it.   Snoring/daytime somnolence/witnessed apneic spells: Pt reports always being tired. Falling asleep when watching TV. Never feeling energetic. She snores, has palpitations, husband witnessed apnea spells, morbidly obese, hypertension.    Past Medical History:  Diagnosis Date  . Abnormal LFTs 02/23/2012  . Anxiety   . Cervical cancer screening 02/19/2012  . Hyperglycemia   . Hyperlipidemia   . Hypertension   . Pharyngitis 01/27/2012  . Tobacco abuse 01/26/2012   No Known Allergies Past Surgical History:  Procedure Laterality Date  . CESAREAN SECTION  1990   Family History  Problem Relation Age of Onset  . Hypertension Mother   . Hyperlipidemia Mother   . Coronary artery disease Mother 22    Died in her sleep.  Not a proven MI.  No history prior of this.  Marland Kitchen COPD Father     smoked  . Breast cancer Maternal Aunt    Social History   Social History  . Marital status: Married    Spouse name: N/A  . Number of children: 1  . Years of education: N/A   Occupational History  . Engineer, petroleum    Social History Main Topics  . Smoking status: Current Every Day Smoker    Packs/day: 0.50    Years: 20.00  Types: Cigarettes  . Smokeless tobacco: Never Used  . Alcohol use No  . Drug use: No  . Sexual activity: Yes    Partners: Male   Other Topics Concern  . Not on file   Social History Narrative   Lives with husband Loraine Leriche)  and son.   HS grad, [employer].    Smoke detector in the home, wears her seat belt.    No firearms.    Feels safe in relationship.     Review of Systems Negative, with the exception of above mentioned in HPI       Objective:   Physical Exam BP 132/81 (BP Location: Right Arm, Cuff Size: Large)   Pulse 78   Temp 98.2 F (36.8 C)   Resp 20   Wt 220 lb (99.8 kg)   SpO2 98%   BMI 40.24 kg/m   Body mass index is 40.24 kg/m. Gen: Afebrile. No acute distress. Obese.  HENT: AT. [place]. MMM.  Eyes:Pupils Equal Round Reactive to light, Extraocular movements intact,  Conjunctiva without redness, discharge or icterus. CV: RRR no murmur, no edema, +2/4 P posterior tibialis pulses Chest: CTAB, no wheeze or crackles Abd: Soft. NTND. BS present.  Skin: no rashes, purpura or petechiae.  Neuro:  Normal gait. PERLA. EOMi. Alert. Oriented.     Assessment & Plan:  Teresa Calderon is a 51 y.o. female present For follow-up on chronic medical conditions.  Essential hypertension, benign/palpitations - chronic, stable today.  - Continue 25 mg of HCTZ  Daily - continue metoprolol 125 mg daily  - Patient avoid caffeine - hydrochlorothiazide (HYDRODIURIL) 25 MG tablet; Take 1 tablet (25 mg total) by mouth daily.  Dispense: 30 tablet; Refill: 5 - metoprolol succinate (TOPROL-XL) 100 MG 24 hr tablet; Take 1 tablet (100 mg total) by mouth daily. Take with or immediately following a meal.  Dispense: 30 tablet; Refill: 5--> plus the 25 mg tab. HR normal.   Prediabetes/Weight gain/BMI 40.0-44.9, adult (HCC)/Morbid obesity (HCC) - A1c 6.0--> 5.6 --> 5.6  - New: gained 22 lbs. Diet and exercise.  - PREP program info provided for weight loss.  - should not need to repeat in 6 mos as long as no additional weight gain or symptoms.   Tobacco use disorder/smoking cessation: Patient was asked about smoking history. They were advised  to quit smoking in a clear, strong and personalized manner. Their willingness to quit smoking was assessed and felt to be in pre-contemplation phase. They were offered assistance in cessation and options were explained to them today.  Patient decided to wait a little longer, she knows she needs to quit.   Discussed any stimulant can cause increase in palpitations--> stop smoking.  Avoid caffeine.  3-10 minutes were spent today in counseling.  Pt advised to follow up if she decides she would like assistance in cessation.   Daytime somnolence/Snoring/Witnessed apneic spells - new - STOPBANG score --> high risk.  - diet and exercise.  - Ambulatory referral to Pulmonology for sleep apnea eval.   > 25 minutes spent with patient, >50% of time spent face to face counseling patient and coordinating care.   Electronically Signed by: Felix Pacini, DO [facility] [place]

## 2017-04-11 NOTE — Patient Instructions (Addendum)
I have refilled your medications. Follow up in 6 months.  Exercise > 150 minutes a week.  Low sodium.    I have referred you to pulmonology for sleep apnea evaluation.    Sleep Apnea Sleep apnea is a condition that affects breathing. People with sleep apnea have moments during sleep when their breathing pauses briefly or gets shallow. Sleep apnea can cause these symptoms:  Trouble staying asleep.  Sleepiness or tiredness during the day.  Irritability.  Loud snoring.  Morning headaches.  Trouble concentrating.  Forgetting things.  Less interest in sex.  Being sleepy for no reason.  Mood swings.  Personality changes.  Depression.  Waking up a lot during the night to pee (urinate).  Dry mouth.  Sore throat. Follow these instructions at home:  Make any changes in your routine that your doctor recommends.  Eat a healthy, well-balanced diet.  Take over-the-counter and prescription medicines only as told by your doctor.  Avoid using alcohol, calming medicines (sedatives), and narcotic medicines.  Take steps to lose weight if you are overweight.  If you were given a machine (device) to use while you sleep, use it only as told by your doctor.  Do not use any tobacco products, such as cigarettes, chewing tobacco, and e-cigarettes. If you need help quitting, ask your doctor.  Keep all follow-up visits as told by your doctor. This is important. Contact a doctor if:  The machine that you were given to use during sleep is uncomfortable or does not seem to be working.  Your symptoms do not get better.  Your symptoms get worse. Get help right away if:  Your chest hurts.  You have trouble breathing in enough air (shortness of breath).  You have an uncomfortable feeling in your back, arms, or stomach.  You have trouble talking.  One side of your body feels weak.  A part of your face is hanging down (drooping). These symptoms may be an emergency. Do not wait  to see if the symptoms will go away. Get medical help right away. Call your local emergency services (911 in the [place] Do not drive yourself to the hospital. This information is not intended to replace advice given to you by your health care provider. Make sure you discuss any questions you have with your health care provider. Document Released: 09/17/2008 Document Revised: 08/04/2016 Document Reviewed: 09/18/2015 Elsevier Interactive Patient Education  2017 [employer].   Smoking Tobacco Information Smoking tobacco will very likely harm your health. Tobacco contains a poisonous (toxic), colorless chemical called nicotine. Nicotine affects the brain and makes tobacco addictive. This change in your brain can make it hard to stop smoking. Tobacco also has other toxic chemicals that can hurt your body and raise your risk of many cancers. How can smoking tobacco affect me? Smoking tobacco can increase your chances of having serious health conditions, such as:  Cancer. Smoking is most commonly associated with lung cancer, but can lead to cancer in other parts of the body.  Chronic obstructive pulmonary disease (COPD). This is a long-term lung condition that makes it hard to breathe. It also gets worse over time.  High blood pressure (hypertension), heart disease, stroke, or heart attack.  Lung infections, such as pneumonia.  Cataracts. This is when the lenses in the eyes become clouded.  Digestive problems. This may include peptic ulcers, heartburn, and gastroesophageal reflux disease (GERD).  Oral health problems, such as gum disease and tooth loss.  Loss of taste and smell. Smoking  can affect your appearance by causing:  Wrinkles.  Yellow or stained teeth, fingers, and fingernails. Smoking tobacco can also affect your social life.  Many workplaces, [employer], hotels, and public places are tobacco-free. This means that you may experience challenges in finding places to smoke when  away from home.  The cost of a smoking habit can be expensive. Expenses for someone who smokes come in two ways:  You spend money on a regular basis to buy tobacco.  Your health care costs in the long-term are higher if you smoke.  Tobacco smoke can also affect the health of those around you. Children of smokers have greater chances of:  Sudden infant death syndrome (SIDS).  Ear infections.  Lung infections. What lifestyle changes can be made?  Do not start smoking. Quit if you already do.  To quit smoking:  Make a plan to quit smoking and commit yourself to it. Look for programs to help you and ask your health care provider for recommendations and ideas.  Talk with your health care provider about using nicotine replacement medicines to help you quit. Medicine replacement medicines include gum, lozenges, patches, sprays, or pills.  Do not replace cigarette smoking with electronic cigarettes, which are commonly called e-cigarettes. The safety of e-cigarettes is not known, and some may contain harmful chemicals.  Avoid places, people, or situations that tempt you to smoke.  If you try to quit but return to smoking, don't give up hope. It is very common for people to try a number of times before they fully succeed. When you feel ready again, give it another try.  Quitting smoking might affect the way you eat as well as your weight. Be prepared to monitor your eating habits. Get support in planning and following a healthy diet.  Ask your health care provider about having regular tests (screenings) to check for cancer. This may include blood tests, imaging tests, and other tests.  Exercise regularly. Consider taking walks, joining a gym, or doing yoga or exercise classes.  Develop skills to manage your stress. These skills include meditation. What are the benefits of quitting smoking? By quitting smoking, you may:  Lower your risk of getting cancer and other diseases caused by  smoking.  Live longer.  Breathe better.  Lower your blood pressure and heart rate.  Stop your addiction to tobacco.  Stop creating secondhand smoke that hurts other people.  Improve your sense of taste and smell.  Look better over time, due to having fewer wrinkles and less staining. What can happen if changes are not made? If you do not stop smoking, you may:  Get cancer and other diseases.  Develop COPD or other long-term (chronic) lung conditions.  Develop serious problems with your heart and blood vessels (cardiovascular system).  Need more tests to screen for problems caused by smoking.  Have higher, long-term healthcare costs from medicines or treatments related to smoking.  Continue to have worsening changes in your lungs, mouth, and nose. Where to find support: To get support to quit smoking, consider:  Asking your health care provider for more information and resources.  Taking classes to learn more about quitting smoking.  Looking for local organizations that offer resources about quitting smoking.  Joining a support group for people who want to quit smoking in your local community. Where to find more information: You may find more information about quitting smoking from:  HelpGuide.org: www.helpguide.org/articles/addictions/how-to-quit-smoking.htm  [employer]: smokefree.gov  American Lung Association: www.lung.org Contact a health care provider  if:  You have problems breathing.  Your lips, nose, or fingers turn blue.  You have chest pain.  You are coughing up blood.  You feel faint or you pass out.  You have other noticeable changes that cause you to worry. Summary  Smoking tobacco can negatively affect your health, the health of those around you, your finances, and your social life.  Do not start smoking. Quit if you already do. If you need help quitting, ask your health care provider.  Think about joining a support group for people who  want to quit smoking in your local community. There are many effective programs that will help you to quit this behavior. This information is not intended to replace advice given to you by your health care provider. Make sure you discuss any questions you have with your health care provider. Document Released: 12/24/2016 Document Revised: 12/24/2016 Document Reviewed: 12/24/2016 Elsevier Interactive Patient Education  2017 [employer].

## 2017-05-01 ENCOUNTER — Encounter: Payer: Self-pay | Admitting: Family Medicine

## 2017-05-15 ENCOUNTER — Encounter: Payer: Self-pay | Admitting: Pulmonary Disease

## 2017-07-31 ENCOUNTER — Institutional Professional Consult (permissible substitution): Payer: BC Managed Care – PPO | Admitting: Pulmonary Disease

## 2017-10-10 ENCOUNTER — Ambulatory Visit (INDEPENDENT_AMBULATORY_CARE_PROVIDER_SITE_OTHER): Payer: BC Managed Care – PPO | Admitting: Family Medicine

## 2017-10-10 ENCOUNTER — Encounter: Payer: Self-pay | Admitting: Family Medicine

## 2017-10-10 VITALS — BP 135/80 | HR 98 | Temp 97.8°F | Resp 20 | Ht 62.0 in | Wt 226.8 lb

## 2017-10-10 DIAGNOSIS — Z23 Encounter for immunization: Secondary | ICD-10-CM | POA: Diagnosis not present

## 2017-10-10 DIAGNOSIS — I10 Essential (primary) hypertension: Secondary | ICD-10-CM | POA: Diagnosis not present

## 2017-10-10 DIAGNOSIS — R002 Palpitations: Secondary | ICD-10-CM

## 2017-10-10 DIAGNOSIS — F419 Anxiety disorder, unspecified: Secondary | ICD-10-CM

## 2017-10-10 MED ORDER — HYDROCHLOROTHIAZIDE 25 MG PO TABS: 25.0000 mg | ORAL_TABLET | Freq: Every day | ORAL | 0 refills | Status: DC

## 2017-10-10 MED ORDER — METOPROLOL SUCCINATE ER 25 MG PO TB24: 25.0000 mg | ORAL_TABLET | Freq: Every day | ORAL | 5 refills | Status: DC

## 2017-10-10 MED ORDER — METOPROLOL SUCCINATE ER 100 MG PO TB24: ORAL_TABLET | ORAL | 5 refills | Status: DC

## 2017-10-10 NOTE — Patient Instructions (Signed)
Labs collected today, we will call you Monday with results.  If all normal, will increase your HCTZ to a total of 50 mg a day.   Low sodium diet. Low sugar and carbohydrates (pasta, rice, potato, bread).   Exercise > 150 minutes a week.    Follow up in 6 months.

## 2017-10-10 NOTE — Progress Notes (Signed)
Subjective:    Patient ID: Teresa Calderon, female    DOB: 1966/11/08, 51 y.o.   MRN: [id number]  Hypertension     Chief Complaint  Patient presents with  . Hypertension     Hypertension/hyperlipidemia/palpitations:  Pt reports compliance with Metoprolol 125 mg daily and HCTZ 25 mg daily. Blood pressures ranges at home not routinely checked. Patient denies chest pain, shortness of breath or lower extremity edema.  BMP: Completed today CBC: Completed today PSH: Completed today Lipids: Completed 05/23/2016 within normal limits with the exception of LDL of 119 RF: Hypertension, hyperlipidemia, Fhx coronary artery disease   Past Medical History:  Diagnosis Date  . Abnormal LFTs 02/23/2012  . Anxiety   . Cervical cancer screening 02/19/2012  . Hyperglycemia   . Hyperlipidemia   . Hypertension   . Pharyngitis 01/27/2012  . Tobacco abuse 01/26/2012   No Known Allergies Past Surgical History:  Procedure Laterality Date  . CESAREAN SECTION  1990   Family History  Problem Relation Age of Onset  . Hypertension Mother   . Hyperlipidemia Mother   . Coronary artery disease Mother 13       Died in her sleep.  Not a proven MI.  No history prior of this.  Marland Kitchen COPD Father        smoked  . Breast cancer Maternal Aunt    Social History   Social History  . Marital status: Married    Spouse name: N/A  . Number of children: 1  . Years of education: N/A   Occupational History  . Engineer, petroleum    Social History Main Topics  . Smoking status: Current Every Day Smoker    Packs/day: 0.50    Years: 20.00    Types: Cigarettes  . Smokeless tobacco: Never Used  . Alcohol use No  . Drug use: No  . Sexual activity: Yes    Partners: Male   Other Topics Concern  . Not on file   Social History Narrative   Lives with husband Loraine Leriche)  and son.   HS grad, [employer].    Smoke detector in the home, wears her seat belt.    No firearms.    Feels safe in relationship.      Review of Systems Negative, with the exception of above mentioned in HPI     Objective:   Physical Exam BP 135/80 (BP Location: Left Arm, Patient Position: Sitting, Cuff Size: Large)   Pulse 98   Temp 97.8 F (36.6 C)   Resp 20   Ht 5\' 2"  (1.575 m)   Wt 226 lb 12 oz (102.9 kg)   SpO2 97%   BMI 41.47 kg/m   Body mass index is 41.47 kg/m. Gen: Afebrile. No acute distress. Nontoxic in appearance, well-developed, well-nourished, Caucasian female. HENT: AT. [place].  MMM.  Eyes:Pupils Equal Round Reactive to light, Extraocular movements intact,  Conjunctiva without redness, discharge or icterus. Neck/lymp/endocrine: Supple, no lymphadenopathy, no thyromegaly CV: RRR no murmur, no edema, +2/4 P posterior tibialis pulses Chest: CTAB, no wheeze or crackles Abd: Soft. Obese. NTND. BS present.  Neuro:  Normal gait. PERLA. EOMi. Alert. Oriented x3      Assessment & Plan:  Teresa Calderon is a 51 y.o. female present For follow-up on chronic medical conditions.  Essential hypertension, benign/palpitations - Chronic, mildly elevated/borderline today. - Low-sodium diet and exercise encouraged. - Continue 25 mg of HCTZ  Daily, if labs are normal, will increase to HCTZ 50 mg daily. -  continue metoprolol 125 mg daily, refills provided today - Patient to avoid caffeine - Patient was referred to pulmonology for sleep apnea eval - CBC, CMP, TSH - Follow-up 6 months  Influenza vaccination provided today.    Electronically Signed by: Felix Pacinienee Anedra Penafiel, DO [facility]- [place]

## 2017-10-11 LAB — CBC
HCT: 42.3 % (ref 35.0–45.0)
HEMOGLOBIN: 15 g/dL (ref 11.7–15.5)
MCH: 30.9 pg (ref 27.0–33.0)
MCHC: 35.5 g/dL (ref 32.0–36.0)
MCV: 87 fL (ref 80.0–100.0)
MPV: 11.3 fL (ref 7.5–12.5)
Platelets: 274 10*3/uL (ref 140–400)
RBC: 4.86 10*6/uL (ref 3.80–5.10)
RDW: 12.3 % (ref 11.0–15.0)
WBC: 11 10*3/uL — ABNORMAL HIGH (ref 3.8–10.8)

## 2017-10-11 LAB — BASIC METABOLIC PANEL
BUN: 18 mg/dL (ref 7–25)
CO2: 27 mmol/L (ref 20–32)
Calcium: 9.4 mg/dL (ref 8.6–10.4)
Chloride: 102 mmol/L (ref 98–110)
Creat: 0.65 mg/dL (ref 0.50–1.05)
GLUCOSE: 100 mg/dL — AB (ref 65–99)
Potassium: 3.8 mmol/L (ref 3.5–5.3)
SODIUM: 139 mmol/L (ref 135–146)

## 2017-10-11 LAB — TSH: TSH: 1.13 mIU/L

## 2017-10-13 ENCOUNTER — Encounter: Payer: Self-pay | Admitting: Family Medicine

## 2017-10-13 ENCOUNTER — Telehealth: Payer: Self-pay | Admitting: Family Medicine

## 2017-10-13 DIAGNOSIS — I10 Essential (primary) hypertension: Secondary | ICD-10-CM

## 2017-10-13 MED ORDER — HYDROCHLOROTHIAZIDE 50 MG PO TABS: 50.0000 mg | ORAL_TABLET | Freq: Every day | ORAL | 1 refills | Status: DC

## 2017-10-13 NOTE — Telephone Encounter (Signed)
Patient notified and verbalized understanding. Patient is agreeable to recommendations.

## 2017-10-13 NOTE — Telephone Encounter (Signed)
Please call pt:  - her labs are stable.  - Increase her HCTZ to 50 mg total a day. She can finsih what she has by taking 2- 25 mg tabs. New script will be a 50 mg tab (so take 1). - f/u BP in 6 months

## 2018-04-08 ENCOUNTER — Ambulatory Visit: Payer: BC Managed Care – PPO | Admitting: Family Medicine

## 2018-04-08 ENCOUNTER — Encounter: Payer: Self-pay | Admitting: Family Medicine

## 2018-04-08 VITALS — BP 130/82 | HR 90 | Temp 98.0°F | Resp 20 | Ht 62.0 in | Wt 230.0 lb

## 2018-04-08 DIAGNOSIS — R002 Palpitations: Secondary | ICD-10-CM

## 2018-04-08 DIAGNOSIS — I10 Essential (primary) hypertension: Secondary | ICD-10-CM | POA: Diagnosis not present

## 2018-04-08 MED ORDER — HYDROCHLOROTHIAZIDE 50 MG PO TABS: 50.0000 mg | ORAL_TABLET | Freq: Every day | ORAL | 1 refills | Status: DC

## 2018-04-08 MED ORDER — METOPROLOL SUCCINATE ER 100 MG PO TB24: ORAL_TABLET | ORAL | 5 refills | Status: DC

## 2018-04-08 MED ORDER — METOPROLOL SUCCINATE ER 25 MG PO TB24: 25.0000 mg | ORAL_TABLET | Freq: Every day | ORAL | 5 refills | Status: DC

## 2018-04-08 MED ORDER — FLUTICASONE PROPIONATE 50 MCG/ACT NA SUSP: 2.0000 | Freq: Every day | NASAL | 6 refills | Status: DC

## 2018-04-08 NOTE — Progress Notes (Signed)
Teresa Calderon , 1966-05-03, 52 y.o., female MRN: [id number] Patient Care Team    Relationship Specialty Notifications Start End  Natalia Leatherwood, DO PCP - General Family Medicine  01/17/16     Chief Complaint  Patient presents with  . Hypertension     Subjective:  Hypertension/hyperlipidemia/palpitations/morbid obesity:  Pt reports compliance with Metoprolol 125 mg daily and HCTZ 50 mg daily. Blood pressures ranges at home not routinely checked. Patient denies chest pain, shortness of breath, dizziness or lower extremity edema.  BMP: 10/10/2017 WNL CBC: 10/10/2017 WNL TSH: 10/10/2017 WNL Lipids: Completed 05/23/2016 within normal limits with the exception of LDL of 119 RF: Hypertension, hyperlipidemia, Fhx coronary artery disease  Depression screen [facility] 2/9 04/08/2018 10/10/2017 02/21/2016  Decreased Interest 0 0 0  Down, Depressed, Hopeless 0 0 0  PHQ - 2 Score 0 0 0    No Known Allergies Social History   Tobacco Use  . Smoking status: Current Every Day Smoker    Packs/day: 0.50    Years: 20.00    Pack years: 10.00    Types: Cigarettes  . Smokeless tobacco: Never Used  Substance Use Topics  . Alcohol use: No    Alcohol/week: 0.0 oz   Past Medical History:  Diagnosis Date  . Abnormal LFTs 02/23/2012  . Anxiety   . Cervical cancer screening 02/19/2012  . Hyperglycemia   . Hyperlipidemia   . Hypertension   . Tobacco abuse 01/26/2012   Past Surgical History:  Procedure Laterality Date  . CESAREAN SECTION  1990   Family History  Problem Relation Age of Onset  . Hypertension Mother   . Hyperlipidemia Mother   . Coronary artery disease Mother 64       Died in her sleep.  Not a proven MI.  No history prior of this.  Marland Kitchen COPD Father        smoked  . Breast cancer Maternal Aunt    Allergies as of 04/08/2018   No Known Allergies     Medication List        Accurate as of 04/08/18  2:56 PM. Always use your most recent med list.          cholecalciferol 1000 units  tablet Commonly known as:  VITAMIN D Take 1 tablet (1,000 Units total) by mouth daily.   Fish Oil 1000 MG Caps Take 1 capsule by mouth daily.   hydrochlorothiazide 50 MG tablet Commonly known as:  HYDRODIURIL Take 1 tablet (50 mg total) by mouth daily.   metoprolol succinate 100 MG 24 hr tablet Commonly known as:  TOPROL-XL TAKE ONE TABLET BY MOUTH ONCE DAILY, TAKE WITH OR IMMEDIATELY FOLLOWING A MEAL   metoprolol succinate 25 MG 24 hr tablet Commonly known as:  TOPROL-XL Take 1 tablet (25 mg total) by mouth daily.   multivitamin tablet Take 1 tablet by mouth daily.       All past medical history, surgical history, allergies, family history, immunizations andmedications were updated in the EMR today and reviewed under the history and medication portions of their EMR.     ROS: Negative, with the exception of above mentioned in HPI   Objective:  BP 130/82 (BP Location: Right Arm, Patient Position: Sitting, Cuff Size: Large)   Pulse 90   Temp 98 F (36.7 C)   Resp 20   Ht 5\' 2"  (1.575 m)   Wt 230 lb (104.3 kg)   SpO2 97%   BMI 42.07 kg/m  Body mass index is  42.07 kg/m. Gen: Afebrile. No acute distress. Nontoxic in appearance, well developed, well nourished.  HENT: AT. [place]. MMM, no oral lesions. Eyes:Pupils Equal Round Reactive to light, Extraocular movements intact,  Conjunctiva without redness, discharge or icterus. CV: RRR no murmur, no edema Chest: CTAB, no wheeze or crackles. Good air movement, normal resp effort.  Abd: Soft. NTND. BS present. no Masses palpated. No rebound or guarding. Neuro: Normal gait. PERLA. EOMi. Alert. Oriented x3  Psych: Normal affect, dress and demeanor. Normal speech. Normal thought content and judgment.  No exam data present No results found. No results found for this or any previous visit (from the past 24 hour(s)).  Assessment/Plan: Teresa FrameJanice Calderon is a 52 y.o. female present for OV for  Essential hypertension,  benign/palpitaitons/morbid obesity: - continue current medication doses, refills provided today.  - low sodium diet. > exercise 150 min/wk, stop smoking. - hydrochlorothiazide (HYDRODIURIL) 50 MG tablet; Take 1 tablet (50 mg total) by mouth daily.  Dispense: 90 tablet; Refill: 1 - metoprolol succinate (TOPROL-XL) 25 MG 24 hr tablet; Take 1 tablet (25 mg total) by mouth daily.  Dispense: 30 tablet; Refill: 5 - metoprolol succinate (TOPROL-XL) 100 MG 24 hr tablet; TAKE ONE TABLET BY MOUTH ONCE DAILY, TAKE WITH OR IMMEDIATELY FOLLOWING A MEAL  Dispense: 30 tablet; Refill: 5 - f/u 6 months w/ CPE (fasting labs)   Reviewed expectations re: course of current medical issues.  Discussed self-management of symptoms.  Outlined signs and symptoms indicating need for more acute intervention.  Patient verbalized understanding and all questions were answered.  Patient received an After-Visit Summary.    No orders of the defined types were placed in this encounter.    Note is dictated utilizing voice recognition software. Although note has been proof read prior to signing, occasional typographical errors still can be missed. If any questions arise, please do not hesitate to call for verification.   electronically signed by:  Felix Pacinienee Kuneff, DO  [facility] [place]

## 2018-04-08 NOTE — Patient Instructions (Addendum)
I have refilled your medications today. Please schedule your physical in 6  Months.   Your blood pressure looks good. Have a happy Easter.

## 2018-05-07 ENCOUNTER — Telehealth: Payer: Self-pay | Admitting: Family Medicine

## 2018-05-07 NOTE — Telephone Encounter (Signed)
Copied from CRM [id number]. Topic: Quick Communication - See Telephone Encounter >> May 07, 2018  3:51 PM Windy Kalata, NT wrote: CRM for notification. See Telephone encounter for: 05/07/18.  Patient is needing a refill on hydrochlorothiazide (HYDRODIURIL) 50 MG tablet. She states on the bottle it says no refills. She states it was not a script put in on 04/08/18.   [facility] [address], [place] - [address] [place] [address] Phone: [phone] Fax: [phone]

## 2018-05-07 NOTE — Telephone Encounter (Signed)
Contacted [facility] in [place] Rx is ready. Left message that Rx is ready for pickup.

## 2018-09-30 ENCOUNTER — Other Ambulatory Visit: Payer: Self-pay | Admitting: Family Medicine

## 2018-09-30 DIAGNOSIS — I10 Essential (primary) hypertension: Secondary | ICD-10-CM

## 2018-09-30 DIAGNOSIS — R002 Palpitations: Secondary | ICD-10-CM

## 2018-09-30 MED ORDER — METOPROLOL SUCCINATE ER 25 MG PO TB24
25.0000 mg | ORAL_TABLET | Freq: Every day | ORAL | 5 refills | Status: DC
Start: 2018-09-30 — End: 2018-10-26

## 2018-09-30 MED ORDER — HYDROCHLOROTHIAZIDE 50 MG PO TABS: 50.0000 mg | ORAL_TABLET | Freq: Every day | ORAL | 1 refills | Status: DC

## 2018-09-30 NOTE — Telephone Encounter (Signed)
Copied from CRM [id number]. Topic: Quick Communication - Rx Refill/Question >> Sep 30, 2018  3:34 PM Angela Nevin wrote: Medication: metoprolol succinate (TOPROL-XL) 100 MG 24 hr tablet AND hydrochlorothiazide (HYDRODIURIL) 50 MG tablet [[id number]]   Pt is requesting a refill of these medications, stating she will be out before upcoming appointment on 10/18.   Preferred Pharmacy (with phone number or street name): [facility] [address], [place] - [address] [phone] (Phone) [phone] (Fax)

## 2018-10-03 ENCOUNTER — Other Ambulatory Visit: Payer: Self-pay | Admitting: Family Medicine

## 2018-10-03 DIAGNOSIS — I10 Essential (primary) hypertension: Secondary | ICD-10-CM

## 2018-10-03 DIAGNOSIS — R002 Palpitations: Secondary | ICD-10-CM

## 2018-10-09 ENCOUNTER — Encounter: Payer: BC Managed Care – PPO | Admitting: Family Medicine

## 2018-10-26 ENCOUNTER — Ambulatory Visit (INDEPENDENT_AMBULATORY_CARE_PROVIDER_SITE_OTHER): Payer: BC Managed Care – PPO | Admitting: Family Medicine

## 2018-10-26 ENCOUNTER — Encounter: Payer: Self-pay | Admitting: Family Medicine

## 2018-10-26 VITALS — BP 138/76 | HR 96 | Temp 98.2°F | Resp 20 | Ht 62.0 in | Wt 199.0 lb

## 2018-10-26 DIAGNOSIS — Z23 Encounter for immunization: Secondary | ICD-10-CM

## 2018-10-26 DIAGNOSIS — R002 Palpitations: Secondary | ICD-10-CM

## 2018-10-26 DIAGNOSIS — E781 Pure hyperglyceridemia: Secondary | ICD-10-CM

## 2018-10-26 DIAGNOSIS — R3589 Other polyuria: Secondary | ICD-10-CM

## 2018-10-26 DIAGNOSIS — Z720 Tobacco use: Secondary | ICD-10-CM

## 2018-10-26 DIAGNOSIS — Z124 Encounter for screening for malignant neoplasm of cervix: Secondary | ICD-10-CM

## 2018-10-26 DIAGNOSIS — Z79899 Other long term (current) drug therapy: Secondary | ICD-10-CM

## 2018-10-26 DIAGNOSIS — Z1239 Encounter for other screening for malignant neoplasm of breast: Secondary | ICD-10-CM

## 2018-10-26 DIAGNOSIS — Z0000 Encounter for general adult medical examination without abnormal findings: Secondary | ICD-10-CM

## 2018-10-26 DIAGNOSIS — R300 Dysuria: Secondary | ICD-10-CM

## 2018-10-26 DIAGNOSIS — Z1211 Encounter for screening for malignant neoplasm of colon: Secondary | ICD-10-CM

## 2018-10-26 DIAGNOSIS — N898 Other specified noninflammatory disorders of vagina: Secondary | ICD-10-CM

## 2018-10-26 DIAGNOSIS — R358 Other polyuria: Secondary | ICD-10-CM

## 2018-10-26 DIAGNOSIS — I10 Essential (primary) hypertension: Secondary | ICD-10-CM | POA: Diagnosis not present

## 2018-10-26 DIAGNOSIS — R631 Polydipsia: Secondary | ICD-10-CM

## 2018-10-26 DIAGNOSIS — E559 Vitamin D deficiency, unspecified: Secondary | ICD-10-CM

## 2018-10-26 MED ORDER — MICONAZOLE NITRATE 2 % EX CREA: 1.0000 "application " | TOPICAL_CREAM | Freq: Two times a day (BID) | CUTANEOUS | 0 refills | Status: DC

## 2018-10-26 MED ORDER — HYDROCHLOROTHIAZIDE 50 MG PO TABS: 50.0000 mg | ORAL_TABLET | Freq: Every day | ORAL | 1 refills | Status: DC

## 2018-10-26 MED ORDER — FLUCONAZOLE 150 MG PO TABS: 150.0000 mg | ORAL_TABLET | Freq: Once | ORAL | 0 refills | Status: AC

## 2018-10-26 MED ORDER — METOPROLOL SUCCINATE ER 100 MG PO TB24: ORAL_TABLET | ORAL | 1 refills | Status: DC

## 2018-10-26 MED ORDER — METOPROLOL SUCCINATE ER 25 MG PO TB24: 25.0000 mg | ORAL_TABLET | Freq: Every day | ORAL | 1 refills | Status: DC

## 2018-10-26 NOTE — Patient Instructions (Addendum)
I have refilled your medications.  We will call you when all the labs result. PAP can take about a week.  I called in diflucan to take 1 pill orally  today and then repeat dose in 3 days. Continue monistat or yeast infection cream over irritated areas on the outside.   Health Maintenance, Female Adopting a healthy lifestyle and getting preventive care can go a long way to promote health and wellness. Talk with your health care provider about what schedule of regular examinations is right for you. This is a good chance for you to check in with your provider about disease prevention and staying healthy. In between checkups, there are plenty of things you can do on your own. Experts have done a lot of research about which lifestyle changes and preventive measures are most likely to keep you healthy. Ask your health care provider for more information. Weight and diet Eat a healthy diet  Be sure to include plenty of vegetables, fruits, low-fat dairy products, and lean protein.  Do not eat a lot of foods high in solid fats, added sugars, or salt.  Get regular exercise. This is one of the most important things you can do for your health. ? Most adults should exercise for at least 150 minutes each week. The exercise should increase your heart rate and make you sweat (moderate-intensity exercise). ? Most adults should also do strengthening exercises at least twice a week. This is in addition to the moderate-intensity exercise.  Maintain a healthy weight  Body mass index (BMI) is a measurement that can be used to identify possible weight problems. It estimates body fat based on height and weight. Your health care provider can help determine your BMI and help you achieve or maintain a healthy weight.  For females 25 years of age and older: ? A BMI below 18.5 is considered underweight. ? A BMI of 18.5 to 24.9 is normal. ? A BMI of 25 to 29.9 is considered overweight. ? A BMI of 30 and above is  considered obese.  Watch levels of cholesterol and blood lipids  You should start having your blood tested for lipids and cholesterol at 53 years of age, then have this test every 5 years.  You may need to have your cholesterol levels checked more often if: ? Your lipid or cholesterol levels are high. ? You are older than 53 years of age. ? You are at high risk for heart disease.  Cancer screening Lung Cancer  Lung cancer screening is recommended for adults 27-13 years old who are at high risk for lung cancer because of a history of smoking.  A yearly low-dose CT scan of the lungs is recommended for people who: ? Currently smoke. ? Have quit within the past 15 years. ? Have at least a 30-pack-year history of smoking. A pack year is smoking an average of one pack of cigarettes a day for 1 year.  Yearly screening should continue until it has been 15 years since you quit.  Yearly screening should stop if you develop a health problem that would prevent you from having lung cancer treatment.  Breast Cancer  Practice breast self-awareness. This means understanding how your breasts normally appear and feel.  It also means doing regular breast self-exams. Let your health care provider know about any changes, no matter how small.  If you are in your 20s or 30s, you should have a clinical breast exam (CBE) by a health care provider every 1-3 years  as part of a regular health exam.  If you are 54 or older, have a CBE every year. Also consider having a breast X-ray (mammogram) every year.  If you have a family history of breast cancer, talk to your health care provider about genetic screening.  If you are at high risk for breast cancer, talk to your health care provider about having an MRI and a mammogram every year.  Breast cancer gene (BRCA) assessment is recommended for women who have family members with BRCA-related cancers. BRCA-related cancers  include: ? Breast. ? Ovarian. ? Tubal. ? Peritoneal cancers.  Results of the assessment will determine the need for genetic counseling and BRCA1 and BRCA2 testing.  Cervical Cancer Your health care provider may recommend that you be screened regularly for cancer of the pelvic organs (ovaries, uterus, and vagina). This screening involves a pelvic examination, including checking for microscopic changes to the surface of your cervix (Pap test). You may be encouraged to have this screening done every 3 years, beginning at age 69.  For women ages 40-65, health care providers may recommend pelvic exams and Pap testing every 3 years, or they may recommend the Pap and pelvic exam, combined with testing for human papilloma virus (HPV), every 5 years. Some types of HPV increase your risk of cervical cancer. Testing for HPV may also be done on women of any age with unclear Pap test results.  Other health care providers may not recommend any screening for nonpregnant women who are considered low risk for pelvic cancer and who do not have symptoms. Ask your health care provider if a screening pelvic exam is right for you.  If you have had past treatment for cervical cancer or a condition that could lead to cancer, you need Pap tests and screening for cancer for at least 20 years after your treatment. If Pap tests have been discontinued, your risk factors (such as having a new sexual partner) need to be reassessed to determine if screening should resume. Some women have medical problems that increase the chance of getting cervical cancer. In these cases, your health care provider may recommend more frequent screening and Pap tests.  Colorectal Cancer  This type of cancer can be detected and often prevented.  Routine colorectal cancer screening usually begins at 53 years of age and continues through 53 years of age.  Your health care provider may recommend screening at an earlier age if you have risk factors  for colon cancer.  Your health care provider may also recommend using home test kits to check for hidden blood in the stool.  A small camera at the end of a tube can be used to examine your colon directly (sigmoidoscopy or colonoscopy). This is done to check for the earliest forms of colorectal cancer.  Routine screening usually begins at age 60.  Direct examination of the colon should be repeated every 5-10 years through 53 years of age. However, you may need to be screened more often if early forms of precancerous polyps or small growths are found.  Skin Cancer  Check your skin from head to toe regularly.  Tell your health care provider about any new moles or changes in moles, especially if there is a change in a mole's shape or color.  Also tell your health care provider if you have a mole that is larger than the size of a pencil eraser.  Always use sunscreen. Apply sunscreen liberally and repeatedly throughout the day.  Protect yourself by  wearing long sleeves, pants, a wide-brimmed hat, and sunglasses whenever you are outside.  Heart disease, diabetes, and high blood pressure  High blood pressure causes heart disease and increases the risk of stroke. High blood pressure is more likely to develop in: ? People who have blood pressure in the high end of the normal range (130-139/85-89 mm Hg). ? People who are overweight or obese. ? People who are African American.  If you are 48-51 years of age, have your blood pressure checked every 3-5 years. If you are 17 years of age or older, have your blood pressure checked every year. You should have your blood pressure measured twice-once when you are at a hospital or clinic, and once when you are not at a hospital or clinic. Record the average of the two measurements. To check your blood pressure when you are not at a hospital or clinic, you can use: ? An automated blood pressure machine at a pharmacy. ? A home blood pressure monitor.  If  you are between 67 years and 58 years old, ask your health care provider if you should take aspirin to prevent strokes.  Have regular diabetes screenings. This involves taking a blood sample to check your fasting blood sugar level. ? If you are at a normal weight and have a low risk for diabetes, have this test once every three years after 53 years of age. ? If you are overweight and have a high risk for diabetes, consider being tested at a younger age or more often. Preventing infection Hepatitis B  If you have a higher risk for hepatitis B, you should be screened for this virus. You are considered at high risk for hepatitis B if: ? You were born in a country where hepatitis B is common. Ask your health care provider which countries are considered high risk. ? Your parents were born in a high-risk country, and you have not been immunized against hepatitis B (hepatitis B vaccine). ? You have HIV or AIDS. ? You use needles to inject street drugs. ? You live with someone who has hepatitis B. ? You have had sex with someone who has hepatitis B. ? You get hemodialysis treatment. ? You take certain medicines for conditions, including cancer, organ transplantation, and autoimmune conditions.  Hepatitis C  Blood testing is recommended for: ? Everyone born from [birth year] through 1966. ? Anyone with known risk factors for hepatitis C.  Sexually transmitted infections (STIs)  You should be screened for sexually transmitted infections (STIs) including gonorrhea and chlamydia if: ? You are sexually active and are younger than 53 years of age. ? You are older than 53 years of age and your health care provider tells you that you are at risk for this type of infection. ? Your sexual activity has changed since you were last screened and you are at an increased risk for chlamydia or gonorrhea. Ask your health care provider if you are at risk.  If you do not have HIV, but are at risk, it may be recommended  that you take a prescription medicine daily to prevent HIV infection. This is called pre-exposure prophylaxis (PrEP). You are considered at risk if: ? You are sexually active and do not regularly use condoms or know the HIV status of your partner(s). ? You take drugs by injection. ? You are sexually active with a partner who has HIV.  Talk with your health care provider about whether you are at high risk of being infected with  HIV. If you choose to begin PrEP, you should first be tested for HIV. You should then be tested every 3 months for as long as you are taking PrEP. Pregnancy  If you are premenopausal and you may become pregnant, ask your health care provider about preconception counseling.  If you may become pregnant, take 400 to 800 micrograms (mcg) of folic acid every day.  If you want to prevent pregnancy, talk to your health care provider about birth control (contraception). Osteoporosis and menopause  Osteoporosis is a disease in which the bones lose minerals and strength with aging. This can result in serious bone fractures. Your risk for osteoporosis can be identified using a bone density scan.  If you are 20 years of age or older, or if you are at risk for osteoporosis and fractures, ask your health care provider if you should be screened.  Ask your health care provider whether you should take a calcium or vitamin D supplement to lower your risk for osteoporosis.  Menopause may have certain physical symptoms and risks.  Hormone replacement therapy may reduce some of these symptoms and risks. Talk to your health care provider about whether hormone replacement therapy is right for you. Follow these instructions at home:  Schedule regular health, dental, and eye exams.  Stay current with your immunizations.  Do not use any tobacco products including cigarettes, chewing tobacco, or electronic cigarettes.  If you are pregnant, do not drink alcohol.  If you are  breastfeeding, limit how much and how often you drink alcohol.  Limit alcohol intake to no more than 1 drink per day for nonpregnant women. One drink equals 12 ounces of beer, 5 ounces of wine, or 1 ounces of hard liquor.  Do not use street drugs.  Do not share needles.  Ask your health care provider for help if you need support or information about quitting drugs.  Tell your health care provider if you often feel depressed.  Tell your health care provider if you have ever been abused or do not feel safe at home. This information is not intended to replace advice given to you by your health care provider. Make sure you discuss any questions you have with your health care provider. Document Released: 06/24/2011 Document Revised: 05/16/2016 Document Reviewed: 09/12/2015 Elsevier Interactive Patient Education  2018 [employer].   Smoking Tobacco Information Smoking tobacco will very likely harm your health. Tobacco contains a poisonous (toxic), colorless chemical called nicotine. Nicotine affects the brain and makes tobacco addictive. This change in your brain can make it hard to stop smoking. Tobacco also has other toxic chemicals that can hurt your body and raise your risk of many cancers. How can smoking tobacco affect me? Smoking tobacco can increase your chances of having serious health conditions, such as:  Cancer. Smoking is most commonly associated with lung cancer, but can lead to cancer in other parts of the body.  Chronic obstructive pulmonary disease (COPD). This is a long-term lung condition that makes it hard to breathe. It also gets worse over time.  High blood pressure (hypertension), heart disease, stroke, or heart attack.  Lung infections, such as pneumonia.  Cataracts. This is when the lenses in the eyes become clouded.  Digestive problems. This may include peptic ulcers, heartburn, and gastroesophageal reflux disease (GERD).  Oral health problems, such as gum  disease and tooth loss.  Loss of taste and smell.  Smoking can affect your appearance by causing:  Wrinkles.  Yellow or stained  teeth, fingers, and fingernails.  Smoking tobacco can also affect your social life.  Many workplaces, [employer], hotels, and public places are tobacco-free. This means that you may experience challenges in finding places to smoke when away from home.  The cost of a smoking habit can be expensive. Expenses for someone who smokes come in two ways: ? You spend money on a regular basis to buy tobacco. ? Your health care costs in the long-term are higher if you smoke.  Tobacco smoke can also affect the health of those around you. Children of smokers have greater chances of: ? Sudden infant death syndrome (SIDS). ? Ear infections. ? Lung infections.  What lifestyle changes can be made?  Do not start smoking. Quit if you already do.  To quit smoking: ? Make a plan to quit smoking and commit yourself to it. Look for programs to help you and ask your health care provider for recommendations and ideas. ? Talk with your health care provider about using nicotine replacement medicines to help you quit. Medicine replacement medicines include gum, lozenges, patches, sprays, or pills. ? Do not replace cigarette smoking with electronic cigarettes, which are commonly called e-cigarettes. The safety of e-cigarettes is not known, and some may contain harmful chemicals. ? Avoid places, people, or situations that tempt you to smoke. ? If you try to quit but return to smoking, don't give up hope. It is very common for people to try a number of times before they fully succeed. When you feel ready again, give it another try.  Quitting smoking might affect the way you eat as well as your weight. Be prepared to monitor your eating habits. Get support in planning and following a healthy diet.  Ask your health care provider about having regular tests (screenings) to check for  cancer. This may include blood tests, imaging tests, and other tests.  Exercise regularly. Consider taking walks, joining a gym, or doing yoga or exercise classes.  Develop skills to manage your stress. These skills include meditation. What are the benefits of quitting smoking? By quitting smoking, you may:  Lower your risk of getting cancer and other diseases caused by smoking.  Live longer.  Breathe better.  Lower your blood pressure and heart rate.  Stop your addiction to tobacco.  Stop creating secondhand smoke that hurts other people.  Improve your sense of taste and smell.  Look better over time, due to having fewer wrinkles and less staining.  What can happen if changes are not made? If you do not stop smoking, you may:  Get cancer and other diseases.  Develop COPD or other long-term (chronic) lung conditions.  Develop serious problems with your heart and blood vessels (cardiovascular system).  Need more tests to screen for problems caused by smoking.  Have higher, long-term healthcare costs from medicines or treatments related to smoking.  Continue to have worsening changes in your lungs, mouth, and nose.  Where to find support: To get support to quit smoking, consider:  Asking your health care provider for more information and resources.  Taking classes to learn more about quitting smoking.  Looking for local organizations that offer resources about quitting smoking.  Joining a support group for people who want to quit smoking in your local community.  Where to find more information: You may find more information about quitting smoking from:  HelpGuide.org: www.helpguide.org/articles/addictions/how-to-quit-smoking.htm  https://hall.com/: smokefree.gov  American Lung Association: www.lung.org  Contact a health care provider if:  You have problems breathing.  Your lips, nose, or fingers turn blue.  You have chest pain.  You are coughing up  blood.  You feel faint or you pass out.  You have other noticeable changes that cause you to worry. Summary  Smoking tobacco can negatively affect your health, the health of those around you, your finances, and your social life.  Do not start smoking. Quit if you already do. If you need help quitting, ask your health care provider.  Think about joining a support group for people who want to quit smoking in your local community. There are many effective programs that will help you to quit this behavior. This information is not intended to replace advice given to you by your health care provider. Make sure you discuss any questions you have with your health care provider. Document Released: 12/24/2016 Document Revised: 12/24/2016 Document Reviewed: 12/24/2016 Elsevier Interactive Patient Education  [employer].

## 2018-10-26 NOTE — Progress Notes (Signed)
Patient ID: Teresa Calderon, female  DOB: September 21, 1966, 52 y.o.   MRN: [id number] Patient Care Team    Relationship Specialty Notifications Start End  Ma Hillock, DO PCP - General Family Medicine  01/17/16     Chief Complaint  Patient presents with  . Annual Exam    Subjective:  Teresa Calderon is a 52 y.o.  Female  present for CPE. All past medical history, surgical history, allergies, family history, immunizations, medications and social history were updated in the electronic medical record today. All recent labs, ED visits and hospitalizations within the last year were reviewed.  Patient complains of polydipsia, polyuria and vaginal irritation all present > 2 months. She denies exposure to STD or change in sexual partner. She has tried many otc creams to help with symptoms, none worked.   Health maintenance:  Colonoscopy: Never, ordered placed today. Mammogram: completed: overdue. Ordered today. SBE encouraged Cervical cancer screening: last pap: 01/31/2015, results: WNL/Neg HPV rpt 3-5 years. Pt would like completed today.  Immunizations: tdap 01/31/2015, Influenza provided today(encouraged yearly),  zostavax Shingrix #1 today. Nurse visit in 3 mos for #2. Infectious disease screening: HIV declined DEXA:N/A Assistive device: none Oxygen WHQ:PRFF Patient has a Dental home. Hospitalizations/ED visits: reviewed  Depression screen [facility] 2/9 10/26/2018 04/08/2018 10/10/2017 02/21/2016  Decreased Interest 0 0 0 0  Down, Depressed, Hopeless 0 0 0 0  PHQ - 2 Score 0 0 0 0   No flowsheet data found.   Current Exercise Habits: The patient does not participate in regular exercise at present Exercise limited by: None identified   Immunization History  Administered Date(s) Administered  . Influenza,inj,Quad PF,6+ Mos 10/10/2017, 10/26/2018  . Td 09/23/2005  . Tdap 01/31/2015  . Zoster Recombinat (Shingrix) 10/26/2018    Past Medical History:  Diagnosis Date  . Abnormal LFTs  02/23/2012  . Anxiety   . Cervical cancer screening 02/19/2012  . Hyperglycemia   . Hyperlipidemia   . Hypertension   . Tobacco abuse 01/26/2012   No Known Allergies Past Surgical History:  Procedure Laterality Date  . CESAREAN SECTION  1990   Family History  Problem Relation Age of Onset  . Hypertension Mother   . Hyperlipidemia Mother   . Coronary artery disease Mother 11       Died in her sleep.  Not a proven MI.  No history prior of this.  Marland Kitchen COPD Father        smoked  . Breast cancer Maternal Aunt    Social History   Socioeconomic History  . Marital status: Married    Spouse name: Not on file  . Number of children: 1  . Years of education: Not on file  . Highest education level: Not on file  Occupational History  . Occupation: Systems analyst  Social Needs  . Financial resource strain: Not on file  . Food insecurity:    Worry: Not on file    Inability: Not on file  . Transportation needs:    Medical: Not on file    Non-medical: Not on file  Tobacco Use  . Smoking status: Current Every Day Smoker    Packs/day: 0.50    Years: 20.00    Pack years: 10.00    Types: Cigarettes  . Smokeless tobacco: Never Used  Substance and Sexual Activity  . Alcohol use: No    Alcohol/week: 0.0 standard drinks  . Drug use: No  . Sexual activity: Yes    Partners: Male  Lifestyle  .  Physical activity:    Days per week: Not on file    Minutes per session: Not on file  . Stress: Not on file  Relationships  . Social connections:    Talks on phone: Not on file    Gets together: Not on file    Attends religious service: Not on file    Active member of club or organization: Not on file    Attends meetings of clubs or organizations: Not on file    Relationship status: Not on file  . Intimate partner violence:    Fear of current or ex partner: Not on file    Emotionally abused: Not on file    Physically abused: Not on file    Forced sexual activity: Not on file  Other Topics  Concern  . Not on file  Social History Narrative   Lives with husband Elta [place])  and son.   HS grad, [employer]	[place].    Smoke detector in the home, wears her seat belt.    No firearms.    Feels safe in relationship.    Allergies as of 10/26/2018   No Known Allergies     Medication List        Accurate as of 10/26/18  5:08 PM. Always use your most recent med list.          cholecalciferol 1000 units tablet Commonly known as:  VITAMIN D Take 1 tablet (1,000 Units total) by mouth daily.   Fish Oil 1000 MG Caps Take 1 capsule by mouth daily.   fluconazole 150 MG tablet Commonly known as:  DIFLUCAN Take 1 tablet (150 mg total) by mouth once for 1 dose. Rpt dose in 3 days   fluticasone 50 MCG/ACT nasal spray Commonly known as:  FLONASE Place 2 sprays into both nostrils daily.   hydrochlorothiazide 50 MG tablet Commonly known as:  HYDRODIURIL Take 1 tablet (50 mg total) by mouth daily.   metoprolol succinate 100 MG 24 hr tablet Commonly known as:  TOPROL-XL TAKE 1 TABLET BY MOUTH ONCE DAILY WITH OR IMMEDIATELY FOLLOWING A MEAL   metoprolol succinate 25 MG 24 hr tablet Commonly known as:  TOPROL-XL Take 1 tablet (25 mg total) by mouth daily.   miconazole 2 % cream Commonly known as:  MICOTIN Apply 1 application topically 2 (two) times daily.   multivitamin tablet Take 1 tablet by mouth daily.       All past medical history, surgical history, allergies, family history, immunizations andmedications were updated in the EMR today and reviewed under the history and medication portions of their EMR.     No results found for this or any previous visit (from the past 2160 hour(s)).  No results found.   ROS: 14 pt review of systems performed and negative (unless mentioned in an HPI)  Objective: BP 138/76 (BP Location: Left Arm, Patient Position: Sitting, Cuff Size: Large)   Pulse 96   Temp 98.2 F (36.8 C)   Resp 20   Ht '5\' 2"'  (1.575 m)   Wt 199 lb  (90.3 kg)   SpO2 96%   BMI 36.40 kg/m  Gen: Afebrile. No acute distress. Nontoxic in appearance, well-developed, well-nourished,  Obese, caucasian female.  HENT: AT. [place]. Bilateral TM visualized and normal in appearance, normal external auditory canal. MMM, no oral lesions, adequate dentition. Bilateral nares within normal limits. Throat without erythema, ulcerations or exudates. no Cough on exam, no hoarseness on exam. Eyes:Pupils Equal Round Reactive to light, Extraocular  movements intact,  Conjunctiva without redness, discharge or icterus. Neck/lymp/endocrine: Supple,no lymphadenopathy, no thyromegaly CV: RRR no murmur, noedema, +2/4 P posterior tibialis pulses. no carotid bruits. No JVD. Chest: CTAB, no wheeze or crackles. Rhonchi present. normal Respiratory effort. good Air movement. Abd: Soft. obese. NTND. BS present. Suprapubic fullness and tender mass just below umbilicus palpated.  Masses palpated. No hepatosplenomegaly. No rebound tenderness or guarding. Skin: No rashes, purpura or petechiae. Warm and well-perfused. Skin intact. Neuro/Msk: Normal gait. PERLA. EOMi. Alert. Oriented x3.  Cranial nerves II through XII intact. Muscle strength 5/5 upper/lower extremity. DTRs equal bilaterally. Psych: Normal affect, dress and demeanor. Normal speech. Normal thought content and judgment. Breasts: breasts appear normal, symmetrical, no tenderness on exam, no suspicious masses, no skin or nipple changes or axillary nodes. Density present.  GYN:  External genitalia with rather severe erythema, normal hair distribution, no lesions. Urethral meatus normal, no lesions. Vaginal mucosa pink, moist, normal rugae, no lesions. No cystocele or rectocele. cervix without lesions, moderate wht/greenish discharge. Bimanual exam revealed enlarged uterus.  No bladder/suprapubic fullness, masses or tenderness. No cervical motion tenderness. No adnexal fullness. Anus and perineum within normal limits, no  lesions.   No exam data present  Assessment/plan: Teresa Calderon is a 52 y.o. female present for CPE. Immunization due - Flu Vaccine QUAD 6+ mos PF IM (Fluarix Quad PF) - Varicella-zoster vaccine IM Essential hypertension, benign/Palpitations/Morbid obesity (HCC)/hypertriglycerides Stable. Refills provided on medications.  - CBC w/Diff - Comp Met (CMET) - TSH - metoprolol succinate (TOPROL-XL) 100 MG 24 hr tablet; TAKE 1 TABLET BY MOUTH ONCE DAILY WITH OR IMMEDIATELY FOLLOWING A MEAL  Dispense: 90 tablet; Refill: 1 - metoprolol succinate (TOPROL-XL) 25 MG 24 hr tablet; Take 1 tablet (25 mg total) by mouth daily.  Dispense: 90 tablet; Refill: 1 - hydrochlorothiazide (HYDRODIURIL) 50 MG tablet; Take 1 tablet (50 mg total) by mouth daily.  Dispense: 90 tablet; Refill: 1 - Lipid panel - TSH - diet ans exercise.  - f/u 6 mos.  Vitamin D deficiency - Vitamin D (25 hydroxy) Polydipsia/poluria: - A1c, cmp Tobacco abuse Pt was advised to stop smoking. She is not ready yet. AVS information was provided.  Cervical cancer screening - Cytology - PAP( [place]) Colon cancer screening - Ambulatory referral to Gastroenterology Breast cancer screening - MM 3D SCREEN BREAST BILATERAL; Future Vaginal irritation  3 min spent on smoking cessation. Appears possible yeast infection. Rather red with discharge, can not r/o trich or other STD--> cytology sent.  - diflucan prescribed, continue antifungal cream. Miconazole prescribed to ensure she is using an antifungal cream.  - fluconazole (DIFLUCAN) 150 MG tablet; Take 1 tablet (150 mg total) by mouth once for 1 dose. Rpt dose in 3 days  Dispense: 2 tablet; Refill: 0 Dysuria - possibly secondary to yeast infection- will r/o UTI with analysis today.  - Urinalysis, Routine w reflex microscopic Encounter for preventive health examination Patient was encouraged to exercise greater than 150 minutes a week. Patient was encouraged to choose a diet filled  with fresh fruits and vegetables, and lean meats. AVS provided to patient today for education/recommendation on gender specific health and safety maintenance. Colonoscopy: Never, ordered placed today. Mammogram: completed: overdue. Ordered today. SBE encouraged Cervical cancer screening: last pap: 01/31/2015, results: WNL/Neg HPV rpt 3-5 years. Pt would like completed today.  Immunizations: tdap 01/31/2015, Influenza provided today(encouraged yearly),  zostavax Shingrix #1 today. Nurse visit in 3 mos for #2. Infectious disease screening: HIV declined DEXA:N/A   Return  in about 1 year (around 10/27/2019) for CPE. 6 mos chronic medical conditions 3 mos nurse visit for shingrix #2   Electronically signed by: Howard Pouch, DO [place]

## 2018-10-27 ENCOUNTER — Telehealth: Payer: Self-pay | Admitting: Family Medicine

## 2018-10-27 ENCOUNTER — Other Ambulatory Visit: Payer: Self-pay | Admitting: Family Medicine

## 2018-10-27 DIAGNOSIS — N852 Hypertrophy of uterus: Secondary | ICD-10-CM

## 2018-10-27 DIAGNOSIS — R8290 Unspecified abnormal findings in urine: Secondary | ICD-10-CM

## 2018-10-27 DIAGNOSIS — R1900 Intra-abdominal and pelvic swelling, mass and lump, unspecified site: Secondary | ICD-10-CM

## 2018-10-27 LAB — LIPID PANEL
CHOL/HDL RATIO: 5.5 (calc) — AB (ref <5.0)
Cholesterol: 226 mg/dL — ABNORMAL HIGH (ref <200)
HDL: 41 mg/dL — ABNORMAL LOW (ref >50)
NON-HDL CHOLESTEROL (CALC): 185 mg/dL — AB (ref <130)
TRIGLYCERIDES: 416 mg/dL — AB (ref <150)

## 2018-10-27 LAB — CBC WITH DIFFERENTIAL/PLATELET
BASOS ABS: 132 {cells}/uL (ref 0–200)
Basophils Relative: 1.1 %
EOS ABS: 204 {cells}/uL (ref 15–500)
Eosinophils Relative: 1.7 %
HEMATOCRIT: 47.8 % — AB (ref 35.0–45.0)
HEMOGLOBIN: 16.9 g/dL — AB (ref 11.7–15.5)
LYMPHS ABS: 3516 {cells}/uL (ref 850–3900)
MCH: 32.1 pg (ref 27.0–33.0)
MCHC: 35.4 g/dL (ref 32.0–36.0)
MCV: 90.7 fL (ref 80.0–100.0)
MPV: 13.4 fL — ABNORMAL HIGH (ref 7.5–12.5)
Monocytes Relative: 7.6 %
NEUTROS ABS: 7236 {cells}/uL (ref 1500–7800)
Neutrophils Relative %: 60.3 %
Platelets: 205 10*3/uL (ref 140–400)
RBC: 5.27 10*6/uL — ABNORMAL HIGH (ref 3.80–5.10)
RDW: 11.4 % (ref 11.0–15.0)
Total Lymphocyte: 29.3 %
WBC mixed population: 912 cells/uL (ref 200–950)
WBC: 12 10*3/uL — ABNORMAL HIGH (ref 3.8–10.8)

## 2018-10-27 LAB — COMPREHENSIVE METABOLIC PANEL
AG Ratio: 1.3 (calc) (ref 1.0–2.5)
ALBUMIN MSPROF: 4.1 g/dL (ref 3.6–5.1)
ALT: 93 U/L — ABNORMAL HIGH (ref 6–29)
AST: 67 U/L — AB (ref 10–35)
Alkaline phosphatase (APISO): 199 U/L — ABNORMAL HIGH (ref 33–130)
BILIRUBIN TOTAL: 0.7 mg/dL (ref 0.2–1.2)
BUN: 13 mg/dL (ref 7–25)
CO2: 27 mmol/L (ref 20–32)
Calcium: 9.9 mg/dL (ref 8.6–10.4)
Chloride: 95 mmol/L — ABNORMAL LOW (ref 98–110)
Creat: 0.64 mg/dL (ref 0.50–1.05)
Globulin: 3.1 g/dL (calc) (ref 1.9–3.7)
Glucose, Bld: 456 mg/dL — ABNORMAL HIGH (ref 65–99)
POTASSIUM: 3.3 mmol/L — AB (ref 3.5–5.3)
Sodium: 135 mmol/L (ref 135–146)
Total Protein: 7.2 g/dL (ref 6.1–8.1)

## 2018-10-27 LAB — URINALYSIS, ROUTINE W REFLEX MICROSCOPIC
BILIRUBIN URINE: NEGATIVE
Hyaline Cast: NONE SEEN /LPF
KETONES UR: NEGATIVE
Nitrite: POSITIVE — AB
PH: 5.5 (ref 5.0–8.0)
Protein, ur: NEGATIVE
Specific Gravity, Urine: 1.041 — ABNORMAL HIGH (ref 1.001–1.03)

## 2018-10-27 LAB — HEMOGLOBIN A1C
HEMOGLOBIN A1C: 13.9 %{Hb} — AB (ref <5.7)
Mean Plasma Glucose: 352 (calc)
eAG (mmol/L): 19.5 (calc)

## 2018-10-27 LAB — TSH: TSH: 1.3 mIU/L

## 2018-10-27 LAB — VITAMIN D 25 HYDROXY (VIT D DEFICIENCY, FRACTURES): Vit D, 25-Hydroxy: 32 ng/mL (ref 30–100)

## 2018-10-27 MED ORDER — CEPHALEXIN 500 MG PO CAPS: 500.0000 mg | ORAL_CAPSULE | Freq: Four times a day (QID) | ORAL | 0 refills | Status: DC

## 2018-10-27 NOTE — Telephone Encounter (Signed)
Left detailed message with results and instructions on patient voice mail per DPR instructed patient to call and schedule an appt for early next week.

## 2018-10-27 NOTE — Telephone Encounter (Addendum)
Please inform patient the following information: I a few of her labs back. her urine appear she has a rather significant urinary tract infection. I have called in keflex abx to be taken every 6 hours for 7 days, start this ASAP. Drink plenty of fluids. Use the diflucan (yeast pill) as instructed as well, along with the cream for external itch relief.  - Her urine had bacteria and a great deal of sugar in it. Sugar in urine, yeast infection and UTI are all sometimes caused by diabetes. I am awaiting her blood test, but I suspect they will show new diabetes. We will call her when we get those results.  - her liver enzymes are also elevated, this could be for many reasons including infection or a more serious condition. - I have also placed the order for an ultrasound of her abdomen, as we discussed in her appt, to rule out anything concerning for her enlarged feeling  Uterus. This should be scheduled as soon as possible.   - please have her schedule a 30 min appt early next week to discuss all results and plan.

## 2018-10-27 NOTE — Telephone Encounter (Signed)
Changed order as directed to US pelvic ([id number]).

## 2018-10-28 ENCOUNTER — Telehealth: Payer: Self-pay | Admitting: Family Medicine

## 2018-10-28 DIAGNOSIS — E119 Type 2 diabetes mellitus without complications: Secondary | ICD-10-CM

## 2018-10-28 DIAGNOSIS — R945 Abnormal results of liver function studies: Secondary | ICD-10-CM

## 2018-10-28 DIAGNOSIS — E785 Hyperlipidemia, unspecified: Secondary | ICD-10-CM | POA: Insufficient documentation

## 2018-10-28 DIAGNOSIS — R7989 Other specified abnormal findings of blood chemistry: Secondary | ICD-10-CM | POA: Insufficient documentation

## 2018-10-28 DIAGNOSIS — R1900 Intra-abdominal and pelvic swelling, mass and lump, unspecified site: Secondary | ICD-10-CM

## 2018-10-28 DIAGNOSIS — E1169 Type 2 diabetes mellitus with other specified complication: Secondary | ICD-10-CM | POA: Insufficient documentation

## 2018-10-28 DIAGNOSIS — D72829 Elevated white blood cell count, unspecified: Secondary | ICD-10-CM

## 2018-10-28 DIAGNOSIS — R10819 Abdominal tenderness, unspecified site: Secondary | ICD-10-CM | POA: Insufficient documentation

## 2018-10-28 MED ORDER — SITAGLIP PHOS-METFORMIN HCL ER 100-1000 MG PO TB24: ORAL_TABLET | ORAL | 3 refills | Status: DC

## 2018-10-28 NOTE — Telephone Encounter (Signed)
Spoke with patient reviewed lab results and instructions. Patient verbalized understanding. 

## 2018-10-28 NOTE — Telephone Encounter (Signed)
Patient walked into office spoke with patient reviewed results and instructions with patient. Reviewed recommended dietary changes. Instructed patient to schedule an appoitment with Dr Claiborne Billings and to get abdominal US ASAP . Patient verbalized understanding.

## 2018-10-28 NOTE — Telephone Encounter (Signed)
Called patient left message for patient to return call 

## 2018-10-28 NOTE — Telephone Encounter (Signed)
Attempted to call pt this morning, I have asked her to call back today. There are labs and medications we need to discuss and it is important she understands. In addition to last message where she was advised of UTI, yeast med and scheduling the ultrasounds. Please advise her also- - her diabetes screen was positive with an a1c >13 (NL5.7). We have to get her started on a diabetes regimen immediately.  - increase water to 100 ounces a day - start DM med- janumet called in      - her sugar was rather significantly elevated, if dizziness, confusion, fatigue, blurred vision , headache--> go to ED- will need fluids and insulin to get her sugar back to normal range.    Summation of last message--if she did not get the message make sure it is reviewed with her - UTI- - keflex called in yesterady - yeast- take the diflucan and cream calle din at her appt - I have also placed the order for an ultrasound of her abdomen, as we discussed in her appt, to rule out anything concerning for her enlarged feeling  Uterus. This should be scheduled as soon as possible.   - please tell her, I know this is a lot to take in, but we can get her through it one step at a time. --> schedule a 30 min appt early next week to discuss all results and plan. Make this appt with her now.

## 2018-10-29 LAB — CYTOLOGY - PAP
Bacterial vaginitis: NEGATIVE
CANDIDA VAGINITIS: POSITIVE — AB
Chlamydia: NEGATIVE
Diagnosis: NEGATIVE
HPV: NOT DETECTED
NEISSERIA GONORRHEA: NEGATIVE
TRICH (WINDOWPATH): NEGATIVE

## 2018-10-30 ENCOUNTER — Ambulatory Visit (HOSPITAL_BASED_OUTPATIENT_CLINIC_OR_DEPARTMENT_OTHER)
Admission: RE | Admit: 2018-10-30 | Discharge: 2018-10-30 | Disposition: A | Discharge status: Home / Self Care | Payer: BC Managed Care – PPO | Source: Ambulatory Visit | Attending: Family Medicine | Admitting: Family Medicine

## 2018-10-30 DIAGNOSIS — N852 Hypertrophy of uterus: Secondary | ICD-10-CM

## 2018-10-30 DIAGNOSIS — R1900 Intra-abdominal and pelvic swelling, mass and lump, unspecified site: Secondary | ICD-10-CM | POA: Diagnosis present

## 2018-11-03 ENCOUNTER — Ambulatory Visit: Payer: BC Managed Care – PPO | Admitting: Family Medicine

## 2018-11-03 ENCOUNTER — Encounter: Payer: Self-pay | Admitting: Family Medicine

## 2018-11-03 VITALS — BP 127/70 | HR 83 | Resp 16 | Ht 62.0 in | Wt 196.0 lb

## 2018-11-03 DIAGNOSIS — R10819 Abdominal tenderness, unspecified site: Secondary | ICD-10-CM

## 2018-11-03 DIAGNOSIS — I77811 Abdominal aortic ectasia: Secondary | ICD-10-CM

## 2018-11-03 DIAGNOSIS — E119 Type 2 diabetes mellitus without complications: Secondary | ICD-10-CM

## 2018-11-03 DIAGNOSIS — I10 Essential (primary) hypertension: Secondary | ICD-10-CM

## 2018-11-03 DIAGNOSIS — K760 Fatty (change of) liver, not elsewhere classified: Secondary | ICD-10-CM

## 2018-11-03 DIAGNOSIS — R945 Abnormal results of liver function studies: Secondary | ICD-10-CM

## 2018-11-03 DIAGNOSIS — R7989 Other specified abnormal findings of blood chemistry: Secondary | ICD-10-CM

## 2018-11-03 DIAGNOSIS — E785 Hyperlipidemia, unspecified: Secondary | ICD-10-CM

## 2018-11-03 HISTORY — DX: Abdominal aortic ectasia: I77.811

## 2018-11-03 LAB — GLUCOSE, POCT (MANUAL RESULT ENTRY): POC Glucose: 264 mg/dl — AB (ref 70–99)

## 2018-11-03 MED ORDER — HYDROCHLOROTHIAZIDE 25 MG PO TABS: 25.0000 mg | ORAL_TABLET | Freq: Every day | ORAL | 1 refills | Status: DC

## 2018-11-03 MED ORDER — BLOOD GLUCOSE MONITOR KIT: PACK | 0 refills | Status: DC

## 2018-11-03 MED ORDER — MICONAZOLE NITRATE 2 % EX CREA: 1.0000 "application " | TOPICAL_CREAM | Freq: Two times a day (BID) | CUTANEOUS | 0 refills | Status: DC

## 2018-11-03 MED ORDER — FENOFIBRATE 145 MG PO TABS: 145.0000 mg | ORAL_TABLET | Freq: Every day | ORAL | 3 refills | Status: DC

## 2018-11-03 NOTE — Progress Notes (Signed)
Teresa Calderon , 1966/11/24, 52 y.o., female MRN: [id number] Patient Care Team    Relationship Specialty Notifications Start End  Natalia Leatherwood, DO PCP - General Family Medicine  01/17/16     Chief Complaint  Patient presents with  . Follow-up    diabetes     Subjective: Pt presents for an OV to discuss all labs, image studies and new diagnosis of   New onset type 2 diabetes mellitus (HCC) New diagnosis last week with a1c 13.9, started janumet and is tolerating. She endorses mild tingling in her feet, some of it is chronic from her back. Denies  non-healing wounds.  PNA series: discussed start of series, she would like to wait until next OV 2/2 to her arm is still sore from the shingrix.  Flu shot: UTD 10/2018 (recommneded yearly) BMP: 10/26/2018- elevated lft, low potassium 3.3, low chloride 95, Cr 0.65 Foot exam: completed today 11/03/2018 Eye exam: referred to eye doc today. She was educated on need for eye exam yearly with DM.  A1c: 13.9 (10/26/2018), glucose that day 456 (of note she also had a UTI and yeast infection- which are improving). Today 264 (not fasting).  - POCT glucose (manual entry) - Ambulatory referral to Ophthalmology - Urine Microalbumin w/creat. Ratio - referral to DM education.   Essential hypertension, benign/. Morbid obesity (HCC)/Ectatic abdominal aorta (HCC)/Elevated LFTs/Hepatic steatosis/Hyperlipidemia LDL goal <130 Pt reports compliance with Metoprolol 125 mg daily and HCTZ 50 mg daily. Blood pressures ranges at home not routinely checked. Patient denies chest pain, shortness of breath, dizziness or lower extremity edema.  BMP: (see above) CBC: leukocytosis, elevated hgb---heavy smoker.  TSH: 10/26/2018 WNL Lipids: triglycerides >400, 10/26/2018 RF: Hypertension, hyperlipidemia, Fhx coronary artery disease ABD [place] 10/2018: Maximum aortic diameter of 2.6 cm. Ectatic abdominal aorta at risk for aneurysm development. Recommend followup by ultrasound in  5 years. DUE 10/2023 - Sedimentation rate - C-reactive protein - HIV antibody (with reflex) - Hepatitis, Acute - hydrochlorothiazide (HYDRODIURIL) 25 MG tablet; Take 1 tablet (25 mg total) by mouth daily.  Dispense: 90 tablet; Refill: 1   Abdominal tenderness without rebound tenderness, unspecified location Reviewed [place]. And results No cause for her discomfort by [place].   Depression screen [facility] 2/9 10/26/2018 04/08/2018 10/10/2017 02/21/2016  Decreased Interest 0 0 0 0  Down, Depressed, Hopeless 0 0 0 0  PHQ - 2 Score 0 0 0 0    No Known Allergies Social History   Tobacco Use  . Smoking status: Current Every Day Smoker    Packs/day: 0.50    Years: 20.00    Pack years: 10.00    Types: Cigarettes  . Smokeless tobacco: Never Used  Substance Use Topics  . Alcohol use: No    Alcohol/week: 0.0 standard drinks   Past Medical History:  Diagnosis Date  . Abnormal LFTs 02/23/2012  . Anxiety   . Cervical cancer screening 02/19/2012  . Hyperglycemia   . Hyperlipidemia   . Hypertension   . Tobacco abuse 01/26/2012   Past Surgical History:  Procedure Laterality Date  . CESAREAN SECTION  1990   Family History  Problem Relation Age of Onset  . Hypertension Mother   . Hyperlipidemia Mother   . Coronary artery disease Mother 62       Died in her sleep.  Not a proven MI.  No history prior of this.  Marland Kitchen COPD Father        smoked  . Breast cancer Maternal Aunt  Allergies as of 11/03/2018   No Known Allergies     Medication List        Accurate as of 11/03/18  4:03 PM. Always use your most recent med list.          cephALEXin 500 MG capsule Commonly known as:  KEFLEX Take 1 capsule (500 mg total) by mouth 4 (four) times daily.   cholecalciferol 1000 units tablet Commonly known as:  VITAMIN D Take 1 tablet (1,000 Units total) by mouth daily.   Fish Oil 1000 MG Caps Take 1 capsule by mouth daily.   hydrochlorothiazide 50 MG tablet Commonly known as:  HYDRODIURIL Take 1  tablet (50 mg total) by mouth daily.   metoprolol succinate 100 MG 24 hr tablet Commonly known as:  TOPROL-XL TAKE 1 TABLET BY MOUTH ONCE DAILY WITH OR IMMEDIATELY FOLLOWING A MEAL   metoprolol succinate 25 MG 24 hr tablet Commonly known as:  TOPROL-XL Take 1 tablet (25 mg total) by mouth daily.   miconazole 2 % cream Commonly known as:  MICOTIN Apply 1 application topically 2 (two) times daily.   multivitamin tablet Take 1 tablet by mouth daily.   SitaGLIPtin-MetFORMIN HCl [phone] MG Tb24 0.5 tab QD 14 days, then 1 tab QD       All past medical history, surgical history, allergies, family history, immunizations andmedications were updated in the EMR today and reviewed under the history and medication portions of their EMR.     ROS: Negative, with the exception of above mentioned in HPI   Objective:  BP 127/70 (BP Location: Left Arm, Patient Position: Sitting, Cuff Size: Large)   Pulse 83   Resp 16   Ht 5\' 2"  (1.575 m)   Wt 196 lb (88.9 kg)   LMP 04/17/2015   SpO2 97%   BMI 35.85 kg/m  Body mass index is 35.85 kg/m. Gen: Afebrile. No acute distress. Nontoxic in appearance, well developed, well nourished. Obese caucasian female.  HENT: AT. [place].  MMM Eyes:Pupils Equal Round Reactive to light, Extraocular movements intact,  Conjunctiva without redness, discharge or icterus. Neck/lymp/endocrine: Supple,no lymphadenopathy CV: RRR no murmur, noedema Chest: CTAB, no wheeze or crackles. Good air movement, normal resp effort.  Abd: Soft. obese. TTP midline inferior to umbilicus and bilateral lower outer quadrants. . BS present. No rebound or guarding.  Skin: no rashes, purpura or petechiae.  Neuro: Normal gait. PERLA. EOMi. Alert. Oriented x3  Psych: Normal affect, dress and demeanor. Normal speech. Normal thought content and judgment. Diabetic Foot Exam - Simple   Simple Foot Form Diabetic Foot exam was performed with the following findings:  Yes 11/03/2018  4:35 PM    Visual Inspection No deformities, no ulcerations, no other skin breakdown bilaterally:  Yes Sensation Testing Intact to touch and monofilament testing bilaterally:  Yes Pulse Check Posterior Tibialis and Dorsalis pulse intact bilaterally:  Yes Comments      No exam data present No results found. Results for orders placed or performed in visit on 11/03/18 (from the past 24 hour(s))  POCT glucose (manual entry)     Status: Abnormal   Collection Time: 11/03/18  3:37 PM  Result Value Ref Range   POC Glucose 264 (A) 70 - 99 mg/dl    Assessment/Plan: Teresa Calderon is a 52 y.o. female present for OV for  New onset type 2 diabetes mellitus (HCC) New diagnosis last week with a1c 13.9, started janumet and is tolerating.  PNA series: discussed start of series, she would like  to wait until next OV 2/2 to her arm is still sore from the shingrix.  Flu shot: UTD 10/2018 (recommneded yearly) BMP: 10/26/2018- elevated lft, low potassium 3.3, low chloride 95, Cr 0.65 Foot exam: completed today 11/03/2018 Eye exam: referred to eye doc today. She was educated on need for eye exam yearly with DM.  A1c: 13.9 (10/26/2018), glucose that day 456 (of note she also had a UTI and yeast infection- which are improving). Today 264 (not fasting).  - POCT glucose (manual entry) - Ambulatory referral to Ophthalmology - Urine Microalbumin w/creat. Ratio - referral to DM education.  - education on glucose monitoring- monitor prescribed. Fasting and random testing/record/bring to next appt.  - education and guidance on diet and exercise recommendations.  - lengthy discussion on diabetes, many questions asked and answered. She is understandably overwhelmed the changes in her labs over the last year and new diagnosis. Much reassurance we are here for her and will walk her through the process.  F/u 3 mos  Essential hypertension, benign/. Morbid obesity (HCC)/Ectatic abdominal aorta (HCC)/Elevated LFTs/Hepatic  steatosis/Hyperlipidemia LDL goal <130 Stable. Continue  Metoprolol 125 mg daily and decrease HCTZ to  25mg  daily 2/2 low potassium.  - added fenofibrate today. Repeat lipids and CMP 3 mos.  - consider adding ACe or ARB in the future if able.  ABD [place] 10/2018: Maximum aortic diameter of 2.6 cm. Ectatic abdominal aorta at risk for aneurysm development. Recommend followup by ultrasound in 5 years. DUE 10/2023--> discussed in detail.  - Sedimentation rate - C-reactive protein - HIV antibody (with reflex) - Hepatitis, Acute - f/u 3 mos  Abdominal tenderness without rebound tenderness, unspecified location Reviewed [place]- no cause for discomfort or abnormal exam. Still has discomfort on exam today.  Discussed moving forward with CT and she Declined CT. Has referral to GI- encouraged her to call and make the appt, they have tried to contact her. This is very important with her lack of colon cancer screening, abd discomfort/abnl exam and elevated LFT. She reports understanding and will call to make appt. [facility] number was written down and provided to her and her husband today.    Reviewed expectations re: course of current medical issues.  Discussed self-management of symptoms.  Outlined signs and symptoms indicating need for more acute intervention.  Patient verbalized understanding and all questions were answered.  Patient received an After-Visit Summary.    Orders Placed This Encounter  Procedures  . Urine Microalbumin w/creat. ratio  . Sedimentation rate  . C-reactive protein  . HIV antibody (with reflex)  . Hepatitis, Acute  . POCT glucose (manual entry)    Greater than 40 minutes spent with patient, >50% of time spent face to face counseling, educating pt and her husband, reviewing multiple abnormal labs and imaging results and  coordinating care.    Note is dictated utilizing voice recognition software. Although note has been proof read prior to signing, occasional typographical  errors still can be missed. If any questions arise, please do not hesitate to call for verification.   electronically signed by:  Felix Pacini, DO  [facility] [place]

## 2018-11-03 NOTE — Patient Instructions (Addendum)
Take HCTZ 25 mg (instead of the 50 mg) . Split current pill in half. New bottle will be HCTZ 25 mg QD.   Start a medicine called fenofibrate. It has been prescribed for you. This is a fiber based medicine that helps lower the triglycerides.   I have referred you to diabetes education- ask if out of pocket cost.  I have also referred to eye doctor for diabetic eye exam. This should occur yearly with diabetes.  Your husband has the number to gastroenterology to schedule your colonoscopy.  Also make sure your mammogram is scheduled.   It is a lot to take in... We can get through it though.   Start testing fasting glucose every morning and write them down. Bring this to your next appt.   Start exercise 15 - 30 minutes 3x a week, then slowly work up to 150 minutes week.  Cut back on Sugar, potatoes, pasta, rice and flour. Using whole wheat products when you need to.   Water at least 80 ounces a day (even if flavored)   Diabetes Mellitus and Nutrition When you have diabetes (diabetes mellitus), it is very important to have healthy eating habits because your blood sugar (glucose) levels are greatly affected by what you eat and drink. Eating healthy foods in the appropriate amounts, at about the same times every day, can help you:  Control your blood glucose.  Lower your risk of heart disease.  Improve your blood pressure.  Reach or maintain a healthy weight.  Every person with diabetes is different, and each person has different needs for a meal plan. Your health care provider may recommend that you work with a diet and nutrition specialist (dietitian) to make a meal plan that is best for you. Your meal plan may vary depending on factors such as:  The calories you need.  The medicines you take.  Your weight.  Your blood glucose, blood pressure, and cholesterol levels.  Your activity level.  Other health conditions you have, such as heart or kidney disease.  How do carbohydrates  affect me? Carbohydrates affect your blood glucose level more than any other type of food. Eating carbohydrates naturally increases the amount of glucose in your blood. Carbohydrate counting is a method for keeping track of how many carbohydrates you eat. Counting carbohydrates is important to keep your blood glucose at a healthy level, especially if you use insulin or take certain oral diabetes medicines. It is important to know how many carbohydrates you can safely have in each meal. This is different for every person. Your dietitian can help you calculate how many carbohydrates you should have at each meal and for snack. Foods that contain carbohydrates include:  Bread, cereal, rice, pasta, and crackers.  Potatoes and corn.  Peas, beans, and lentils.  Milk and yogurt.  Fruit and juice.  Desserts, such as cakes, cookies, ice cream, and candy.  How does alcohol affect me? Alcohol can cause a sudden decrease in blood glucose (hypoglycemia), especially if you use insulin or take certain oral diabetes medicines. Hypoglycemia can be a life-threatening condition. Symptoms of hypoglycemia (sleepiness, dizziness, and confusion) are similar to symptoms of having too much alcohol. If your health care provider says that alcohol is safe for you, follow these guidelines:  Limit alcohol intake to no more than 1 drink per day for nonpregnant women and 2 drinks per day for men. One drink equals 12 oz of beer, 5 oz of wine, or 1 oz of hard liquor.  Do not drink on an empty stomach.  Keep yourself hydrated with water, diet soda, or unsweetened iced tea.  Keep in mind that regular soda, juice, and other mixers may contain a lot of sugar and must be counted as carbohydrates.  What are tips for following this plan? Reading food labels  Start by checking the serving size on the label. The amount of calories, carbohydrates, fats, and other nutrients listed on the label are based on one serving of the  food. Many foods contain more than one serving per package.  Check the total grams (g) of carbohydrates in one serving. You can calculate the number of servings of carbohydrates in one serving by dividing the total carbohydrates by 15. For example, if a food has 30 g of total carbohydrates, it would be equal to 2 servings of carbohydrates.  Check the number of grams (g) of saturated and trans fats in one serving. Choose foods that have low or no amount of these fats.  Check the number of milligrams (mg) of sodium in one serving. Most people should limit total sodium intake to less than 2,300 mg per day.  Always check the nutrition information of foods labeled as "low-fat" or "nonfat". These foods may be higher in added sugar or refined carbohydrates and should be avoided.  Talk to your dietitian to identify your daily goals for nutrients listed on the label. Shopping  Avoid buying canned, premade, or processed foods. These foods tend to be high in fat, sodium, and added sugar.  Shop around the outside edge of the grocery store. This includes fresh fruits and vegetables, bulk grains, fresh meats, and fresh dairy. Cooking  Use low-heat cooking methods, such as baking, instead of high-heat cooking methods like deep frying.  Cook using healthy oils, such as olive, canola, or sunflower oil.  Avoid cooking with butter, cream, or high-fat meats. Meal planning  Eat meals and snacks regularly, preferably at the same times every day. Avoid going long periods of time without eating.  Eat foods high in fiber, such as fresh fruits, vegetables, beans, and whole grains. Talk to your dietitian about how many servings of carbohydrates you can eat at each meal.  Eat 4-6 ounces of lean protein each day, such as lean meat, chicken, fish, eggs, or tofu. 1 ounce is equal to 1 ounce of meat, chicken, or fish, 1 egg, or 1/4 cup of tofu.  Eat some foods each day that contain healthy fats, such as avocado,  nuts, seeds, and fish. Lifestyle   Check your blood glucose regularly.  Exercise at least 30 minutes 5 or more days each week, or as told by your health care provider.  Take medicines as told by your health care provider.  Do not use any products that contain nicotine or tobacco, such as cigarettes and e-cigarettes. If you need help quitting, ask your health care provider.  Work with a Veterinary surgeon or diabetes educator to identify strategies to manage stress and any emotional and social challenges. What are some questions to ask my health care provider?  Do I need to meet with a diabetes educator?  Do I need to meet with a dietitian?  What number can I call if I have questions?  When are the best times to check my blood glucose? Where to find more information:  American Diabetes Association: diabetes.org/food-and-fitness/food  Academy of Nutrition and Dietetics: https://www.vargas.com/  [employer] of Diabetes and Digestive and Kidney Diseases (NIH): FindJewelers.cz Summary  A healthy meal plan  will help you control your blood glucose and maintain a healthy lifestyle.  Working with a diet and nutrition specialist (dietitian) can help you make a meal plan that is best for you.  Keep in mind that carbohydrates and alcohol have immediate effects on your blood glucose levels. It is important to count carbohydrates and to use alcohol carefully. This information is not intended to replace advice given to you by your health care provider. Make sure you discuss any questions you have with your health care provider. Document Released: 09/05/2005 Document Revised: 01/13/2017 Document Reviewed: 01/13/2017 Elsevier Interactive Patient Education  [employer]2018 Elsevier Inc.

## 2018-11-04 ENCOUNTER — Encounter: Payer: Self-pay | Admitting: Family Medicine

## 2018-11-04 LAB — MICROALBUMIN / CREATININE URINE RATIO
Creatinine,U: 109.8 mg/dL
MICROALB/CREAT RATIO: 1.1 mg/g (ref 0.0–30.0)
Microalb, Ur: 1.2 mg/dL (ref 0.0–1.9)

## 2018-11-04 LAB — SEDIMENTATION RATE: SED RATE: 25 mm/h (ref 0–30)

## 2018-11-04 LAB — C-REACTIVE PROTEIN: CRP: 1 mg/dL (ref 0.5–20.0)

## 2018-11-05 LAB — HEPATITIS PANEL, ACUTE
HEP B C IGM: NONREACTIVE
Hep A IgM: NONREACTIVE
Hepatitis B Surface Ag: NONREACTIVE
Hepatitis C Ab: NONREACTIVE
SIGNAL TO CUT-OFF: 0.03 (ref <1.00)

## 2018-11-05 LAB — HIV ANTIBODY (ROUTINE TESTING W REFLEX): HIV 1&2 Ab, 4th Generation: NONREACTIVE

## 2018-11-12 ENCOUNTER — Ambulatory Visit: Payer: Self-pay

## 2018-11-12 NOTE — Telephone Encounter (Signed)
Patient called in with c/o "no BM." She says "I haven't been to the bathroom since Sunday or Monday. It comes down, but will not come out. My rectum just feel like it's stretching, no pain. I asked about diet changes, medication changes, she says "I was just diagnosed as a diabetic, so I've changed my diet, I'm taking diabetic pills and a cholesterol pill. I'm sure I don't drink as much water as I should." I asked about other symptoms, she says "not really abdominal pain, just uncomfortable because I need to have a bowel movement. I did notice last night and today that my urine is slowly coming out, almost like a trickle slow stream. I feel like my bladder is emptying though." According to protocol, see PCP within 24 hours, appointment scheduled for tomorrow at 0845 with Dr. Claiborne BillingsKuneff, care advice given, patient verbalized understanding.   Reason for Disposition . Last bowel movement (BM) > 4 days ago  Answer Assessment - Initial Assessment Questions 1. STOOL PATTERN OR FREQUENCY: "How often do you pass bowel movements (BMs)?"  (Normal range: tid to q 3 days)  "When was the last BM passed?"       Sunday 2. STRAINING: "Do you have to strain to have a BM?"      Yes 3. RECTAL PAIN: "Does your rectum hurt when the stool comes out?" If so, ask: "Do you have hemorrhoids? How bad is the pain?"  (Scale 1-10; or mild, moderate, severe)     Yes a little, just stretching no pain 4. STOOL COMPOSITION: "Are the stools hard?"      Not really 5. BLOOD ON STOOLS: "Has there been any blood on the toilet tissue or on the surface of the BM?" If so, ask: "When was the last time?"      No 6. CHRONIC CONSTIPATION: "Is this a new problem for you?"  If no, ask: "How long have you had this problem?" (days, weeks, months)      Yes; since Monday 7. CHANGES IN DIET: "Have there been any recent changes in your diet?"      Yes-eating meats, salads, vegetables 8. MEDICATIONS: "Have you been taking any new medications?"  Yes-cholesterol pill and diabetic medications 9. LAXATIVES: "Have you been using any laxatives or enemas?"  If yes, ask "What, how often, and when was the last time?"     No 10. CAUSE: "What do you think is causing the constipation?"        I don't know 11. OTHER SYMPTOMS: "Do you have any other symptoms?" (e.g., abdominal pain, fever, vomiting)     Urine came out as a trickle last night and today slow stream 12. PREGNANCY: "Is there any chance you are pregnant?" "When was your last menstrual period?"       No  Protocols used: CONSTIPATION-A-AH

## 2018-11-13 ENCOUNTER — Encounter: Payer: Self-pay | Admitting: Family Medicine

## 2018-11-13 ENCOUNTER — Ambulatory Visit: Payer: BC Managed Care – PPO | Admitting: Family Medicine

## 2018-11-13 VITALS — BP 129/71 | HR 76 | Temp 98.0°F | Resp 20 | Ht 62.0 in | Wt 189.6 lb

## 2018-11-13 DIAGNOSIS — K5900 Constipation, unspecified: Secondary | ICD-10-CM | POA: Diagnosis not present

## 2018-11-13 DIAGNOSIS — R8290 Unspecified abnormal findings in urine: Secondary | ICD-10-CM | POA: Diagnosis not present

## 2018-11-13 DIAGNOSIS — R350 Frequency of micturition: Secondary | ICD-10-CM

## 2018-11-13 LAB — POC URINALSYSI DIPSTICK (AUTOMATED)
Blood, UA: NEGATIVE
GLUCOSE UA: NEGATIVE
Ketones, UA: NEGATIVE
Leukocytes, UA: NEGATIVE
NITRITE UA: NEGATIVE
PROTEIN UA: POSITIVE — AB
Spec Grav, UA: >=1.03 — AB (ref 1.010–1.025)
UROBILINOGEN UA: 0.2 U/dL
pH, UA: 6 (ref 5.0–8.0)

## 2018-11-13 MED ORDER — SENNOSIDES-DOCUSATE SODIUM 8.6-50 MG PO TABS: 1.0000 | ORAL_TABLET | Freq: Two times a day (BID) | ORAL | 5 refills | Status: DC

## 2018-11-13 MED ORDER — GLYCERIN (ADULT) 2 G RE SUPP: 1.0000 | RECTAL | 0 refills | Status: DC | PRN

## 2018-11-13 NOTE — Progress Notes (Signed)
Teresa Calderon , 1966/06/27, 52 y.o., female MRN: [id number] Patient Care Team    Relationship Specialty Notifications Start End  Ma Hillock, DO PCP - General Family Medicine  01/17/16     Chief Complaint  Patient presents with  . Constipation    had BM last night took MOM     Subjective: Pt presents for an OV with complaints of constipation x 4 days.  Pt  Reports she had not had a BM in 4 days. She did have a BM late last night. She reports she had a strain a great deal and her BM was a large amount. She also reports frequency in urination. She denies blood per rectum. She reports her stomach was hurting, but after her BM it does feel better. She still feels like she has a good deal more stool in her colon. She was recently seen for CPE and diagnosed with DM, had elevated LFT. On that exam she had mild TTP inferior to umbilicus with mass palpated. [place] of abd was ordered for eval of abd/pelvic and did not show cause. She declined CT of abdomen. She has been referred to GI- but she is "scared" to go. She is a smoker. She has changed her diet to higher fiber, vegetables and lean meats predominate, some fruits. She knows she is still not taking in enough water. She did start her Janumet and tolerating. Work up surrounding cause of elevated LFT have been unremarkable.  Prior note 11/03/2018: Reviewed [place]- no cause for discomfort or abnormal exam. Still has discomfort on exam today.  Discussed moving forward with CT and she Declined CT. Has referral to GI- encouraged her to call and make the appt, they have tried to contact her. This is very important with her lack of colon cancer screening, abd discomfort/abnl exam and elevated LFT. She reports understanding and will call to make appt. [facility] number was written down and provided to her and her husband today.    Depression screen [facility] 2/9 10/26/2018 04/08/2018 10/10/2017 02/21/2016  Decreased Interest 0 0 0 0  Down, Depressed, Hopeless 0 0 0 0  PHQ -  2 Score 0 0 0 0    No Known Allergies Social History   Tobacco Use  . Smoking status: Current Every Day Smoker    Packs/day: 0.50    Years: 20.00    Pack years: 10.00    Types: Cigarettes  . Smokeless tobacco: Never Used  Substance Use Topics  . Alcohol use: No    Alcohol/week: 0.0 standard drinks   Past Medical History:  Diagnosis Date  . Abnormal LFTs 02/23/2012  . Anxiety   . Cervical cancer screening 02/19/2012  . Hyperglycemia   . Hyperlipidemia   . Hypertension   . Tobacco abuse 01/26/2012   Past Surgical History:  Procedure Laterality Date  . CESAREAN SECTION  1990   Family History  Problem Relation Age of Onset  . Hypertension Mother   . Hyperlipidemia Mother   . Coronary artery disease Mother 5       Died in her sleep.  Not a proven MI.  No history prior of this.  Marland Kitchen COPD Father        smoked  . Breast cancer Maternal Aunt    Allergies as of 11/13/2018   No Known Allergies     Medication List        Accurate as of 11/13/18  9:23 AM. Always use your most recent med list.  blood glucose meter kit and supplies Kit Dispense based on patient and insurance preference. Use up to two times daily as directed. .   cholecalciferol 1000 units tablet Commonly known as:  VITAMIN D Take 1 tablet (1,000 Units total) by mouth daily.   fenofibrate 145 MG tablet Commonly known as:  TRICOR Take 1 tablet (145 mg total) by mouth daily.   Fish Oil 1000 MG Caps Take 1 capsule by mouth daily.   glycerin adult 2 g suppository Place 1 suppository rectally as needed for constipation.   hydrochlorothiazide 25 MG tablet Commonly known as:  HYDRODIURIL Take 1 tablet (25 mg total) by mouth daily.   metoprolol succinate 100 MG 24 hr tablet Commonly known as:  TOPROL-XL TAKE 1 TABLET BY MOUTH ONCE DAILY WITH OR IMMEDIATELY FOLLOWING A MEAL   metoprolol succinate 25 MG 24 hr tablet Commonly known as:  TOPROL-XL Take 1 tablet (25 mg total) by mouth daily.     miconazole 2 % cream Commonly known as:  MICOTIN Apply 1 application topically 2 (two) times daily.   multivitamin tablet Take 1 tablet by mouth daily.   senna-docusate 8.6-50 MG tablet Commonly known as:  Senokot-S Take 1-2 tablets by mouth 2 (two) times daily.   SitaGLIPtin-MetFORMIN HCl [phone] MG Tb24 0.5 tab QD 14 days, then 1 tab QD       All past medical history, surgical history, allergies, family history, immunizations andmedications were updated in the EMR today and reviewed under the history and medication portions of their EMR.     ROS: Negative, with the exception of above mentioned in HPI   Objective:  BP 129/71 (BP Location: Left Arm, Patient Position: Sitting, Cuff Size: Large)   Pulse 76   Temp 98 F (36.7 C)   Resp 20   Ht '5\' 2"'  (1.575 m)   Wt 189 lb 9.6 oz (86 kg)   LMP 04/17/2015   SpO2 97%   BMI 34.68 kg/m  Body mass index is 34.68 kg/m. Gen: Afebrile. No acute distress. Nontoxic in appearance, well developed, well nourished. Obese.  HENT: AT. [place].  MMM Eyes:Pupils Equal Round Reactive to light, Extraocular movements intact,  Conjunctiva without redness, discharge or icterus. CV: RRR  Chest: CTAB, no wheeze or crackles. Good air movement, normal resp effort.  Abd: Soft. obese.ND. Mild diffuse tenderness (same as prior) BS present. No rebound or guarding.  Skin: no rashes, purpura or petechiae.  Neuro:  Normal gait. PERLA. EOMi. Alert. Oriented x3  No exam data present No results found. Results for orders placed or performed in visit on 11/13/18 (from the past 24 hour(s))  POCT Urinalysis Dipstick (Automated)     Status: Abnormal   Collection Time: 11/13/18  9:21 AM  Result Value Ref Range   Color, UA orange    Clarity, UA cloudy    Glucose, UA Negative Negative   Bilirubin, UA 1+    Ketones, UA negative    Spec Grav, UA >=1.030 (A) 1.010 - 1.025   Blood, UA negative    pH, UA 6.0 5.0 - 8.0   Protein, UA Positive (A) Negative    Urobilinogen, UA 0.2 0.2 or 1.0 E.U./dL   Nitrite, UA negative    Leukocytes, UA Negative Negative    Assessment/Plan: Teresa Calderon is a 52 y.o. female present for OV for  Constipation, unspecified constipation type - strongly encouraged pt to schedule with gastroenterology. She has already been referred and just needs to call them to get schedule, which  she has been reluctant to do because she is "scared". Lengthy discussion today GI appt and colonoscopy and their routine in hopes to relieve her concerns. She stated she will call.  - Must increase her water consumption, urine very concentrated. She was encouraged to drink at LEAST 80 ounces daily.  - start senna-d, use glycerin supp this evening. Start miralax 1/2 cap to 1 cap daily in 8 ounces water. All instructions explained on when to take meds and tapering to her BM. - senna-docusate (SENOKOT-S) 8.6-50 MG tablet; Take 1-2 tablets by mouth 2 (two) times daily.  Dispense: 120 tablet; Refill: 5 - glycerin adult 2 g suppository; Place 1 suppository rectally as needed for constipation.  Dispense: 12 suppository; Refill: 0 - F/U PRN  Urinary frequency/Abnormal urine - Urine Culture- POCT + protein/+ bili.--> sent for culture. Recently finished abx for UTI.     Reviewed expectations re: course of current medical issues.  Discussed self-management of symptoms.  Outlined signs and symptoms indicating need for more acute intervention.  Patient verbalized understanding and all questions were answered.  Patient received an After-Visit Summary.    Orders Placed This Encounter  Procedures  . POCT Urinalysis Dipstick (Automated)    > 25 minutes spent with patient, >50% of time spent face to face counseling   Note is dictated utilizing voice recognition software. Although note has been proof read prior to signing, occasional typographical errors still can be missed. If any questions arise, please do not hesitate to call for verification.    electronically signed by:  Howard Pouch, DO  [place]

## 2018-11-13 NOTE — Patient Instructions (Addendum)
Take Senna-D daily as directed. Start off 2 tabs every 12 hours and once movements are routine cut back to 1-2 tab a day (once) Increase water to at least 80 ounces a day. Get a bottle that holds 40 ounces or so, so you are aware of daily consumption. Use the glycerin suppository tonight and when you feel backed up or have not had a BM in 3-4 days.  Try miralax 1 cap daily in 8 ounces of water. You can go up or down on the amount based on your stool consistency.  PLEASE, schedule your Gastroenterology appointment.      Constipation, Adult Constipation is when a person:  Poops (has a bowel movement) fewer times in a week than normal.  Has a hard time pooping.  Has poop that is dry, hard, or bigger than normal.  Follow these instructions at home: Eating and drinking   Eat foods that have a lot of fiber, such as: ? Fresh fruits and vegetables. ? Whole grains. ? Beans.  Eat less of foods that are high in fat, low in fiber, or overly processed, such as: ? [place] fries. ? Hamburgers. ? Cookies. ? Candy. ? Soda.  Drink enough fluid to keep your pee (urine) clear or pale yellow. General instructions  Exercise regularly or as told by your doctor.  Go to the restroom when you feel like you need to poop. Do not hold it in.  Take over-the-counter and prescription medicines only as told by your doctor. These include any fiber supplements.  Do pelvic floor retraining exercises, such as: ? Doing deep breathing while relaxing your lower belly (abdomen). ? Relaxing your pelvic floor while pooping.  Watch your condition for any changes.  Keep all follow-up visits as told by your doctor. This is important. Contact a doctor if:  You have pain that gets worse.  You have a fever.  You have not pooped for 4 days.  You throw up (vomit).  You are not hungry.  You lose weight.  You are bleeding from the anus.  You have thin, pencil-like poop (stool). Get help right away  if:  You have a fever, and your symptoms suddenly get worse.  You leak poop or have blood in your poop.  Your belly feels hard or bigger than normal (is bloated).  You have very bad belly pain.  You feel dizzy or you faint. This information is not intended to replace advice given to you by your health care provider. Make sure you discuss any questions you have with your health care provider. Document Released: 05/27/2008 Document Revised: 06/28/2016 Document Reviewed: 05/29/2016 Elsevier Interactive Patient Education  2018 [employer]

## 2018-11-14 LAB — URINE CULTURE
MICRO NUMBER: 91413883
SPECIMEN QUALITY:: ADEQUATE

## 2018-11-23 ENCOUNTER — Ambulatory Visit: Payer: Self-pay

## 2018-11-23 NOTE — Telephone Encounter (Signed)
Patient called in with c/o "urine problems." She says "I have this white stuff in my urine that looks like the sweet 'n low that I've been using. Do you know what it could be?" I advised I don't know what it could be. I asked about urinary symptoms, burning, pain, pressure, back pain, she says "no, I'm not having any of those. My urine sometimes is darker than other times, when I don't drink enough during the day." I asked about other symptoms, she denies. She says "I was just diagnosed with diabetes and don't know if I should be eating certain things. I know no sugar, but I am not so sure about some other things. I advised to come in for an appointment to discuss with Dr. Claiborne BillingsKuneff, she says "I really don't want to since I was already there." I advised I will send this note over to her and someone from the office will call with the recommendation, patient verbalized understanding.    Message from [place] sent at 11/23/2018 3:43 PM EST   Summary: white substance in urine    Patient is requesting a call back from nurse stating she was recently diagnosed with diabetes and has noticed a "sweet and low" type substance in urine- Patient states it looks like sugar and would like to know if it is actually sugar in her urine. Please advise.             Reason for Disposition . All other urine symptoms  Answer Assessment - Initial Assessment Questions 1. SYMPTOM: "What's the main symptom you're concerned about?" (e.g., frequency, incontinence) White substance in urine   2. PAIN: "Is there any pain?" If so, ask: "How bad is it?" (Scale: 1-10; mild, moderate, severe)     No 3. CAUSE: "What do you think is causing the symptoms?"     I don't know 4. OTHER SYMPTOMS: "Do you have any other symptoms?" (e.g., fever, flank pain, blood in urine, pain with urination)     No 5. PREGNANCY: "Is there any chance you are pregnant?" "When was your last menstrual period?"    No  Protocols used: URINARY  [facility]

## 2018-11-24 NOTE — Telephone Encounter (Signed)
Noted  

## 2018-11-24 NOTE — Telephone Encounter (Signed)
Phone call returned to patient, message left on voice mail informing patient that we would need to schedule her an appointment to treat urinary symptoms.

## 2018-11-27 ENCOUNTER — Encounter: Payer: Self-pay | Admitting: Family Medicine

## 2018-11-30 ENCOUNTER — Telehealth: Payer: Self-pay | Admitting: Family Medicine

## 2018-11-30 NOTE — Telephone Encounter (Unsigned)
Copied from [place] [phone]. Topic: Quick Communication - Rx Refill/Question >> Nov 30, 2018  3:29 PM Percell Belt A wrote: Medication: blood glucose meter kit and supplies KIT [[id number]]   Has the patient contacted their pharmacy? No- pt did not go and pick it up back in nov when it was called in so they put it back on the shelve.  They need it recalled in.  (Agent: If no, request that the patient contact the pharmacy for the refill.) (Agent: If yes, when and what did the pharmacy advise?)  Preferred Pharmacy (with phone number or street name): [facility], [address] [phone] (Phone)   Agent: Please be advised that RX refills may take up to 3 business days. We ask that you follow-up with your pharmacy.

## 2018-12-01 NOTE — Telephone Encounter (Signed)
[place].  Was advised that the order is still active; the pt. never picked up the meter.    Called pt. and informed of the above.  Advised she can pick up her glucose meter kit, and has 11 refills on lancets and test strips.  Verb. Understanding.

## 2018-12-04 ENCOUNTER — Ambulatory Visit: Payer: BC Managed Care – PPO | Admitting: Family Medicine

## 2018-12-04 ENCOUNTER — Encounter: Payer: Self-pay | Admitting: Family Medicine

## 2018-12-04 VITALS — BP 134/76 | HR 72 | Resp 16 | Ht 62.0 in | Wt 188.0 lb

## 2018-12-04 DIAGNOSIS — R8290 Unspecified abnormal findings in urine: Secondary | ICD-10-CM

## 2018-12-04 LAB — POCT URINALYSIS DIPSTICK
Glucose, UA: NEGATIVE
KETONES UA: NEGATIVE
Leukocytes, UA: NEGATIVE
Nitrite, UA: NEGATIVE
PH UA: 5.5 (ref 5.0–8.0)
Protein, UA: POSITIVE — AB
RBC UA: NEGATIVE
Spec Grav, UA: >=1.03 — AB (ref 1.010–1.025)
UROBILINOGEN UA: 1 U/dL

## 2018-12-04 NOTE — Patient Instructions (Signed)
Your urine is concentrated. Drink water instead of tea.  Keep up the good work with your diet and exercise.  Make sure to make the GI appt to evaluated your liver enzymes and eventually schedule your colonoscopy.    Please help us help you:  We are honored you have chosen [facility] your [facility]. Below you will find basic instructions that you may need to access in the future. Please help us help you by reading the instructions, which cover many of the frequent questions we experience.   Prescription refills and request:  -In order to allow more efficient response time, please call your pharmacy for all refills. They will forward the request electronically to us. This allows for the quickest possible response. Request left on a nurse line can take longer to refill, since these are checked as time allows between office patients and other phone calls.  - refill request can take up to 3-5 working days to complete.  - If request is sent electronically and request is appropiate, it is usually completed in 1-2 business days.  - all patients will need to be seen routinely for all chronic medical conditions requiring prescription medications (see follow-up below). If you are overdue for follow up on your condition, you will be asked to make an appointment and we will call in enough medication to cover you until your appointment (up to 30 days).  - all controlled substances will require a face to face visit to request/refill.  - if you desire your prescriptions to go through a new pharmacy, and have an active script at original pharmacy, you will need to call your pharmacy and have scripts transferred to new pharmacy. This is completed between the pharmacy locations and not by your provider.    Results: If any images or labs were ordered, it can take up to 1 week to get results depending on the test ordered and the lab/facility running and resulting the test. - Normal or stable results,  which do not need further discussion, may be released to your mychart immediately with attached note to you. A call may not be generated for normal results. Please make certain to sign up for mychart. If you have questions on how to activate your mychart you can call the front office.  - If your results need further discussion, our office will attempt to contact you via phone, and if unable to reach you after 2 attempts, we will release your abnormal result to your mychart with instructions.  - All results will be automatically released in mychart after 1 week.  - Your provider will provide you with explanation and instruction on all relevant material in your results. Please keep in mind, results and labs may appear confusing or abnormal to the untrained eye, but it does not mean they are actually abnormal for you personally. If you have any questions about your results that are not covered, or you desire more detailed explanation than what was provided, you should make an appointment with your provider to do so.   Our office handles many outgoing and incoming calls daily. If we have not contacted you within 1 week about your results, please check your mychart to see if there is a message first and if not, then contact our office.  In helping with this matter, you help decrease call volume, and therefore allow us to be able to respond to patients needs more efficiently.   Acute office visits (sick visit):  An acute visit is intended for a new problem and are scheduled in shorter time slots to allow schedule openings for patients with new problems. This is the appropriate visit to discuss a new problem. Problems will not be addressed by phone call or Echart message. Appointment is needed if requesting treatment. In order to provide you with excellent quality medical care with proper time for you to explain your problem, have an exam and receive treatment with instructions, these appointments should be limited  to one new problem per visit. If you experience a new problem, in which you desire to be addressed, please make an acute office visit, we save openings on the schedule to accommodate you. Please do not save your new problem for any other type of visit, let us take care of it properly and quickly for you.   Follow up visits:  Depending on your condition(s) your provider will need to see you routinely in order to provide you with quality care and prescribe medication(s). Most chronic conditions (Example: hypertension, Diabetes, depression/anxiety... etc), require visits a couple times a year. Your provider will instruct you on proper follow up for your personal medical conditions and history. Please make certain to make follow up appointments for your condition as instructed. Failing to do so could result in lapse in your medication treatment/refills. If you request a refill, and are overdue to be seen on a condition, we will always provide you with a 30 day script (once) to allow you time to schedule.    Medicare wellness (well visit): - we have a wonderful Nurse Maudie Mercury), that will meet with you and provide you will yearly medicare wellness visits. These visits should occur yearly (can not be scheduled less than 1 calendar year apart) and cover preventive health, immunizations, advance directives and screenings you are entitled to yearly through your medicare benefits. Do not miss out on your entitled benefits, this is when medicare will pay for these benefits to be ordered for you.  These are strongly encouraged by your provider and is the appropriate type of visit to make certain you are up to date with all preventive health benefits. If you have not had your medicare wellness exam in the last 12 months, please make certain to schedule one by calling the office and schedule your medicare wellness with Maudie Mercury as soon as possible.   Yearly physical (well visit):  - Adults are recommended to be seen yearly for  physicals. Check with your insurance and date of your last physical, most insurances require one calendar year between physicals. Physicals include all preventive health topics, screenings, medical exam and labs that are appropriate for gender/age and history. You may have fasting labs needed at this visit. This is a well visit (not a sick visit), new problems should not be covered during this visit (see acute visit).  - Pediatric patients are seen more frequently when they are younger. Your provider will advise you on well child visit timing that is appropriate for your their age. - This is not a medicare wellness visit. Medicare wellness exams do not have an exam portion to the visit. Some medicare companies allow for a physical, some do not allow a yearly physical. If your medicare allows a yearly physical you can schedule the medicare wellness with our nurse Maudie Mercury and have your physical with your provider after, on the same day. Please check with insurance for your full benefits.   Late Policy/No Shows:  - all new patients should arrive  15-30 minutes earlier than appointment to allow [place] time  to  obtain all personal demographics,  insurance information and for you to complete office paperwork. - All established patients should arrive 10-15 minutes earlier than appointment time to update all information and be checked in .  - In our best efforts to run on time, if you are late for your appointment you will be asked to either reschedule or if able, we will work you back into the schedule. There will be a wait time to work you back in the schedule,  depending on availability.  - If you are unable to make it to your appointment as scheduled, please call 24 hours ahead of time to allow [place] to fill the time slot with someone else who needs to be seen. If you do not cancel your appointment ahead of time, you may be charged a no show fee.

## 2018-12-04 NOTE — Progress Notes (Signed)
Teresa Calderon , 1966-04-21, 52 y.o., female MRN: [id number] Patient Care Team    Relationship Specialty Notifications Start End  Ma Hillock, DO PCP - General Family Medicine  01/17/16     Chief Complaint  Patient presents with  . urine question    consistency abnormal,      Subjective: Pt presents for an OV with complaints of seeing powdery substance in her urine of 1 week duration.  Associated symptoms include nothing. She denies dysuria, fever, chills, nausea or abd pain. She has not scheduled with the GI doctor yet for her elevated LFT and colonoscopy screening.  She admits she still is not drinking enough water. She is not allowed drinks near her work station- she drinks a 32 ounce unsweetened tea daily.  Pt has tried drinking more water to ease their symptoms.   Depression screen [facility] 2/9 10/26/2018 04/08/2018 10/10/2017 02/21/2016  Decreased Interest 0 0 0 0  Down, Depressed, Hopeless 0 0 0 0  PHQ - 2 Score 0 0 0 0    No Known Allergies Social History   Tobacco Use  . Smoking status: Current Every Day Smoker    Packs/day: 0.50    Years: 20.00    Pack years: 10.00    Types: Cigarettes  . Smokeless tobacco: Never Used  Substance Use Topics  . Alcohol use: No    Alcohol/week: 0.0 standard drinks   Past Medical History:  Diagnosis Date  . Abnormal LFTs 02/23/2012  . Anxiety   . Cervical cancer screening 02/19/2012  . Hyperglycemia   . Hyperlipidemia   . Hypertension   . Tobacco abuse 01/26/2012   Past Surgical History:  Procedure Laterality Date  . CESAREAN SECTION  1990   Family History  Problem Relation Age of Onset  . Hypertension Mother   . Hyperlipidemia Mother   . Coronary artery disease Mother 67       Died in her sleep.  Not a proven MI.  No history prior of this.  Marland Kitchen COPD Father        smoked  . Breast cancer Maternal Aunt    Allergies as of 12/04/2018   No Known Allergies     Medication List       Accurate as of December 04, 2018  3:28 PM.  Always use your most recent med list.        blood glucose meter kit and supplies Kit Dispense based on patient and insurance preference. Use up to two times daily as directed. .   cholecalciferol 1000 units tablet Commonly known as:  VITAMIN D Take 1 tablet (1,000 Units total) by mouth daily.   fenofibrate 145 MG tablet Commonly known as:  TRICOR Take 1 tablet (145 mg total) by mouth daily.   Fish Oil 1000 MG Caps Take 1 capsule by mouth daily.   glycerin adult 2 g suppository Place 1 suppository rectally as needed for constipation.   hydrochlorothiazide 25 MG tablet Commonly known as:  HYDRODIURIL Take 1 tablet (25 mg total) by mouth daily.   metoprolol succinate 100 MG 24 hr tablet Commonly known as:  TOPROL-XL TAKE 1 TABLET BY MOUTH ONCE DAILY WITH OR IMMEDIATELY FOLLOWING A MEAL   metoprolol succinate 25 MG 24 hr tablet Commonly known as:  TOPROL-XL Take 1 tablet (25 mg total) by mouth daily.   miconazole 2 % cream Commonly known as:  MICONAZOLE ANTIFUNGAL Apply 1 application topically 2 (two) times daily.   multivitamin tablet Take 1 tablet  by mouth daily.   senna-docusate 8.6-50 MG tablet Commonly known as:  Senokot-S Take 1-2 tablets by mouth 2 (two) times daily.   SitaGLIPtin-MetFORMIN HCl [phone] MG Tb24 0.5 tab QD 14 days, then 1 tab QD       All past medical history, surgical history, allergies, family history, immunizations andmedications were updated in the EMR today and reviewed under the history and medication portions of their EMR.     ROS: Negative, with the exception of above mentioned in HPI   Objective:  BP 134/76 (BP Location: Left Arm, Patient Position: Sitting, Cuff Size: Normal)   Pulse 72   Resp 16   Ht 5' 2" (1.575 m)   Wt 188 lb (85.3 kg)   LMP 04/17/2015   SpO2 98%   BMI 34.39 kg/m  Body mass index is 34.39 kg/m. Gen: Afebrile. No acute distress. Nontoxic in appearance, well developed, well nourished.  HENT: AT. [place].  MMM Eyes:Pupils Equal Round Reactive to light, Extraocular movements intact,  Conjunctiva without redness, discharge or icterus. Neck/lymp/endocrine: Supple,no lymphadenopathy CV: RRR, no edema Chest: CTAB, no wheeze or crackles. .  Abd: Soft. obese. NTND. BS present.  MSK: No CVA tenderness Skin: no rashes, purpura or petechiae.  Neuro: Normal gait. PERLA. EOMi. Alert. Oriented x3  Psych: Normal affect, dress and demeanor. Normal speech. Normal thought content and judgment.  No exam data present No results found. Results for orders placed or performed in visit on 12/04/18 (from the past 24 hour(s))  POCT urinalysis dipstick     Status: Abnormal   Collection Time: 12/04/18  3:17 PM  Result Value Ref Range   Color, UA orange    Clarity, UA hazy    Glucose, UA Negative Negative   Bilirubin, UA 1+    Ketones, UA negative    Spec Grav, UA >=1.030 (A) 1.010 - 1.025   Blood, UA negative    pH, UA 5.5 5.0 - 8.0   Protein, UA Positive (A) Negative   Urobilinogen, UA 1.0 0.2 or 1.0 E.U./dL   Nitrite, UA negative    Leukocytes, UA Negative Negative   Appearance     Odor      Assessment/Plan: Teresa Calderon is a 52 y.o. female present for OV for  Abnormal urine - Urine is similar to results a few weeks ago. + bili and protein. No signs of infection and no glucose.  - It is orange in color--> must drink water- can not seem to stress this enough.  - Follow with GI - she has been referred, encourage multiple times, provided with the number in person to her and her husband. - POCT urinalysis dipstick - No signs of infection or glucose.    Reviewed expectations re: course of current medical issues.  Discussed self-management of symptoms.  Outlined signs and symptoms indicating need for more acute intervention.  Patient verbalized understanding and all questions were answered.  Patient received an After-Visit Summary.    Orders Placed This Encounter  Procedures  . POCT  urinalysis dipstick     Note is dictated utilizing voice recognition software. Although note has been proof read prior to signing, occasional typographical errors still can be missed. If any questions arise, please do not hesitate to call for verification.   electronically signed by:  Howard Pouch, DO  [place]

## 2019-01-26 ENCOUNTER — Ambulatory Visit: Payer: BC Managed Care – PPO

## 2019-02-03 ENCOUNTER — Ambulatory Visit: Payer: BC Managed Care – PPO | Admitting: Family Medicine

## 2019-02-08 ENCOUNTER — Ambulatory Visit: Payer: BC Managed Care – PPO | Admitting: Family Medicine

## 2019-02-09 ENCOUNTER — Encounter: Payer: Self-pay | Admitting: Family Medicine

## 2019-02-09 ENCOUNTER — Ambulatory Visit: Payer: BC Managed Care – PPO | Admitting: Family Medicine

## 2019-02-09 VITALS — BP 132/82 | HR 82 | Temp 98.6°F | Resp 16 | Ht 62.0 in | Wt 175.2 lb

## 2019-02-09 DIAGNOSIS — I10 Essential (primary) hypertension: Secondary | ICD-10-CM

## 2019-02-09 DIAGNOSIS — I77811 Abdominal aortic ectasia: Secondary | ICD-10-CM

## 2019-02-09 DIAGNOSIS — Z23 Encounter for immunization: Secondary | ICD-10-CM | POA: Diagnosis not present

## 2019-02-09 DIAGNOSIS — E119 Type 2 diabetes mellitus without complications: Secondary | ICD-10-CM

## 2019-02-09 DIAGNOSIS — R002 Palpitations: Secondary | ICD-10-CM | POA: Diagnosis not present

## 2019-02-09 DIAGNOSIS — R945 Abnormal results of liver function studies: Secondary | ICD-10-CM

## 2019-02-09 DIAGNOSIS — Z5320 Procedure and treatment not carried out because of patient's decision for unspecified reasons: Secondary | ICD-10-CM

## 2019-02-09 DIAGNOSIS — R7989 Other specified abnormal findings of blood chemistry: Secondary | ICD-10-CM

## 2019-02-09 DIAGNOSIS — E785 Hyperlipidemia, unspecified: Secondary | ICD-10-CM

## 2019-02-09 LAB — POCT GLYCOSYLATED HEMOGLOBIN (HGB A1C)
HBA1C, POC (CONTROLLED DIABETIC RANGE): 5.4 % (ref 0.0–7.0)
HEMOGLOBIN A1C: 5.4 % (ref 4.0–5.6)
HEMOGLOBIN A1C: 5.4 % (ref 4.0–5.6)
HbA1c, POC (prediabetic range): 5.4 % — AB (ref 5.7–6.4)

## 2019-02-09 MED ORDER — METOPROLOL SUCCINATE ER 100 MG PO TB24: ORAL_TABLET | ORAL | 1 refills | Status: DC

## 2019-02-09 MED ORDER — HYDROCHLOROTHIAZIDE 25 MG PO TABS: 25.0000 mg | ORAL_TABLET | Freq: Every day | ORAL | 1 refills | Status: DC

## 2019-02-09 MED ORDER — METOPROLOL SUCCINATE ER 25 MG PO TB24: 25.0000 mg | ORAL_TABLET | Freq: Every day | ORAL | 1 refills | Status: DC

## 2019-02-09 NOTE — Progress Notes (Signed)
Teresa Calderon , 05-02-1966, 53 y.o., female MRN: [id number] Patient Care Team    Relationship Specialty Notifications Start End  Ma Hillock, DO PCP - General Family Medicine  01/17/16     Chief Complaint  Patient presents with  . Diabetes    Pt has log of blood sugars   . Hypertension     Subjective: Pt presents for an OV for follow-up on chronic medical conditions.    type 2 diabetes mellitus ([place]) Diagnosed 10/26/2018 with a A1c 13.9.  Reports compliance with Janumet [phone] mg daily.  She brings with her glucose logs which range between 95-114.  Patient denies dizziness, hyperglycemic or hypoglycemic events. Patient denies numbness, tingling in the extremities or nonhealing wounds of feet.  PNA series: discussed start of series, she would like to wait until next OV-attempting not to overwhelm her  Flu shot: UTD 10/2018 (recommneded yearly) BMP: 10/26/2018- elevated lft, low potassium 3.3, low chloride 95, Cr 0.65 Foot exam: completed today 11/03/2018 Eye exam: referred to eye doc 11/03/2018. She was educated on need for eye exam yearly with DM.  A1c: 13.9 (10/26/2018)--> 5.4 today--> some job.  She has also lost 55 pounds since April 2019.   Essential hypertension, benign/. Morbid obesity (HCC)/Ectatic abdominal aorta (HCC)/Elevated LFTs/Hepatic steatosis/Hyperlipidemia LDL goal <130/statin declined Patient reports compliance with metoprolol 125 mg daily and HCTZ 25 mg daily.  He is taking the fenofibrate and using the fish oil supplementation. Patient denies chest pain, shortness of breath, dizziness or lower extremity edema.  SHe does still have palpitations-without symptoms. Pt reports compliance with Metoprolol 125 mg daily and HCTZ 50 mg daily.  BMP: (see above) CBC: leukocytosis, elevated hgb---heavy smoker.  TSH: 10/26/2018 WNL Lipids: triglycerides >400, 10/26/2018 RF: Hypertension, hyperlipidemia, Fhx coronary artery disease ABD [place] 10/2018: Maximum aortic diameter of  2.6 cm. Ectatic abdominal aorta at risk for aneurysm development. Recommend followup by ultrasound in 5 years. DUE 10/2023 - Sedimentation rate - C-reactive protein - HIV antibody (with reflex) - Hepatitis, Acute - hydrochlorothiazide (HYDRODIURIL) 25 MG tablet; Take 1 tablet (25 mg total) by mouth daily.  Dispense: 90 tablet; Refill: 1   Depression screen [facility] 2/9 10/26/2018 04/08/2018 10/10/2017 02/21/2016  Decreased Interest 0 0 0 0  Down, Depressed, Hopeless 0 0 0 0  PHQ - 2 Score 0 0 0 0    No Known Allergies Social History   Tobacco Use  . Smoking status: Current Every Day Smoker    Packs/day: 0.50    Years: 20.00    Pack years: 10.00    Types: Cigarettes  . Smokeless tobacco: Never Used  Substance Use Topics  . Alcohol use: No    Alcohol/week: 0.0 standard drinks   Past Medical History:  Diagnosis Date  . Abnormal LFTs 02/23/2012  . Anxiety   . Cervical cancer screening 02/19/2012  . Hyperglycemia   . Hyperlipidemia   . Hypertension   . Tobacco abuse 01/26/2012   Past Surgical History:  Procedure Laterality Date  . CESAREAN SECTION  1990   Family History  Problem Relation Age of Onset  . Hypertension Mother   . Hyperlipidemia Mother   . Coronary artery disease Mother 60       Died in her sleep.  Not a proven MI.  No history prior of this.  Marland Kitchen COPD Father        smoked  . Breast cancer Maternal Aunt    Allergies as of 02/09/2019   No Known Allergies  Medication List       Accurate as of February 09, 2019  3:22 PM. Always use your most recent med list.        blood glucose meter kit and supplies Kit Dispense based on patient and insurance preference. Use up to two times daily as directed. .   cholecalciferol 1000 units tablet Commonly known as:  VITAMIN D Take 1 tablet (1,000 Units total) by mouth daily.   fenofibrate 145 MG tablet Commonly known as:  TRICOR Take 1 tablet (145 mg total) by mouth daily.   Fish Oil 1000 MG Caps Take 1 capsule by  mouth daily.   glycerin adult 2 g suppository Place 1 suppository rectally as needed for constipation.   hydrochlorothiazide 25 MG tablet Commonly known as:  HYDRODIURIL Take 1 tablet (25 mg total) by mouth daily.   metoprolol succinate 100 MG 24 hr tablet Commonly known as:  TOPROL-XL TAKE 1 TABLET BY MOUTH ONCE DAILY WITH OR IMMEDIATELY FOLLOWING A MEAL   metoprolol succinate 25 MG 24 hr tablet Commonly known as:  TOPROL-XL Take 1 tablet (25 mg total) by mouth daily.   miconazole 2 % cream Commonly known as:  MICONAZOLE ANTIFUNGAL Apply 1 application topically 2 (two) times daily.   multivitamin tablet Take 1 tablet by mouth daily.   senna-docusate 8.6-50 MG tablet Commonly known as:  Senokot-S Take 1-2 tablets by mouth 2 (two) times daily.   SitaGLIPtin-MetFORMIN HCl [phone] MG Tb24 0.5 tab QD 14 days, then 1 tab QD       All past medical history, surgical history, allergies, family history, immunizations andmedications were updated in the EMR today and reviewed under the history and medication portions of their EMR.     ROS: Negative, with the exception of above mentioned in HPI   Objective:  BP 132/82 (BP Location: Left Arm, Patient Position: Sitting, Cuff Size: Normal)   Pulse 82   Temp 98.6 F (37 C) (Oral)   Resp 16   Ht '5\' 2"'  (1.575 m)   Wt 175 lb 4 oz (79.5 kg)   LMP 04/17/2015   SpO2 99%   BMI 32.05 kg/m  Body mass index is 32.05 kg/m. Gen: Afebrile. No acute distress.  Nontoxic in presentation.  Very pleasant obese Caucasian female. HENT: AT. [place]. MMM.  Eyes:Pupils Equal Round Reactive to light, Extraocular movements intact,  Conjunctiva without redness, discharge or icterus. CV: RRR no murmur, no edema, +2/4 P posterior tibialis pulses Chest: CTAB, no wheeze or crackles Skin: No rashes, purpura or petechiae.  Warm well perfused, intact. Neuro:  Normal gait. PERLA. EOMi. Alert. Oriented x3   No exam data present No results found. No results  found for this or any previous visit (from the past 24 hour(s)).  Assessment/Plan: Teresa Calderon is a 53 y.o. female present for OV for  type 2 diabetes mellitus ([place]) She did an amazing job taking her medication daily, monitoring her glucose levels, attempting to exercise and watch her diet.  She brought her A1c down from 13 to 5.4 and a little over 3 months time. -Advised her to continue the good work, if her A1c continues to be below 5.5 on next visit we will consider decreasing medication dose.  Will monitor for any signs of hypoglycemia. PNA series: discussed start of series, she would like to wait until next OV-attempting not to overwhelm her  Flu shot: UTD 10/2018 (recommneded yearly) BMP: 10/26/2018- elevated lft, low potassium 3.3, low chloride 95, Cr 0.65 Foot exam:  completed today 11/03/2018 Eye exam: referred to eye doc 11/03/2018. She was educated on need for eye exam yearly with DM.  - POCT glucose (manual entry) -Continue to monitor fasting and random testing/record/bring to next appt.  - education and guidance on diet and exercise recommendations.  F/u 3 mos  Essential hypertension, benign/. Morbid obesity (HCC)/Ectatic abdominal aorta (HCC)/Elevated LFTs/Hepatic steatosis/Hyperlipidemia LDL goal <130 Stable. continue  Metoprolol 125 mg daily and decrease HCTZ to  62m daily 2/2 low potassium.  - added fenofibrate today.  Declines statin.  Cholesterol remains elevated will attempt to start a low-dose statin on her again. - consider adding ACe or ARB in the future if able--> if elevated or borderline next visit consider adding low-dose lisinopril/HCTZ combo ABD [place]: Maximum aortic diameter of 2.6 cm. Ectatic abdominal aorta at risk for aneurysm development. Recommend followup by ultrasound in 5 years. DUE 10/2023--> discussed in detail.  - f/u 3 mos  Shingrix No. 2 provided today.  Completed series   Reviewed expectations re: course of current medical  issues.  Discussed self-management of symptoms.  Outlined signs and symptoms indicating need for more acute intervention.  Patient verbalized understanding and all questions were answered.  Patient received an After-Visit Summary.    Orders Placed This Encounter  Procedures  . POCT glycosylated hemoglobin (Hb A1C)    > 25 minutes spent with patient, >50% of time spent face to face counseling      Note is dictated utilizing voice recognition software. Although note has been proof read prior to signing, occasional typographical errors still can be missed. If any questions arise, please do not hesitate to call for verification.   electronically signed by:  RHoward Pouch DO  [place]

## 2019-02-09 NOTE — Patient Instructions (Addendum)
Lab appt next week - fasting 6-9 hours. Water and pills are ok.    Your a1c is amazing!!!! 5.4 today. If you continue loosing weight- we may be able to lower doses of meds next visit.   Great job!!!   Please help [place] help you:  We are honored you have chosen [facility] for your Primary Care home. Below you will find basic instructions that you may need to access in the future. Please help [place] help you by reading the instructions, which cover many of the frequent questions we experience.   Prescription refills and request:  -In order to allow more efficient response time, please call your pharmacy for all refills. They will forward the request electronically to [place]. This allows for the quickest possible response. Request left on a nurse line can take longer to refill, since these are checked as time allows between office patients and other phone calls.  - refill request can take up to 3-5 working days to complete.  - If request is sent electronically and request is appropiate, it is usually completed in 1-2 business days.  - all patients will need to be seen routinely for all chronic medical conditions requiring prescription medications (see follow-up below). If you are overdue for follow up on your condition, you will be asked to make an appointment and we will call in enough medication to cover you until your appointment (up to 30 days).  - all controlled substances will require a face to face visit to request/refill.  - if you desire your prescriptions to go through a new pharmacy, and have an active script at original pharmacy, you will need to call your pharmacy and have scripts transferred to new pharmacy. This is completed between the pharmacy locations and not by your provider.    Results: If any images or labs were ordered, it can take up to 1 week to get results depending on the test ordered and the lab/facility running and resulting the test. - Normal or stable results, which do  not need further discussion, may be released to your mychart immediately with attached note to you. A call may not be generated for normal results. Please make certain to sign up for mychart. If you have questions on how to activate your mychart you can call the front office.  - If your results need further discussion, our office will attempt to contact you via phone, and if unable to reach you after 2 attempts, we will release your abnormal result to your mychart with instructions.  - All results will be automatically released in mychart after 1 week.  - Your provider will provide you with explanation and instruction on all relevant material in your results. Please keep in mind, results and labs may appear confusing or abnormal to the untrained eye, but it does not mean they are actually abnormal for you personally. If you have any questions about your results that are not covered, or you desire more detailed explanation than what was provided, you should make an appointment with your provider to do so.   Our office handles many outgoing and incoming calls daily. If we have not contacted you within 1 week about your results, please check your mychart to see if there is a message first and if not, then contact our office.  In helping with this matter, you help decrease call volume, and therefore allow [place] to be able to respond to patients needs more efficiently.   Acute office visits (  sick visit):  An acute visit is intended for a new problem and are scheduled in shorter time slots to allow schedule openings for patients with new problems. This is the appropriate visit to discuss a new problem. Problems will not be addressed by phone call or Echart message. Appointment is needed if requesting treatment. In order to provide you with excellent quality medical care with proper time for you to explain your problem, have an exam and receive treatment with instructions, these appointments should be limited to one  new problem per visit. If you experience a new problem, in which you desire to be addressed, please make an acute office visit, we save openings on the schedule to accommodate you. Please do not save your new problem for any other type of visit, let us take care of it properly and quickly for you.   Follow up visits:  Depending on your condition(s) your provider will need to see you routinely in order to provide you with quality care and prescribe medication(s). Most chronic conditions (Example: hypertension, Diabetes, depression/anxiety... etc), require visits a couple times a year. Your provider will instruct you on proper follow up for your personal medical conditions and history. Please make certain to make follow up appointments for your condition as instructed. Failing to do so could result in lapse in your medication treatment/refills. If you request a refill, and are overdue to be seen on a condition, we will always provide you with a 30 day script (once) to allow you time to schedule.    Medicare wellness (well visit): - we have a wonderful Nurse Maudie Mercury), that will meet with you and provide you will yearly medicare wellness visits. These visits should occur yearly (can not be scheduled less than 1 calendar year apart) and cover preventive health, immunizations, advance directives and screenings you are entitled to yearly through your medicare benefits. Do not miss out on your entitled benefits, this is when medicare will pay for these benefits to be ordered for you.  These are strongly encouraged by your provider and is the appropriate type of visit to make certain you are up to date with all preventive health benefits. If you have not had your medicare wellness exam in the last 12 months, please make certain to schedule one by calling the office and schedule your medicare wellness with Maudie Mercury as soon as possible.   Yearly physical (well visit):  - Adults are recommended to be seen yearly for  physicals. Check with your insurance and date of your last physical, most insurances require one calendar year between physicals. Physicals include all preventive health topics, screenings, medical exam and labs that are appropriate for gender/age and history. You may have fasting labs needed at this visit. This is a well visit (not a sick visit), new problems should not be covered during this visit (see acute visit).  - Pediatric patients are seen more frequently when they are younger. Your provider will advise you on well child visit timing that is appropriate for your their age. - This is not a medicare wellness visit. Medicare wellness exams do not have an exam portion to the visit. Some medicare companies allow for a physical, some do not allow a yearly physical. If your medicare allows a yearly physical you can schedule the medicare wellness with our nurse Maudie Mercury and have your physical with your provider after, on the same day. Please check with insurance for your full benefits.   Late Policy/No Shows:  - all new  patients should arrive 15-30 minutes earlier than appointment to allow [place] time  to  obtain all personal demographics,  insurance information and for you to complete office paperwork. - All established patients should arrive 10-15 minutes earlier than appointment time to update all information and be checked in .  - In our best efforts to run on time, if you are late for your appointment you will be asked to either reschedule or if able, we will work you back into the schedule. There will be a wait time to work you back in the schedule,  depending on availability.  - If you are unable to make it to your appointment as scheduled, please call 24 hours ahead of time to allow [place] to fill the time slot with someone else who needs to be seen. If you do not cancel your appointment ahead of time, you may be charged a no show fee.

## 2019-02-10 ENCOUNTER — Encounter: Payer: Self-pay | Admitting: Family Medicine

## 2019-02-10 DIAGNOSIS — Z5320 Procedure and treatment not carried out because of patient's decision for unspecified reasons: Secondary | ICD-10-CM | POA: Insufficient documentation

## 2019-02-17 ENCOUNTER — Other Ambulatory Visit: Payer: BC Managed Care – PPO

## 2019-02-22 ENCOUNTER — Other Ambulatory Visit (INDEPENDENT_AMBULATORY_CARE_PROVIDER_SITE_OTHER): Payer: BC Managed Care – PPO

## 2019-02-22 DIAGNOSIS — E785 Hyperlipidemia, unspecified: Secondary | ICD-10-CM | POA: Diagnosis not present

## 2019-02-22 DIAGNOSIS — I10 Essential (primary) hypertension: Secondary | ICD-10-CM

## 2019-02-22 LAB — CBC WITH DIFFERENTIAL/PLATELET
Basophils Absolute: 0 10*3/uL (ref 0.0–0.1)
Basophils Relative: 0.4 % (ref 0.0–3.0)
Eosinophils Absolute: 0.3 10*3/uL (ref 0.0–0.7)
Eosinophils Relative: 2.2 % (ref 0.0–5.0)
HCT: 49 % — ABNORMAL HIGH (ref 36.0–46.0)
Hemoglobin: 16.5 g/dL — ABNORMAL HIGH (ref 12.0–15.0)
Lymphocytes Relative: 24.8 % (ref 12.0–46.0)
Lymphs Abs: 2.9 10*3/uL (ref 0.7–4.0)
MCHC: 33.7 g/dL (ref 30.0–36.0)
MCV: 93 fl (ref 78.0–100.0)
MONO ABS: 0.8 10*3/uL (ref 0.1–1.0)
Monocytes Relative: 7.1 % (ref 3.0–12.0)
Neutro Abs: 7.7 10*3/uL (ref 1.4–7.7)
Neutrophils Relative %: 65.5 % (ref 43.0–77.0)
Platelets: 259 10*3/uL (ref 150.0–400.0)
RBC: 5.27 Mil/uL — ABNORMAL HIGH (ref 3.87–5.11)
RDW: 12.6 % (ref 11.5–15.5)
WBC: 11.7 10*3/uL — AB (ref 4.0–10.5)

## 2019-02-22 LAB — COMPREHENSIVE METABOLIC PANEL
ALT: 32 U/L (ref 0–35)
AST: 30 U/L (ref 0–37)
Albumin: 4.5 g/dL (ref 3.5–5.2)
Alkaline Phosphatase: 67 U/L (ref 39–117)
BUN: 20 mg/dL (ref 6–23)
CO2: 27 mEq/L (ref 19–32)
Calcium: 10.1 mg/dL (ref 8.4–10.5)
Chloride: 104 mEq/L (ref 96–112)
Creatinine, Ser: 0.6 mg/dL (ref 0.40–1.20)
GFR: 104.48 mL/min (ref >60.00)
Glucose, Bld: 100 mg/dL — ABNORMAL HIGH (ref 70–99)
Potassium: 3.8 mEq/L (ref 3.5–5.1)
Sodium: 140 mEq/L (ref 135–145)
Total Bilirubin: 0.5 mg/dL (ref 0.2–1.2)
Total Protein: 7.3 g/dL (ref 6.0–8.3)

## 2019-02-22 LAB — LIPID PANEL
CHOLESTEROL: 171 mg/dL (ref 0–200)
HDL: 56.4 mg/dL (ref >39.00)
LDL Cholesterol: 89 mg/dL (ref 0–99)
NonHDL: 115
Total CHOL/HDL Ratio: 3
Triglycerides: 129 mg/dL (ref 0.0–149.0)
VLDL: 25.8 mg/dL (ref 0.0–40.0)

## 2019-02-23 ENCOUNTER — Telehealth: Payer: Self-pay | Admitting: Family Medicine

## 2019-02-23 NOTE — Telephone Encounter (Signed)
LM for patient to call for results. Okay for P[facility] to disclose results if patient calls back.

## 2019-02-23 NOTE — Telephone Encounter (Signed)
Please inform patient the following information: Her cholesterol , liver, kidneys, sugar and electrolytes look great.  Her white blood cells and ad hemoglobin are still higher than normal. This Is most likely from her smoking and has been present as far back as 2013 in medical records.

## 2019-02-23 NOTE — Telephone Encounter (Signed)
Patient called, left VM to return call to the office for lab results. 

## 2019-02-23 NOTE — Telephone Encounter (Signed)
Results reviewed with patient; verbalizes understanding. 

## 2019-04-13 ENCOUNTER — Telehealth: Payer: Self-pay | Admitting: Family Medicine

## 2019-04-13 NOTE — Telephone Encounter (Signed)
Patient has an infection in her gum. Her dentist is closed. Patient does not have a computer or cell phone to do a virtual visit. Please advise.

## 2019-04-14 NOTE — Telephone Encounter (Signed)
She should be encouraged to make a phone visit.

## 2019-04-14 NOTE — Telephone Encounter (Signed)
Called LM for patient to return call to schedule appt

## 2019-04-14 NOTE — Telephone Encounter (Signed)
Please advise 

## 2019-04-16 ENCOUNTER — Other Ambulatory Visit: Payer: Self-pay

## 2019-04-16 ENCOUNTER — Ambulatory Visit (INDEPENDENT_AMBULATORY_CARE_PROVIDER_SITE_OTHER): Payer: BC Managed Care – PPO | Admitting: Family Medicine

## 2019-04-16 ENCOUNTER — Encounter: Payer: Self-pay | Admitting: Family Medicine

## 2019-04-16 VITALS — Ht 62.0 in | Wt 154.0 lb

## 2019-04-16 DIAGNOSIS — K0889 Other specified disorders of teeth and supporting structures: Secondary | ICD-10-CM

## 2019-04-16 MED ORDER — AMOXICILLIN-POT CLAVULANATE 875-125 MG PO TABS: 1.0000 | ORAL_TABLET | Freq: Two times a day (BID) | ORAL | 0 refills | Status: DC

## 2019-04-16 MED ORDER — CHLORHEXIDINE GLUCONATE 0.12 % MT SOLN: 15.0000 mL | Freq: Two times a day (BID) | OROMUCOSAL | 1 refills | Status: DC

## 2019-04-16 NOTE — Telephone Encounter (Signed)
Pt scheduled appt

## 2019-04-16 NOTE — Progress Notes (Signed)
VIRTUAL VISIT VIA VIDEO-Fail  I connected with Teresa Calderon on 04/16/19 at  3:20 PM EDT by a video enabled telemedicine application and verified that I am speaking with the correct person using two identifiers. Location patient: Home Location provider: [facility], Office Persons participating in the virtual visit: Patient, Dr. Raoul Pitch and R.Baker, LPN  I discussed the limitations of evaluation and management by telemedicine and the availability of in person appointments. The patient expressed understanding and agreed to proceed.  Interactive audio and video telecommunications were attempted between this provider and patient, however failed, due to patient having technical difficulties OR patient did not have access to video capability. We continued and completed visit with audio only.    SUBJECTIVE Chief Complaint  Patient presents with  . Dental Pain    Left sided tooth pain x5 days. Pt reports swelling in the gums with soreness. Pt has contacted dentists and nobody can see her due to covid 19. Denies fever or chills     HPI:  Dental pain: She reports she is experiencing dental pain over the last 5 days.  The pain is located in the upper left molar.  She states her gums are mildly swollen and a little red. She denies fever.  She denies any drainage.  She is had a similar presentation in the past and was going to have that tooth pulled, however the infection improved  and the dentist never did end up pulling the tooth.  She called her dentist and they are not seeing any patient secondary to COVID-19.  She has been taking Tylenol and orajel for discomfort.   ROS: See pertinent positives and negatives per HPI.  Patient Active Problem List   Diagnosis Date Noted  . Statin declined 02/10/2019  . Ectatic abdominal aorta ([place]) 11/03/2018  . Controlled type 2 diabetes mellitus without complication, without long-term current use of insulin ([place]) 10/28/2018  . Leukocytosis 10/28/2018   . Elevated LFTs 10/28/2018  . Tobacco abuse 10/26/2018  . Witnessed apneic spells 04/11/2017  . Snoring 04/11/2017  . Daytime somnolence 04/11/2017  . Morbid obesity ([place]) 04/11/2017  . Hyperlipidemia LDL goal <130 02/21/2016  . Vitamin D deficiency 01/31/2015  . Essential hypertension, benign 03/08/2014  . Palpitations 01/20/2012  . Anxiety     Social History   Tobacco Use  . Smoking status: Current Every Day Smoker    Packs/day: 0.50    Years: 20.00    Pack years: 10.00    Types: Cigarettes  . Smokeless tobacco: Never Used  Substance Use Topics  . Alcohol use: No    Alcohol/week: 0.0 standard drinks    Current Outpatient Medications:  .  blood glucose meter kit and supplies KIT, Dispense based on patient and insurance preference. Use up to two times daily as directed. ., Disp: 1 each, Rfl: 0 .  cholecalciferol (VITAMIN D) 1000 units tablet, Take 1 tablet (1,000 Units total) by mouth daily. (Patient taking differently: Take 1,000 Units by mouth 2 (two) times a day. ), Disp: , Rfl:  .  fenofibrate (TRICOR) 145 MG tablet, Take 1 tablet (145 mg total) by mouth daily., Disp: 90 tablet, Rfl: 3 .  hydrochlorothiazide (HYDRODIURIL) 25 MG tablet, Take 1 tablet (25 mg total) by mouth daily., Disp: 90 tablet, Rfl: 1 .  metoprolol succinate (TOPROL-XL) 100 MG 24 hr tablet, TAKE 1 TABLET BY MOUTH ONCE DAILY WITH OR IMMEDIATELY FOLLOWING A MEAL, Disp: 90 tablet, Rfl: 1 .  metoprolol succinate (TOPROL-XL) 25 MG 24  hr tablet, Take 1 tablet (25 mg total) by mouth daily., Disp: 90 tablet, Rfl: 1 .  Multiple Vitamin (MULTIVITAMIN) tablet, Take 1 tablet by mouth daily., Disp: , Rfl:  .  Omega-3 Fatty Acids (FISH OIL) 1000 MG CAPS, Take 1 capsule by mouth daily., Disp: , Rfl:  .  senna-docusate (SENOKOT-S) 8.6-50 MG tablet, Take 1-2 tablets by mouth 2 (two) times daily., Disp: 120 tablet, Rfl: 5 .  SitaGLIPtin-MetFORMIN HCl [phone] MG TB24, 0.5 tab QD 14 days, then 1 tab QD, Disp: 90 tablet, Rfl:  3  No Known Allergies  OBJECTIVE: Ht '5\' 2"'  (1.575 m)   Wt 154 lb (69.9 kg)   LMP 04/17/2015   BMI 28.17 kg/m  Gen: No acute distress. Nontoxic in appearance.  Chest: Cough or shortness of breath not present Neuro:  Alert. Oriented x3  Psych: Normal affect, dress and demeanor. Normal speech. Normal thought content and judgment.  ASSESSMENT AND PLAN: Teresa Calderon is a 53 y.o. female present for  Pain, dental - likely abscess of her tooth. She has had a dental infection in the area in the past.  - peridex sol gargle BID. Prescribed - augmentin BID prescribed. - continue current pain regimen of tylenol and orajel - if worsening will need to present to ED or a dentist that will see her.  - F/U PRN   Howard Pouch, DO 04/16/2019

## 2019-04-26 ENCOUNTER — Ambulatory Visit: Payer: BC Managed Care – PPO | Admitting: Family Medicine

## 2019-04-27 IMAGING — US US ABDOMEN COMPLETE
1 series · 13 of 25 positions shown · non-contrast
Comparison: None.

CLINICAL DATA: Abdominal fullness with elevated LFTs

EXAM:
ABDOMEN ULTRASOUND COMPLETE

[Series 1: us abdomen complete · 0.17mm/px · 13 of 67 slices shown]
[im 1/67]
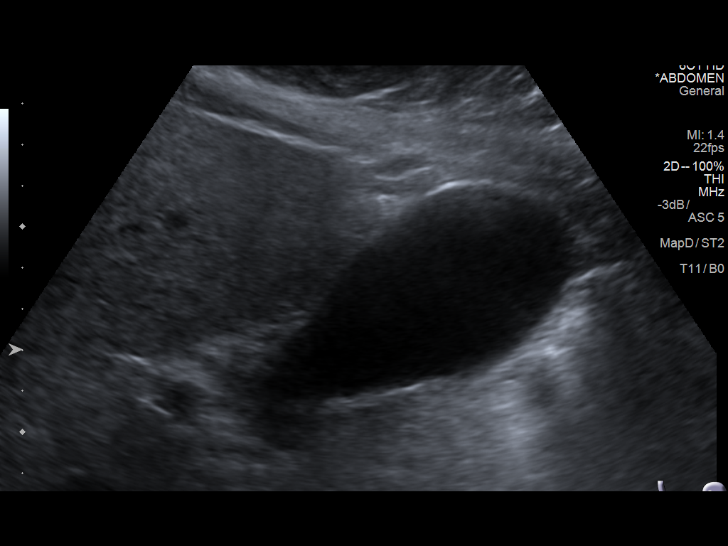
[im 6/67]
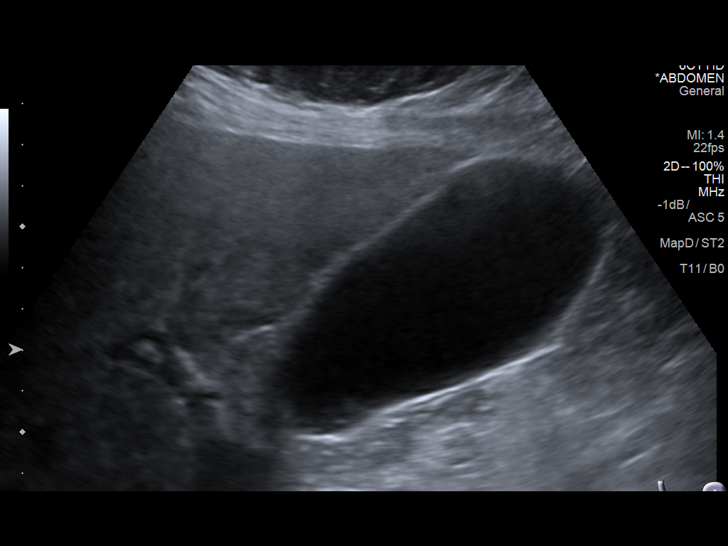
[im 12/67]
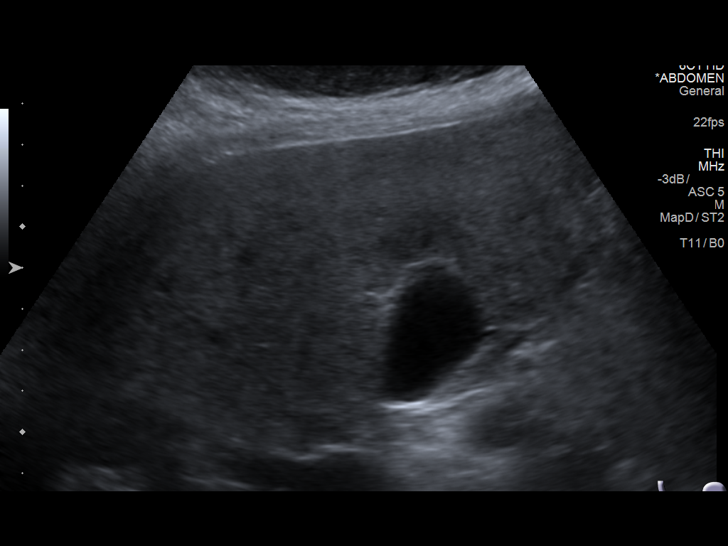
[im 17/67]
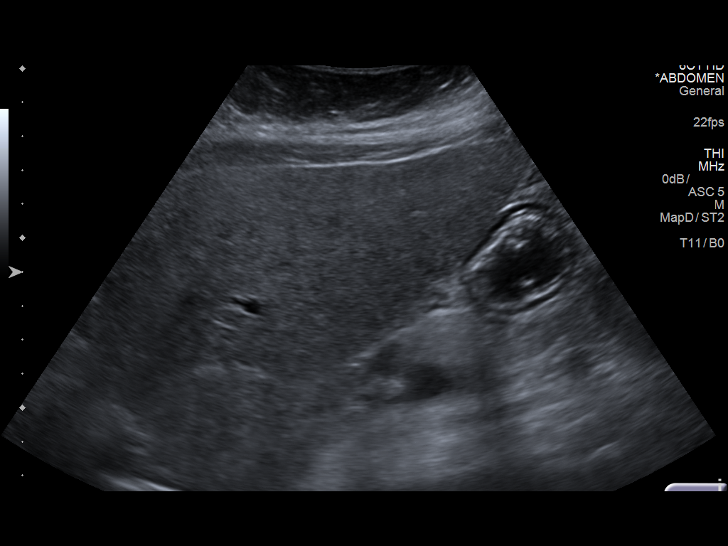
[im 23/67]
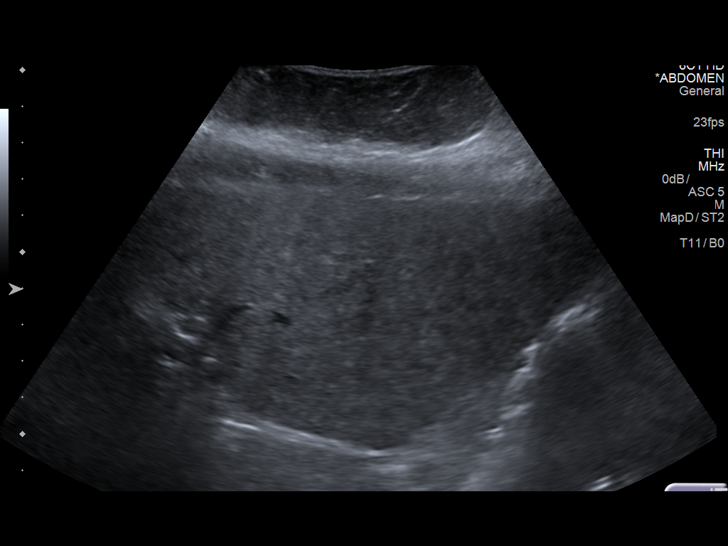
[im 28/67]
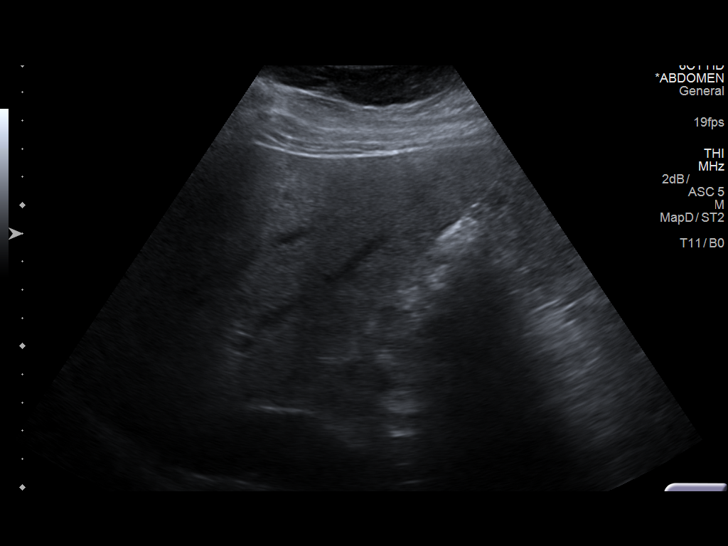
[im 34/67]
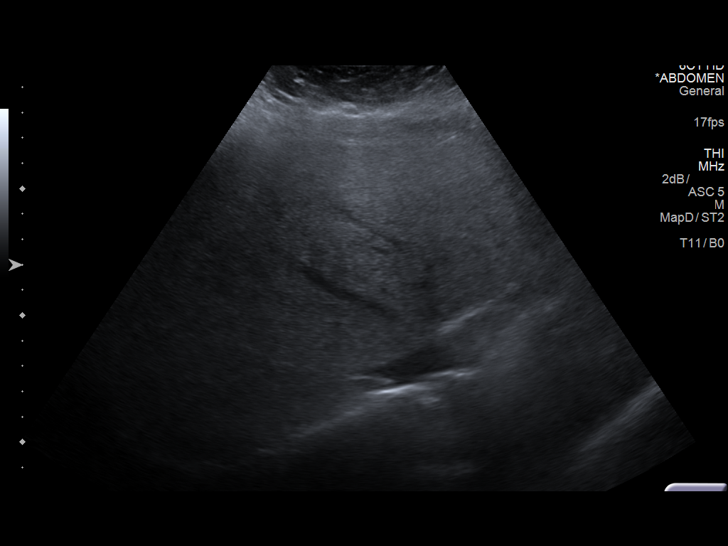
[im 39/67]
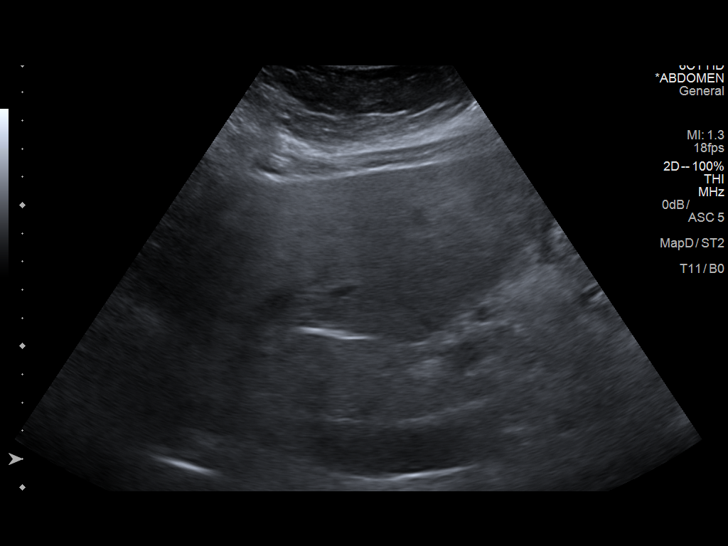
[im 45/67]
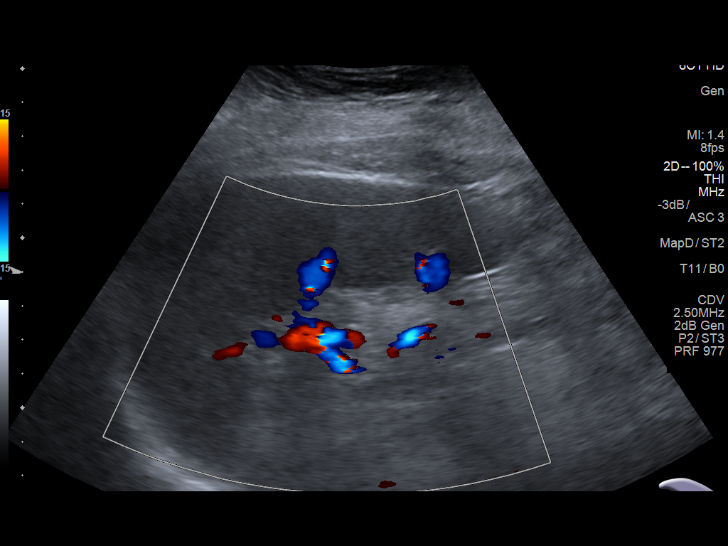
[im 50/67]
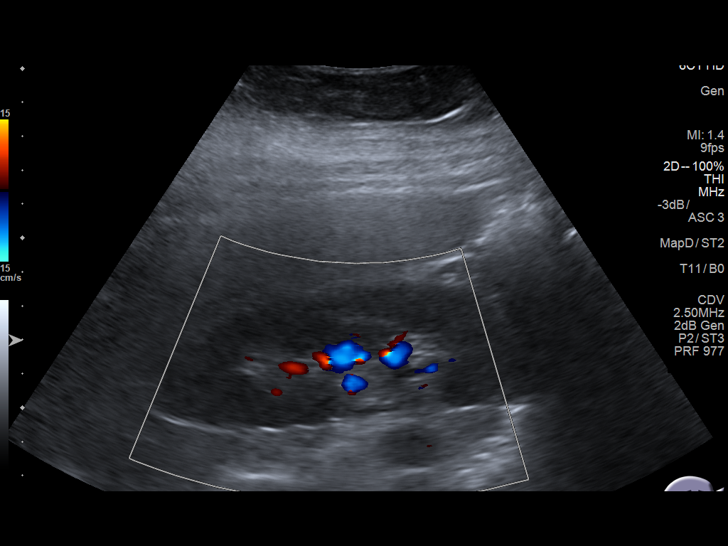
[im 56/67]
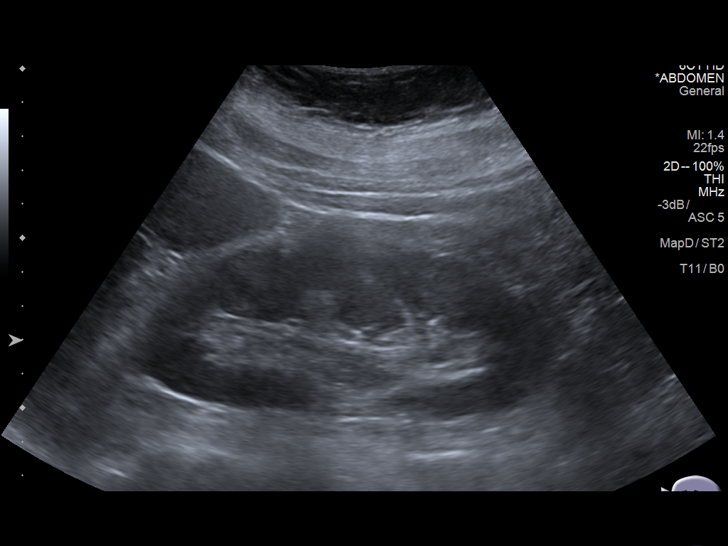
[im 61/67]
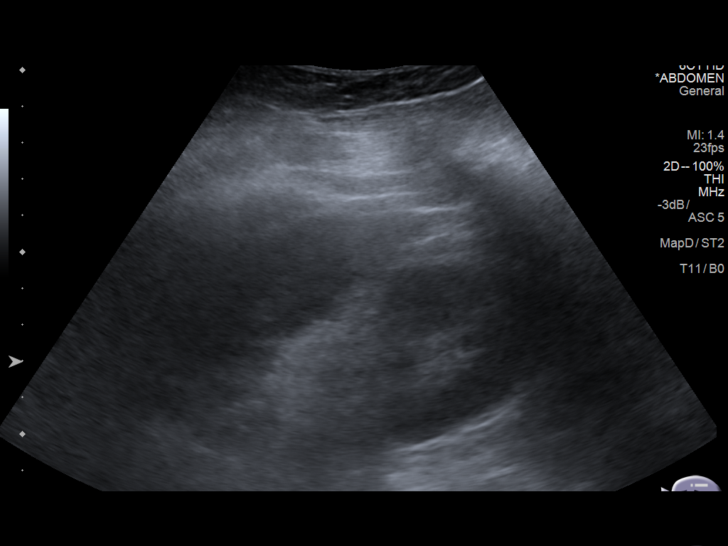
[im 67/67]
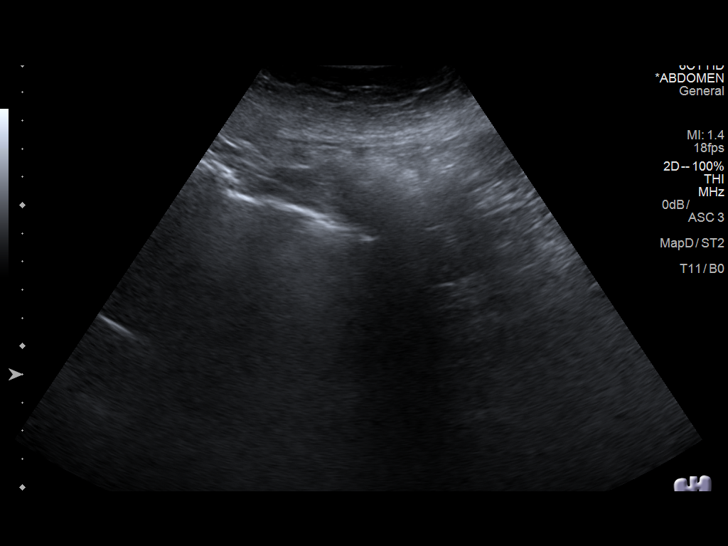

[13 of 25 positions shown; findings below may reference images not displayed]

FINDINGS: Gallbladder: No gallstones or wall thickening visualized. No
sonographic Murphy sign noted by sonographer.

Common bile duct: Diameter: 3.3 mm

Liver: Increased hepatic echogenicity. No focal hepatic abnormality.
Portal vein is patent on color Doppler imaging with normal direction
of blood flow towards the liver.

IVC: Limited visualization, visualized portions unremarkable.

Pancreas: Limited visualization, visualized portions unremarkable.

Spleen: Size and appearance within normal limits.

Right Kidney: Length: 11 cm. Echogenicity within normal limits. No
mass or hydronephrosis visualized.

Left Kidney: Length: 12.2 cm. Echogenicity within normal limits. No
mass or hydronephrosis visualized.

Abdominal aorta: No aneurysm visualized. Maximum diameter of 2.6 cm.

Other findings: None.
IMPRESSION: 1. Increased hepatic echogenicity suggesting steatosis and or
hepatocellular disease.
2. Negative for gallstones or biliary dilatation.
3. Maximum aortic diameter of 2.6 cm. Ectatic abdominal aorta at
risk for aneurysm development. Recommend followup by ultrasound in 5
years. This recommendation follows ACR consensus guidelines: White
Paper of the ACR Incidental Findings Committee II on Vascular
Findings. [HOSPITAL] 1553; [DATE].

## 2019-04-27 IMAGING — US US TRANSVAGINAL NON-OB
1 series · 14 of 25 positions shown · non-contrast
Comparison: None

CLINICAL DATA: Enlarged uterus, abdominal fullness

EXAM:
TRANSABDOMINAL AND TRANSVAGINAL ULTRASOUND OF PELVIS
TECHNIQUE: Both transabdominal and transvaginal ultrasound examinations of the
pelvis were performed. Transabdominal technique was performed for
global imaging of the pelvis including uterus, ovaries, adnexal
regions, and pelvic cul-de-sac. It was necessary to proceed with
endovaginal exam following the transabdominal exam to visualize the
uterus endometrium and ovaries.

[Series 1: us transvaginal non-ob · 0.25mm/px · 14 of 43 slices shown]
[im 1/43]
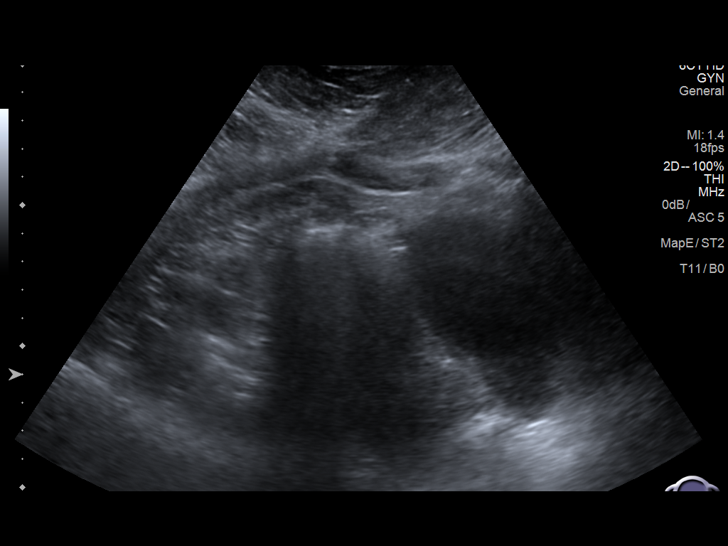
[im 4/43]
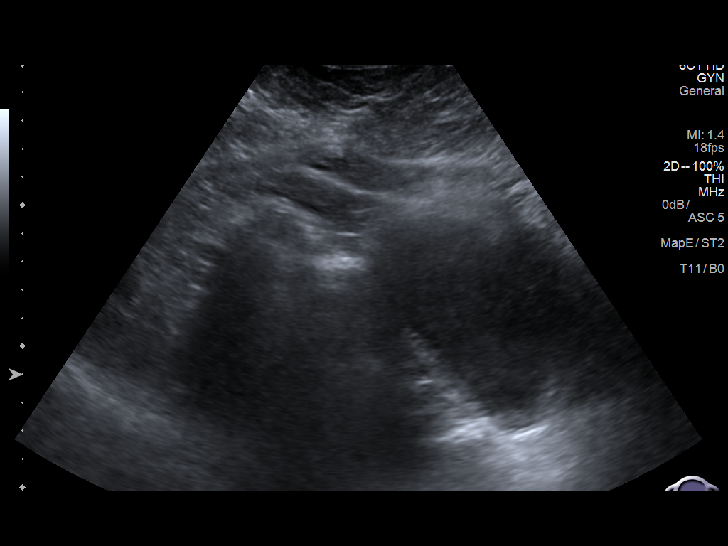
[im 8/43]
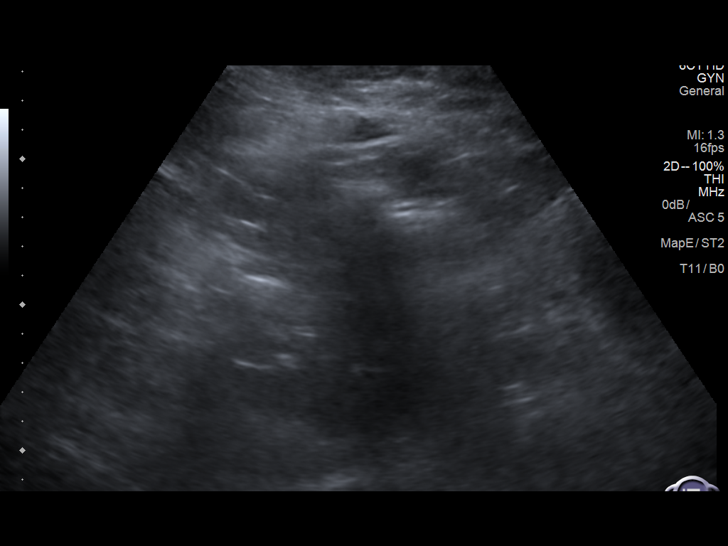
[im 11/43]
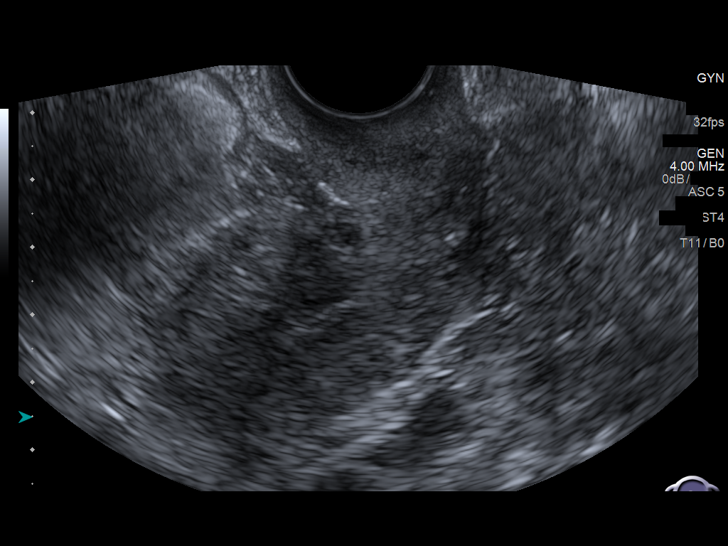
[im 15/43]
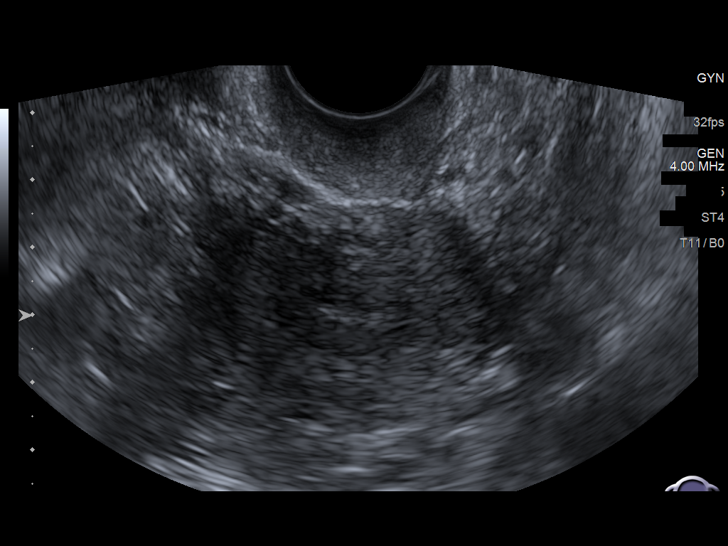
[im 16/43]
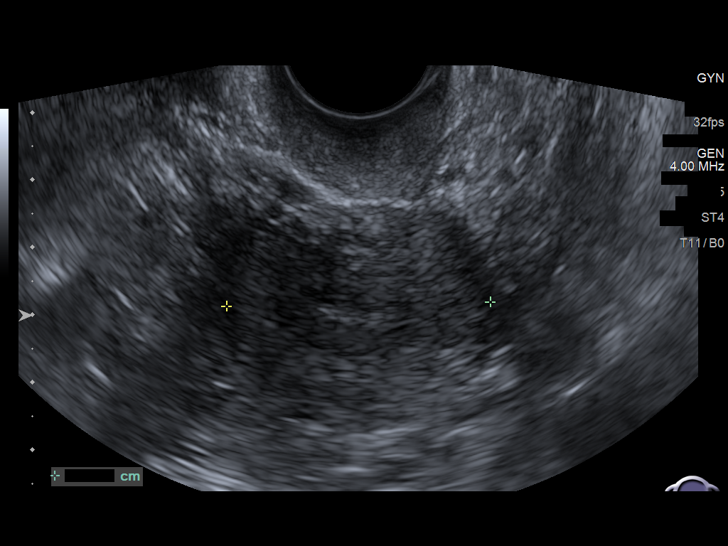
[im 20/43]
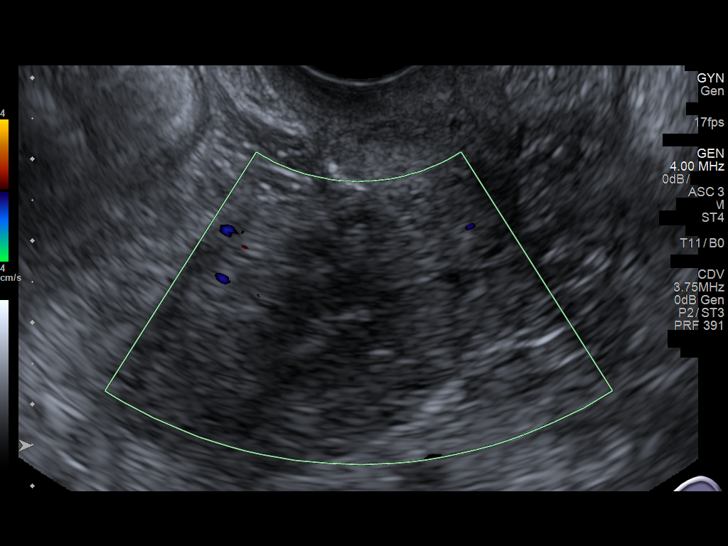
[im 23/43]
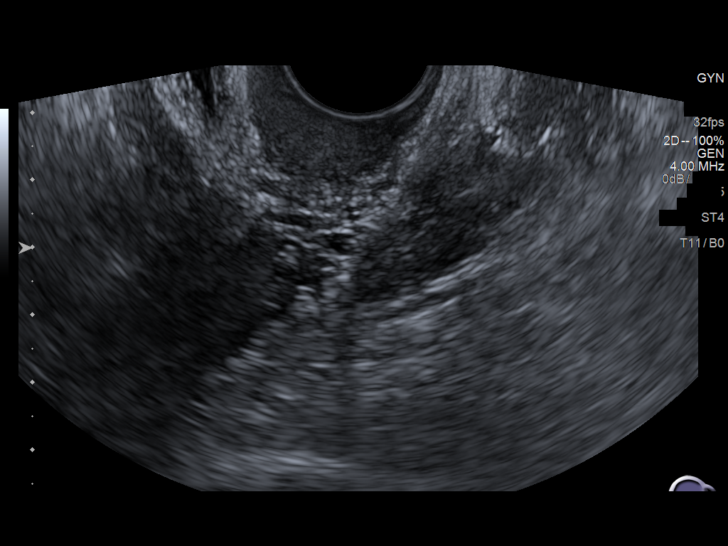
[im 27/43]
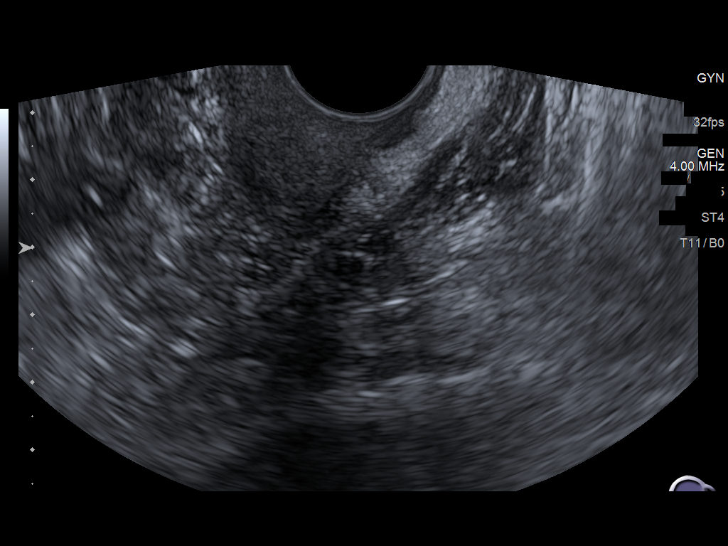
[im 29/43]
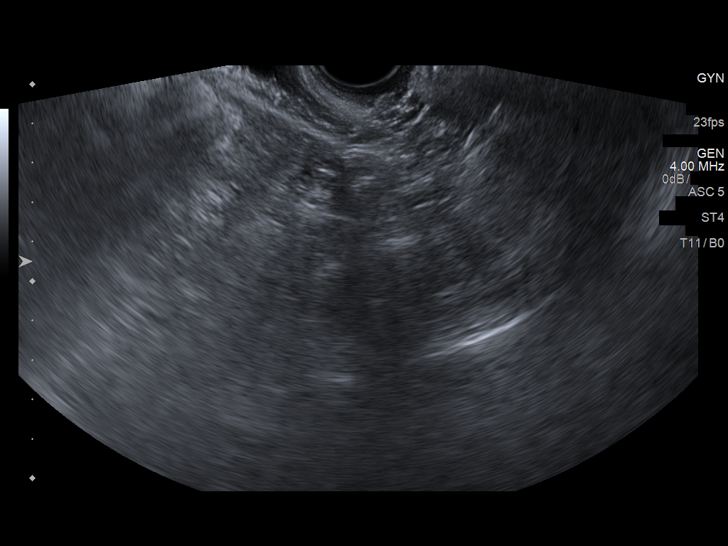
[im 32/43]
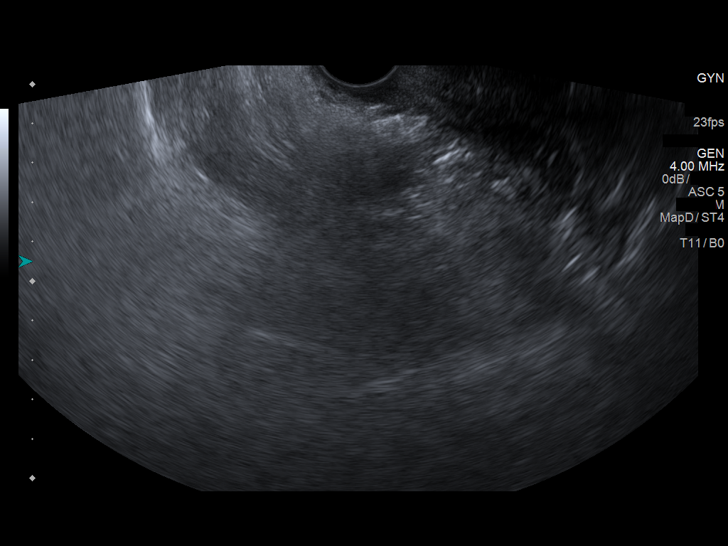
[im 36/43]
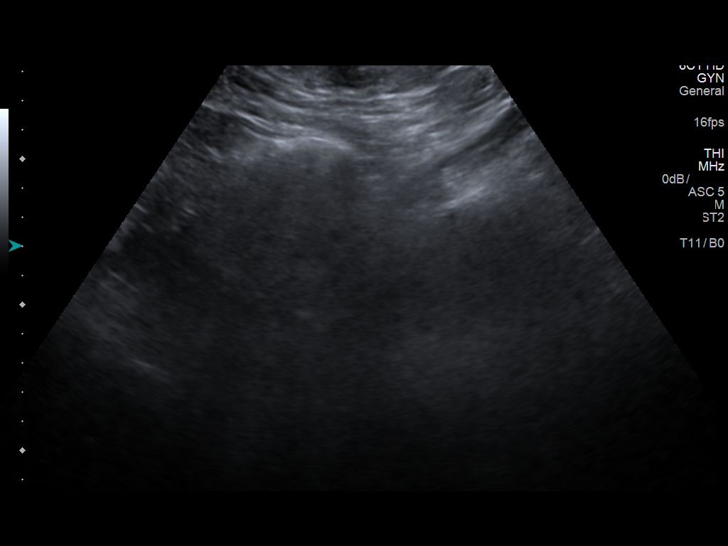
[im 39/43]
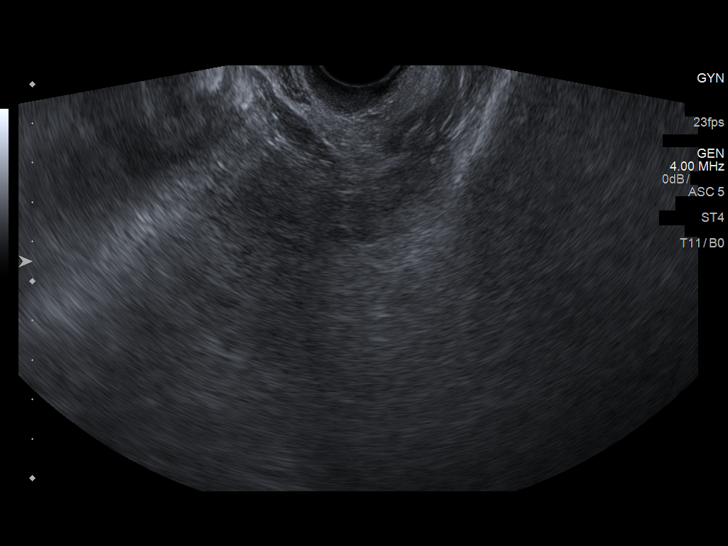
[im 43/43]
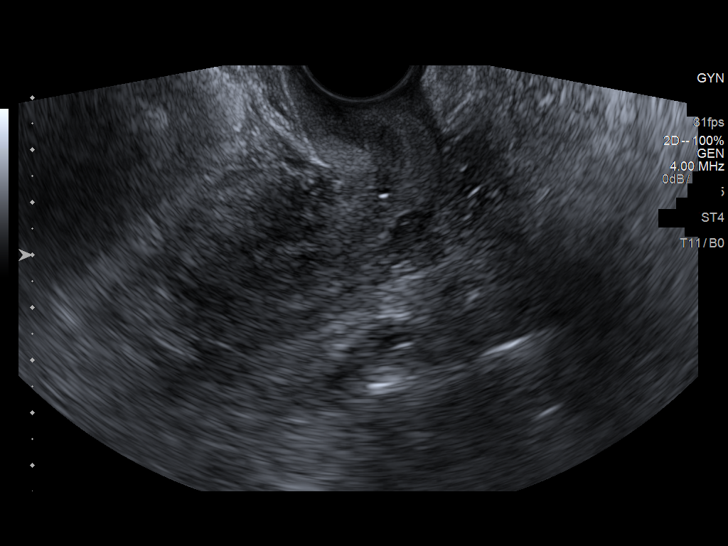

[14 of 25 positions shown; findings below may reference images not displayed]

FINDINGS: Uterus

Measurements: 5.7 cm length by 3.5 cm height by 3.9 cm wide =
volume: 40.7 mL. No fibroids or other mass visualized.

Endometrium

Thickness: 5 mm.  No focal abnormality visualized.

Right ovary

Nonvisualized due to bowel gas

Left ovary

Measurements: 2.2 x 1 x 1.6 cm = volume: 1.8 mL. Normal
appearance/no adnexal mass.

Other findings

No abnormal free fluid.
IMPRESSION: 1. Nonvisualized right ovary.
2. Otherwise negative pelvic ultrasound

## 2019-05-21 ENCOUNTER — Ambulatory Visit (INDEPENDENT_AMBULATORY_CARE_PROVIDER_SITE_OTHER): Payer: BC Managed Care – PPO | Admitting: Family Medicine

## 2019-05-21 ENCOUNTER — Encounter: Payer: Self-pay | Admitting: Family Medicine

## 2019-05-21 ENCOUNTER — Other Ambulatory Visit: Payer: Self-pay

## 2019-05-21 VITALS — Temp 97.5°F | Ht 62.0 in | Wt 151.0 lb

## 2019-05-21 DIAGNOSIS — E785 Hyperlipidemia, unspecified: Secondary | ICD-10-CM

## 2019-05-21 DIAGNOSIS — I10 Essential (primary) hypertension: Secondary | ICD-10-CM | POA: Diagnosis not present

## 2019-05-21 DIAGNOSIS — E119 Type 2 diabetes mellitus without complications: Secondary | ICD-10-CM

## 2019-05-21 DIAGNOSIS — R002 Palpitations: Secondary | ICD-10-CM

## 2019-05-21 DIAGNOSIS — I77811 Abdominal aortic ectasia: Secondary | ICD-10-CM | POA: Diagnosis not present

## 2019-05-21 DIAGNOSIS — Z23 Encounter for immunization: Secondary | ICD-10-CM

## 2019-05-21 DIAGNOSIS — Z5320 Procedure and treatment not carried out because of patient's decision for unspecified reasons: Secondary | ICD-10-CM

## 2019-05-21 MED ORDER — METOPROLOL SUCCINATE ER 100 MG PO TB24: ORAL_TABLET | ORAL | 1 refills | Status: DC

## 2019-05-21 MED ORDER — METOPROLOL SUCCINATE ER 25 MG PO TB24: 25.0000 mg | ORAL_TABLET | Freq: Every day | ORAL | 1 refills | Status: DC

## 2019-05-21 MED ORDER — HYDROCHLOROTHIAZIDE 25 MG PO TABS: 25.0000 mg | ORAL_TABLET | Freq: Every day | ORAL | 1 refills | Status: DC

## 2019-05-21 NOTE — Progress Notes (Signed)
VIRTUAL VISIT VIA - telephone  I connected with Byrd Hesselbach on 05/21/19 at  3:00 PM EDT by telemedicine application and verified that I am speaking with the correct person using two identifiers. Location patient: Home Location provider: [facility], Office Persons participating in the virtual visit: Patient, Dr. Raoul Pitch and R.Baker, LPN  I discussed the limitations of evaluation and management by telemedicine and the availability of in person appointments. The patient expressed understanding and agreed to proceed.   SUBJECTIVE Chief Complaint  Patient presents with  . Diabetes    No complaints. Does not need refills.   . Hypertension  . Hyperlipidemia    HPI:  type 2 diabetes mellitus ([place]) Diagnosed 10/26/2018 with a A1c 13.9.  Reports compliance with Janumet [phone] mg daily.  She reports glucose logs which range between normal. Patient denies dizziness, hyperglycemic or hypoglycemic events. Patient denies numbness, tingling in the extremities or nonhealing wounds of feet.  PNA series: agreeable to start PNA23 now- ordered Flu shot: UTD 10/2018 (recommneded yearly) BMP: 02/22/2019- WNL Foot exam: completed  11/03/2018 Eye exam: referred once- did not go. Referred again today and educated on Dm eye exam yearly. She will need to check with her insurance to see if they will cover DM eye exam.  A1c: 13.9 (10/26/2018)--> 5.4 >> ordered   Essential hypertension, benign/. Morbid obesity (HCC)/Ectatic abdominal aorta (HCC)/Elevated LFTs/Hepatic steatosis/Hyperlipidemia LDL goal <130/statin declined Patient reports compliance with metoprolol 125 mg daily and HCTZ 25 mg daily.  He is taking the fenofibrate and using the fish oil supplementation. Patient denies chest pain, shortness of breath, dizziness or lower extremity edema.  SHe does still have palpitations-without symptoms. Pt reportscompliancewith Metoprolol 125 mg daily and HCTZ 59m daily.  BMP:(see above) CBC:02/2019  leukocytosis, elevated hgb---heavy smoker. Stable. TSH:10/26/2018 WNL Lipids: triglycerides >400, 10/26/2018 RF: Hypertension, hyperlipidemia, Fhx coronary artery disease  ROS: See pertinent positives and negatives per HPI.  Patient Active Problem List   Diagnosis Date Noted  . Statin declined 02/10/2019  . Ectatic abdominal aorta ([place] 11/03/2018  . Controlled type 2 diabetes mellitus without complication, without long-term current use of insulin ([place] 10/28/2018  . Leukocytosis 10/28/2018  . Elevated LFTs 10/28/2018  . Tobacco abuse 10/26/2018  . Witnessed apneic spells 04/11/2017  . Snoring 04/11/2017  . Daytime somnolence 04/11/2017  . Morbid obesity ([place] 04/11/2017  . Hyperlipidemia LDL goal <130 02/21/2016  . Vitamin D deficiency 01/31/2015  . Essential hypertension, benign 03/08/2014  . Palpitations 01/20/2012  . Anxiety     Social History   Tobacco Use  . Smoking status: Current Every Day Smoker    Packs/day: 0.50    Years: 20.00    Pack years: 10.00    Types: Cigarettes  . Smokeless tobacco: Never Used  Substance Use Topics  . Alcohol use: No    Alcohol/week: 0.0 standard drinks    Current Outpatient Medications:  .  blood glucose meter kit and supplies KIT, Dispense based on patient and insurance preference. Use up to two times daily as directed. ., Disp: 1 each, Rfl: 0 .  chlorhexidine (PERIDEX) 0.12 % solution, Use as directed 15 mLs in the mouth or throat 2 (two) times daily. Gargle 15 seconds then spit out., Disp: 120 mL, Rfl: 1 .  cholecalciferol (VITAMIN D) 1000 units tablet, Take 1 tablet (1,000 Units total) by mouth daily. (Patient taking differently: Take 1,000 Units by mouth 2 (two) times a day. ), Disp: , Rfl:  .  fenofibrate (TRICOR) 145 MG  tablet, Take 1 tablet (145 mg total) by mouth daily., Disp: 90 tablet, Rfl: 3 .  hydrochlorothiazide (HYDRODIURIL) 25 MG tablet, Take 1 tablet (25 mg total) by mouth daily., Disp: 90 tablet, Rfl: 1 .  metoprolol  succinate (TOPROL-XL) 100 MG 24 hr tablet, TAKE 1 TABLET BY MOUTH ONCE DAILY WITH OR IMMEDIATELY FOLLOWING A MEAL, Disp: 90 tablet, Rfl: 1 .  metoprolol succinate (TOPROL-XL) 25 MG 24 hr tablet, Take 1 tablet (25 mg total) by mouth daily., Disp: 90 tablet, Rfl: 1 .  Multiple Vitamin (MULTIVITAMIN) tablet, Take 1 tablet by mouth daily., Disp: , Rfl:  .  Omega-3 Fatty Acids (FISH OIL) 1000 MG CAPS, Take 1 capsule by mouth daily., Disp: , Rfl:  .  senna-docusate (SENOKOT-S) 8.6-50 MG tablet, Take 1-2 tablets by mouth 2 (two) times daily., Disp: 120 tablet, Rfl: 5 .  SitaGLIPtin-MetFORMIN HCl [id number] MG TB24, 0.5 tab QD 14 days, then 1 tab QD (Patient taking differently: Take 1 tablet by mouth daily. ), Disp: 90 tablet, Rfl: 3 .  amoxicillin-clavulanate (AUGMENTIN) 875-125 MG tablet, Take 1 tablet by mouth 2 (two) times daily. (Patient not taking: Reported on 05/21/2019), Disp: 20 tablet, Rfl: 0  No Known Allergies  OBJECTIVE: Temp (!) 97.5 F (36.4 C) (Oral)   Ht _0  (1.575 m)   Wt 151 lb (68.5 kg)   LMP 04/17/2015   BMI 27.62 kg/m  Gen: No acute distress. Nontoxic CV: no edema reported Chest: Cough or shortness of breath not present Skin: no rashes, purpura or petechiae.  Neuro:  Alert. Oriented x3  Psych: Normal affect, dress and demeanor. Normal speech. Normal thought content and judgment.  ASSESSMENT AND PLAN: Delani Kohli is a 53 y.o. female present for  type 2 diabetes mellitus ([place]) She did an amazing job taking her medication daily, monitoring her glucose levels, attempting to exercise and watch her diet.  - lab appt next week  To A1c - If A1c below 5.5  will consider decreasing medication dose.  Will monitor for any signs of hypoglycemia. PNA series: agreeable to start PNA23 - ordered to receive via nurse visit with labs Flu shot: UTD 10/2018 (recommneded yearly) BMP: 02/22/2019- WNL Foot exam: completed  11/03/2018 Eye exam: referred once- did not go. Referred again today  and educated on Dm eye exam yearly. She will need to check with her insurance to see if they will cover DM eye exam.  A1c: 13.9 (10/26/2018)--> 5.4 >> ordered -Continue to monitor fasting and random testing/record/bring to next appt.  - education and guidance on diet and exercise recommendations.  F/u 3 mos  Essential hypertension, benign/. Morbid obesity (HCC)/Ectatic abdominal aorta (HCC)/Elevated LFTs/Hepatic steatosis/Hyperlipidemia LDL goal <130 VS check scheduled with lab appt.  - For now  continue  Metoprolol 125 mg daily and  HCTZ to  42m daily 2/2 low potassium.  - continue  fenofibrate.  Declines statin.   - consider adding ACe or ARB in the future if able--> if elevated or borderline next visit consider adding low-dose lisinopril/HCTZ combo ABD [place]: Maximum aortic diameter of 2.6 cm. Ectatic abdominal aorta at risk for aneurysm development. Recommend followup by ultrasound in 5 years. DUE 10/2023--> discussed in detail.  - f/u 3 mos    > 25 minutes spent with patient, >50% of time spent face to face counseling    RHoward Pouch DO 05/21/2019

## 2019-05-24 ENCOUNTER — Encounter: Payer: Self-pay | Admitting: Family Medicine

## 2019-05-26 ENCOUNTER — Ambulatory Visit: Payer: BC Managed Care – PPO

## 2019-05-28 ENCOUNTER — Other Ambulatory Visit: Payer: Self-pay

## 2019-05-28 ENCOUNTER — Ambulatory Visit (INDEPENDENT_AMBULATORY_CARE_PROVIDER_SITE_OTHER): Payer: BC Managed Care – PPO

## 2019-05-28 VITALS — BP 122/74 | HR 76

## 2019-05-28 DIAGNOSIS — Z23 Encounter for immunization: Secondary | ICD-10-CM

## 2019-05-28 DIAGNOSIS — E119 Type 2 diabetes mellitus without complications: Secondary | ICD-10-CM

## 2019-05-28 DIAGNOSIS — I10 Essential (primary) hypertension: Secondary | ICD-10-CM

## 2019-05-28 NOTE — Progress Notes (Addendum)
Teresa Calderon is a 53 y.o. female presents to the office today for Blood pressure recheck secondary to Essential Hypertension/Virtual Visit on 05/21/2019.  Blood pressure medication: Metoprolol 125 mg daily and HCTZ 25mg  daily.  If on medication, Last dose was at least 1-2 hours prior to recheck: Yes (Last dose 05/27/2019 @ 7pm).  Blood pressure was taken in the left arm after patient rested for 10 minutes.  BP 122/74 (BP Location: Left Arm, Patient Position: Sitting, Cuff Size: Normal)   Pulse 76   LMP 04/17/2015   SpO2 99%   [name] [name]  ___________________________________________________  BP looks great. Continue current regimen.   Electronically Signed by: Felix Pacini, DO [facility]- OR

## 2019-05-28 NOTE — Addendum Note (Signed)
Addended by: Eulah Pont on: 05/28/2019 01:05 PM   Modules accepted: Orders

## 2019-05-29 LAB — HEMOGLOBIN A1C
Hgb A1c MFr Bld: 5 % of total Hgb (ref <5.7)
Mean Plasma Glucose: 97 (calc)
eAG (mmol/L): 5.4 (calc)

## 2019-05-31 ENCOUNTER — Telehealth: Payer: Self-pay | Admitting: Family Medicine

## 2019-05-31 MED ORDER — SITAGLIP PHOS-METFORMIN HCL ER 50-1000 MG PO TB24: 1.0000 | ORAL_TABLET | Freq: Every day | ORAL | 1 refills | Status: DC

## 2019-05-31 NOTE — Progress Notes (Signed)
Left detailed message of great BP and to continue current regimen.

## 2019-05-31 NOTE — Telephone Encounter (Signed)
Please inform patient the following information: Her a1c is 5.0!!! This is wonderful! I have called in a LOWER dose of the medication she is currently on. She can finish what she has by taking 1/2 tab daily. New bottle should be Lower dose 50/1000  Great job- follow up in 4 mos.

## 2019-05-31 NOTE — Telephone Encounter (Signed)
Called and left detailed message with results and new dose of medication, on answering machine, okay per DPR.

## 2019-09-24 ENCOUNTER — Telehealth: Payer: Self-pay

## 2019-09-24 ENCOUNTER — Ambulatory Visit: Payer: BC Managed Care – PPO | Admitting: Family Medicine

## 2019-09-24 ENCOUNTER — Encounter: Payer: Self-pay | Admitting: Family Medicine

## 2019-09-24 ENCOUNTER — Other Ambulatory Visit: Payer: Self-pay

## 2019-09-24 VITALS — BP 116/76 | HR 69 | Temp 98.2°F | Resp 16 | Wt 156.2 lb

## 2019-09-24 DIAGNOSIS — E119 Type 2 diabetes mellitus without complications: Secondary | ICD-10-CM

## 2019-09-24 DIAGNOSIS — Z23 Encounter for immunization: Secondary | ICD-10-CM | POA: Diagnosis not present

## 2019-09-24 DIAGNOSIS — I10 Essential (primary) hypertension: Secondary | ICD-10-CM

## 2019-09-24 DIAGNOSIS — Z1211 Encounter for screening for malignant neoplasm of colon: Secondary | ICD-10-CM

## 2019-09-24 DIAGNOSIS — Z5320 Procedure and treatment not carried out because of patient's decision for unspecified reasons: Secondary | ICD-10-CM

## 2019-09-24 DIAGNOSIS — Z1231 Encounter for screening mammogram for malignant neoplasm of breast: Secondary | ICD-10-CM

## 2019-09-24 DIAGNOSIS — R002 Palpitations: Secondary | ICD-10-CM

## 2019-09-24 DIAGNOSIS — E785 Hyperlipidemia, unspecified: Secondary | ICD-10-CM

## 2019-09-24 LAB — POCT GLYCOSYLATED HEMOGLOBIN (HGB A1C)
HbA1c POC (<> result, manual entry): 5.3 % (ref 4.0–5.6)
HbA1c, POC (controlled diabetic range): 5.3 % (ref 0.0–7.0)
HbA1c, POC (prediabetic range): 5.3 % — AB (ref 5.7–6.4)
Hemoglobin A1C: 5.3 % (ref 4.0–5.6)

## 2019-09-24 MED ORDER — METFORMIN HCL 1000 MG PO TABS: 1000.0000 mg | ORAL_TABLET | Freq: Two times a day (BID) | ORAL | 1 refills | Status: DC

## 2019-09-24 MED ORDER — METOPROLOL SUCCINATE ER 100 MG PO TB24: ORAL_TABLET | ORAL | 1 refills | Status: DC

## 2019-09-24 MED ORDER — FENOFIBRATE 145 MG PO TABS: 145.0000 mg | ORAL_TABLET | Freq: Every day | ORAL | 3 refills | Status: DC

## 2019-09-24 MED ORDER — METOPROLOL SUCCINATE ER 25 MG PO TB24: 25.0000 mg | ORAL_TABLET | Freq: Every day | ORAL | 1 refills | Status: DC

## 2019-09-24 MED ORDER — HYDROCHLOROTHIAZIDE 25 MG PO TABS: 25.0000 mg | ORAL_TABLET | Freq: Every day | ORAL | 1 refills | Status: DC

## 2019-09-24 MED ORDER — SITAGLIPTIN PHOSPHATE 25 MG PO TABS: 25.0000 mg | ORAL_TABLET | Freq: Every day | ORAL | 1 refills | Status: DC

## 2019-09-24 NOTE — Addendum Note (Signed)
Addended by: Howard Pouch A on: 09/24/2019 06:25 PM   Modules accepted: Orders

## 2019-09-24 NOTE — Patient Instructions (Signed)
Your a1c looks great! 5.3  Check to see if your medication has a line down the center. If it dose we will go to 1/2 tab daily. If it does not we will call in separate januvia (lower dose)  and metformin.   Follow up 5 mos.

## 2019-09-24 NOTE — Progress Notes (Signed)
SUBJECTIVE Chief Complaint  Patient presents with  . Diabetes    checks sugar at home but not every day  . Hypertension    doesnt check bp at home. has not taken medications today    HPI:  type 2 diabetes mellitus ([place]) Diagnosed 10/26/2018 with a A1c 13.9.  Reports compliance with Janumet 50-1000 mg daily.  She reports glucose logs which are normal.  Patient denies dizziness, hyperglycemic or hypoglycemic events. Patient denies numbness, tingling in the extremities or nonhealing wounds of feet.  PNA series:  PNA23 05/2019; prevnar due 05/2020 Flu shot: UTD today (recommneded yearly) BMP: 02/22/2019- WNL Foot exam: completed  11/03/2018 Eye exam: referred once- did not go. Referred -educated on Dm eye exam yearly. She will need to check with her insurance to see if they will cover DM eye exam.  A1c: 13.9 (10/26/2018)--> 5.4 >> 5.3 today!   Essential hypertension, benign/. Morbid obesity (HCC)/Ectatic abdominal aorta (HCC)/Elevated LFTs/Hepatic steatosis/Hyperlipidemia LDL goal <130/statin declined Patient reports compliance with metoprolol 125 mg daily and HCTZ 25 mg daily.  SHe is taking the fenofibrate and using the fish oil supplementation. Patient denies chest pain, shortness of breath, dizziness or lower extremity edema.  She does still have palpitations-without symptoms. Pt reportscompliancewith Metoprolol 125 mg daily and HCTZ 65m daily.  BMP:(see above) CBC:02/2019 leukocytosis, elevated hgb---heavy smoker. Stable. TSH:10/26/2018 WNL Lipids: 02/2019 >> great RF: Hypertension, hyperlipidemia, Fhx coronary artery disease  ROS: See pertinent positives and negatives per HPI.  Patient Active Problem List   Diagnosis Date Noted  . Statin declined 02/10/2019  . Ectatic abdominal aorta ([place] 11/03/2018  . Controlled type 2 diabetes mellitus without complication, without long-term current use of insulin ([place] 10/28/2018  . Leukocytosis 10/28/2018  . Elevated LFTs 10/28/2018   . Tobacco abuse 10/26/2018  . Witnessed apneic spells 04/11/2017  . Snoring 04/11/2017  . Daytime somnolence 04/11/2017  . Morbid obesity ([place] 04/11/2017  . Hyperlipidemia LDL goal <130 02/21/2016  . Vitamin D deficiency 01/31/2015  . Essential hypertension, benign 03/08/2014  . Palpitations 01/20/2012  . Anxiety     Social History   Tobacco Use  . Smoking status: Current Every Day Smoker    Packs/day: 0.50    Years: 20.00    Pack years: 10.00    Types: Cigarettes  . Smokeless tobacco: Never Used  Substance Use Topics  . Alcohol use: No    Alcohol/week: 0.0 standard drinks    Current Outpatient Medications:  .  blood glucose meter kit and supplies KIT, Dispense based on patient and insurance preference. Use up to two times daily as directed. ., Disp: 1 each, Rfl: 0 .  cholecalciferol (VITAMIN D) 1000 units tablet, Take 1 tablet (1,000 Units total) by mouth daily. (Patient taking differently: Take 1,000 Units by mouth 2 (two) times a day. ), Disp: , Rfl:  .  fenofibrate (TRICOR) 145 MG tablet, Take 1 tablet (145 mg total) by mouth daily., Disp: 90 tablet, Rfl: 3 .  hydrochlorothiazide (HYDRODIURIL) 25 MG tablet, Take 1 tablet (25 mg total) by mouth daily., Disp: 90 tablet, Rfl: 1 .  metoprolol succinate (TOPROL-XL) 100 MG 24 hr tablet, TAKE 1 TABLET BY MOUTH ONCE DAILY WITH OR IMMEDIATELY FOLLOWING A MEAL, Disp: 90 tablet, Rfl: 1 .  metoprolol succinate (TOPROL-XL) 25 MG 24 hr tablet, Take 1 tablet (25 mg total) by mouth daily., Disp: 90 tablet, Rfl: 1 .  Multiple Vitamin (MULTIVITAMIN) tablet, Take 1 tablet by mouth daily., Disp: , Rfl:  .  Omega-3 Fatty Acids (FISH OIL) 1000 MG CAPS, Take 1 capsule by mouth daily., Disp: , Rfl:  .  senna-docusate (SENOKOT-S) 8.6-50 MG tablet, Take 1-2 tablets by mouth 2 (two) times daily., Disp: 120 tablet, Rfl: 5 .  SitaGLIPtin-MetFORMIN HCl 50-1000 MG TB24, Take 1 tablet by mouth daily., Disp: 90 tablet, Rfl: 1  No Known Allergies   OBJECTIVE: BP 116/76 (BP Location: Left Arm, Patient Position: Sitting, Cuff Size: Large)   Pulse 69   Temp 98.2 F (36.8 C) (Temporal)   Resp 16   Wt 156 lb 4 oz (70.9 kg)   LMP 04/17/2015   SpO2 98%   BMI 28.58 kg/m  Gen: Afebrile. No acute distress.  HENT: AT. [place].  MMM.  Eyes:Pupils Equal Round Reactive to light, Extraocular movements intact,  Conjunctiva without redness, discharge or icterus. Neck/lymp/endocrine: Supple,no lymphadenopathy, no thyromegaly CV: RRR no murmur, no edema, +2/4 P posterior tibialis pulses Chest: CTAB, no wheeze or crackles Abd: Soft. NTND. BS present. no Masses palpated.  Skin: no rashes, purpura or petechiae.  Neuro:  Normal gait. PERLA. EOMi. Alert. Oriented x3  Psych: Normal affect, dress and demeanor. Normal speech. Normal thought content and judgment.   Results for orders placed or performed in visit on 09/24/19 (from the past 24 hour(s))  POCT glycosylated hemoglobin (Hb A1C)     Status: Abnormal   Collection Time: 09/24/19  2:40 PM  Result Value Ref Range   Hemoglobin A1C 5.3 4.0 - 5.6 %   HbA1c POC (<> result, manual entry) 5.3 4.0 - 5.6 %   HbA1c, POC (prediabetic range) 5.3 (A) 5.7 - 6.4 %   HbA1c, POC (controlled diabetic range) 5.3 0.0 - 7.0 %     ASSESSMENT AND PLAN: Teresa Calderon is a 53 y.o. female present for  type 2 diabetes mellitus ([place]) She did an amazing job taking her medication daily, monitoring her glucose levels, attempting to exercise and watch her diet.  - a1c 5.3 today.  - discussed decreasing janumet dose- if pill is scored she will take 1/2 tab QD- if not scored we will need to separate pill to januvia 25 mg QD and metformin 1000 mg. She will call us on Monday and let us know.  PNA series: PNA23 completed 05/2019 Flu shot: UTD today (recommneded yearly) Foot exam: completed  11/03/2018 Eye exam: referred once- did not go. Referred again today and educated on Dm eye exam yearly. She will need to check with her  insurance to see if they will cover DM eye exam.  Urine microalbumin collected today A1c: 13.9 (10/26/2018)--> 5.4 >> 5.3 today - education and guidance on diet and exercise recommendations.  F/u 4-5 mos  Essential hypertension, benign/. Morbid obesity (HCC)/Ectatic abdominal aorta (HCC)/Elevated LFTs/Hepatic steatosis/Hyperlipidemia LDL goal <130 - stable.  - continue  Metoprolol 125 mg daily and  HCTZ to  42m daily 2/2 low potassium.  - continue  fenofibrate.  Declines statin.   ABD [place]: Maximum aortic diameter of 2.6 cm. Ectatic abdominal aorta at risk for aneurysm development. Recommend followup by ultrasound in 5 years. DUE 10/2023 - f/u 3 mos  Need for influenza vaccination - Flu Vaccine QUAD 36+ mos IM  Encounter for screening mammogram for malignant neoplasm of breast Pt agreeable to mammogram - MM 3D SCREEN BREAST BILATERAL; Future Colon cancer screening - pt agreeable to testing.  - Cologuard  > 25 minutes spent with patient, >50% of time spent face to face counseling   Orders Placed This Encounter  Procedures  . MM 3D SCREEN BREAST BILATERAL  . Flu Vaccine QUAD 36+ mos IM  . Urine Microalbumin w/creat. ratio  . Cologuard  . POCT glycosylated hemoglobin (Hb A1C)    Sharon, DO 09/24/2019

## 2019-09-24 NOTE — Telephone Encounter (Signed)
Patient called back and stated that her Janument was not scored. She said that you wanted to know if it was.

## 2019-09-24 NOTE — Telephone Encounter (Signed)
janumet 25 mg tab (Qd) and metformin 1000 mg tab (now BID) ordered. They will be separate now- but hopefully if A1c stays low next appt we will be able to [place] all together.  She can finish the combo pill she currently has- when bottle is finished start the two separate medications in place of combo pill.

## 2019-09-25 LAB — MICROALBUMIN / CREATININE URINE RATIO
Creatinine, Urine: 112 mg/dL (ref 20–275)
Microalb Creat Ratio: 18 mcg/mg creat (ref <30)
Microalb, Ur: 2 mg/dL

## 2019-09-27 ENCOUNTER — Telehealth: Payer: Self-pay | Admitting: Family Medicine

## 2019-09-27 NOTE — Telephone Encounter (Signed)
Called patient and asked for a call back to discuss changes in medications

## 2019-09-27 NOTE — Telephone Encounter (Signed)
Returning Teresa Calderon's call regarding meds

## 2019-09-27 NOTE — Telephone Encounter (Signed)
ERROR

## 2019-09-27 NOTE — Telephone Encounter (Signed)
Pt was called and given information. She verbalized understanding.  

## 2019-10-04 ENCOUNTER — Telehealth: Payer: Self-pay | Admitting: Family Medicine

## 2019-10-04 NOTE — Telephone Encounter (Signed)
Patient would like to speak with a nurse about her metformin and Januvia combination medication. When she picked up her refill they split up medication and her metformin is now a higher dosage. Patient is concerned about taking such a high dose. Please call after 3:15 today.

## 2019-10-04 NOTE — Telephone Encounter (Signed)
Called patient back and she stated that she is not taking two of the 1,000mg  of metformin. She does not want to bottom out and she thinks it is too much. She said that since Friday she has been fine. Her sugar is reading in the 80's. The highest its read since Friday is 104. Patient wants to know why she needs to take 1,000mg  BID. Please advise, thank you

## 2019-10-05 NOTE — Telephone Encounter (Signed)
Pt was called and given information, she verbalized understanding  

## 2019-10-05 NOTE — Telephone Encounter (Signed)
1. The metformin is a higher dose now and she should take it as prescribed. The combo it was long acting, this is not, so the dose needed to be changes to accommodate the difference.  2. Metformin does not make sugars "drop" it does not work that way.  3. The januvia dose is lower. The idea is if we can keep her sugars down, since she is losing weight, the next appt we may be able to stop [place]  all together. But she has to be taking the correct dose of metformin in order for [place] to do that.

## 2019-10-13 LAB — COLOGUARD: Cologuard: NEGATIVE

## 2020-03-29 ENCOUNTER — Ambulatory Visit: Payer: BC Managed Care – PPO | Admitting: Family Medicine

## 2020-03-29 ENCOUNTER — Telehealth: Payer: Self-pay

## 2020-03-29 DIAGNOSIS — Z0289 Encounter for other administrative examinations: Secondary | ICD-10-CM

## 2020-03-29 DIAGNOSIS — I10 Essential (primary) hypertension: Secondary | ICD-10-CM

## 2020-03-29 DIAGNOSIS — R002 Palpitations: Secondary | ICD-10-CM

## 2020-03-29 DIAGNOSIS — E785 Hyperlipidemia, unspecified: Secondary | ICD-10-CM

## 2020-03-29 MED ORDER — HYDROCHLOROTHIAZIDE 25 MG PO TABS: 25.0000 mg | ORAL_TABLET | Freq: Every day | ORAL | 0 refills | Status: DC

## 2020-03-29 MED ORDER — FENOFIBRATE 145 MG PO TABS: 145.0000 mg | ORAL_TABLET | Freq: Every day | ORAL | 3 refills | Status: DC

## 2020-03-29 MED ORDER — METOPROLOL SUCCINATE ER 100 MG PO TB24: ORAL_TABLET | ORAL | 0 refills | Status: DC

## 2020-03-29 MED ORDER — SITAGLIPTIN PHOSPHATE 25 MG PO TABS: 25.0000 mg | ORAL_TABLET | Freq: Every day | ORAL | 0 refills | Status: DC

## 2020-03-29 MED ORDER — METFORMIN HCL 1000 MG PO TABS: 1000.0000 mg | ORAL_TABLET | Freq: Two times a day (BID) | ORAL | 0 refills | Status: DC

## 2020-03-29 MED ORDER — METOPROLOL SUCCINATE ER 25 MG PO TB24: 25.0000 mg | ORAL_TABLET | Freq: Every day | ORAL | 0 refills | Status: DC

## 2020-03-29 NOTE — Telephone Encounter (Signed)
Pt was a no show to appt scheduled today and walked in to let us know her car had broken down. She hoped to get her car back a week from today. She was offered work in [employer] but said she will not have a ride. Pt will schedule next week. She is out of all med and is asking for short supply to make it until appt. This was sent to  The pharmacy

## 2020-04-07 ENCOUNTER — Ambulatory Visit (INDEPENDENT_AMBULATORY_CARE_PROVIDER_SITE_OTHER): Payer: BC Managed Care – PPO | Admitting: Family Medicine

## 2020-04-07 ENCOUNTER — Other Ambulatory Visit: Payer: Self-pay

## 2020-04-07 ENCOUNTER — Encounter: Payer: Self-pay | Admitting: Family Medicine

## 2020-04-07 VITALS — BP 132/76 | HR 68 | Temp 98.2°F | Resp 18 | Ht 62.0 in | Wt 159.5 lb

## 2020-04-07 DIAGNOSIS — R002 Palpitations: Secondary | ICD-10-CM

## 2020-04-07 DIAGNOSIS — E119 Type 2 diabetes mellitus without complications: Secondary | ICD-10-CM | POA: Diagnosis not present

## 2020-04-07 DIAGNOSIS — E785 Hyperlipidemia, unspecified: Secondary | ICD-10-CM

## 2020-04-07 DIAGNOSIS — I10 Essential (primary) hypertension: Secondary | ICD-10-CM

## 2020-04-07 DIAGNOSIS — Z5320 Procedure and treatment not carried out because of patient's decision for unspecified reasons: Secondary | ICD-10-CM

## 2020-04-07 LAB — POCT GLYCOSYLATED HEMOGLOBIN (HGB A1C)
HbA1c POC (<> result, manual entry): 5 % (ref 4.0–5.6)
HbA1c, POC (controlled diabetic range): 5 % (ref 0.0–7.0)
HbA1c, POC (prediabetic range): 5 % — AB (ref 5.7–6.4)
Hemoglobin A1C: 5 % (ref 4.0–5.6)

## 2020-04-07 MED ORDER — METOPROLOL SUCCINATE ER 25 MG PO TB24: 25.0000 mg | ORAL_TABLET | Freq: Every day | ORAL | 5 refills | Status: DC

## 2020-04-07 MED ORDER — METOPROLOL SUCCINATE ER 100 MG PO TB24: ORAL_TABLET | ORAL | 5 refills | Status: DC

## 2020-04-07 MED ORDER — HYDROCHLOROTHIAZIDE 25 MG PO TABS: 25.0000 mg | ORAL_TABLET | Freq: Every day | ORAL | 5 refills | Status: DC

## 2020-04-07 MED ORDER — FENOFIBRATE 145 MG PO TABS: 145.0000 mg | ORAL_TABLET | Freq: Every day | ORAL | 11 refills | Status: DC

## 2020-04-07 MED ORDER — METFORMIN HCL 1000 MG PO TABS: 1000.0000 mg | ORAL_TABLET | Freq: Two times a day (BID) | ORAL | 5 refills | Status: DC

## 2020-04-07 NOTE — Patient Instructions (Signed)
Keep up the good work.  Discontinue januvia.   Continue all other medications and they were refilled today for 30d and 5 refills.    Follow in 4 months.

## 2020-04-07 NOTE — Progress Notes (Signed)
SUBJECTIVE Chief Complaint  Patient presents with  . Hypertension    Needs refills on meds. Checking sugars every once in a while. Has been WNL when it was checked   . Diabetes    HPI: Teresa Calderon is a 54 y.o. female present for [facility] follow up type 2 diabetes mellitus ([place]) Diagnosed 10/26/2018 with a A1c 13.9.  Reports compliance with Januvia 25 mg and Metformin 1000 mg twice daily.  She is not routinely checking her glucose levels now since she has been so well controlled.  She has been doing really well and slowly losing her weight.Patient denies dizziness, hyperglycemic or hypoglycemic events. Patient denies numbness, tingling in the extremities or nonhealing wounds of feet.  PNA series:  PNA23 05/2019; prevnar due 05/2020 Flu shot: UTD today (recommneded yearly) BMP: 02/22/2019- WNL Foot exam: completed 04/07/2020 Eye exam: referred once- did not go. Referred -educated on Dm eye exam yearly. She will need to check with her insurance to see if they will cover DM eye exam.  A1c: 13.9 (10/26/2018)--> 5.4 >> 5.3> 5.0  Essential hypertension, benign/. Morbid obesity (HCC)/Ectatic abdominal aorta (HCC)/Elevated LFTs/Hepatic steatosis/Hyperlipidemia LDL goal <130/statin declined Patient reports compliance with metoprolol 125 mg daily and HCTZ 25 mg daily.  SHe is taking the fenofibrate and using the fish oil supplementation. Patient denies chest pain, shortness of breath, dizziness or lower extremity edema.  She does still have palpitations-without symptoms..  Labs due today. RF: Hypertension, hyperlipidemia, Fhx coronary artery disease  ROS: See pertinent positives and negatives per HPI.  Patient Active Problem List   Diagnosis Date Noted  . Statin declined 02/10/2019  . Ectatic abdominal aorta ([place]) 11/03/2018  . Controlled type 2 diabetes mellitus without complication, without long-term current use of insulin ([place]) 10/28/2018  . Leukocytosis 10/28/2018  . Elevated LFTs 10/28/2018    . Tobacco abuse 10/26/2018  . Witnessed apneic spells 04/11/2017  . Snoring 04/11/2017  . Daytime somnolence 04/11/2017  . Hyperlipidemia LDL goal <130 02/21/2016  . Vitamin D deficiency 01/31/2015  . Essential hypertension, benign 03/08/2014  . Palpitations 01/20/2012  . Anxiety     Social History   Tobacco Use  . Smoking status: Current Every Day Smoker    Packs/day: 0.50    Years: 20.00    Pack years: 10.00    Types: Cigarettes  . Smokeless tobacco: Never Used  Substance Use Topics  . Alcohol use: No    Alcohol/week: 0.0 standard drinks    Current Outpatient Medications:  .  blood glucose meter kit and supplies KIT, Dispense based on patient and insurance preference. Use up to two times daily as directed. ., Disp: 1 each, Rfl: 0 .  cholecalciferol (VITAMIN D) 1000 units tablet, Take 1 tablet (1,000 Units total) by mouth daily. (Patient taking differently: Take 1,000 Units by mouth 2 (two) times a day. ), Disp: , Rfl:  .  fenofibrate (TRICOR) 145 MG tablet, Take 1 tablet (145 mg total) by mouth daily., Disp: 30 tablet, Rfl: 11 .  hydrochlorothiazide (HYDRODIURIL) 25 MG tablet, Take 1 tablet (25 mg total) by mouth daily., Disp: 30 tablet, Rfl: 5 .  metFORMIN (GLUCOPHAGE) 1000 MG tablet, Take 1 tablet (1,000 mg total) by mouth 2 (two) times daily with a meal., Disp: 30 tablet, Rfl: 5 .  metoprolol succinate (TOPROL-XL) 100 MG 24 hr tablet, TAKE 1 TABLET BY MOUTH ONCE DAILY WITH OR IMMEDIATELY FOLLOWING A MEAL, Disp: 30 tablet, Rfl: 5 .  metoprolol succinate (TOPROL-XL) 25 MG 24 hr  tablet, Take 1 tablet (25 mg total) by mouth daily., Disp: 30 tablet, Rfl: 5 .  Multiple Vitamin (MULTIVITAMIN) tablet, Take 1 tablet by mouth daily., Disp: , Rfl:  .  Omega-3 Fatty Acids (FISH OIL) 1000 MG CAPS, Take 1 capsule by mouth daily., Disp: , Rfl:  .  senna-docusate (SENOKOT-S) 8.6-50 MG tablet, Take 1-2 tablets by mouth 2 (two) times daily., Disp: 120 tablet, Rfl: 5  No Known  Allergies  OBJECTIVE: BP 132/76 (BP Location: Right Arm, Patient Position: Sitting, Cuff Size: Normal)   Pulse 68   Temp 98.2 F (36.8 C) (Temporal)   Resp 18   Ht '5\' 2"'  (1.575 m)   Wt 159 lb 8 oz (72.3 kg)   LMP 04/17/2015   SpO2 99%   BMI 29.17 kg/m  Gen: Afebrile. No acute distress.  HENT: AT. [place].  MMM.  Eyes:Pupils Equal Round Reactive to light, Extraocular movements intact,  Conjunctiva without redness, discharge or icterus. Neck/lymp/endocrine: Supple, no lymphadenopathy, no thyromegaly CV: RRR no murmur, no edema, +2/4 P posterior tibialis pulses Chest: CTAB, no wheeze or crackles Abd: Soft. NTND. BS present.  No masses palpated.  Skin: No rashes, purpura or petechiae.  Neuro:  Normal gait. PERLA. EOMi. Alert. Oriented. Psych: Normal affect, dress and demeanor. Normal speech. Normal thought content and judgment.   Results for orders placed or performed in visit on 04/07/20 (from the past 24 hour(s))  POCT glycosylated hemoglobin (Hb A1C)     Status: Abnormal   Collection Time: 04/07/20  2:13 PM  Result Value Ref Range   Hemoglobin A1C 5.0 4.0 - 5.6 %   HbA1c POC (<> result, manual entry) 5.0 4.0 - 5.6 %   HbA1c, POC (prediabetic range) 5.0 (A) 5.7 - 6.4 %   HbA1c, POC (controlled diabetic range) 5.0 0.0 - 7.0 %     ASSESSMENT AND PLAN: Teresa Calderon is a 54 y.o. female present for  type 2 diabetes mellitus ([place]) Patient has done excellent since being diagnosed with diabetes. -A1c decreased again today to 5.0 despite decreasing her regimen last time.  This is wonderful. -Discontinue Januvia -Continue Metformin 1000 mg twice daily (she frequently forgets the second dose)>> hopefully will be able to lower her Metformin also next appointment. PNA series:  PNA23 05/2019; prevnar due 05/2020 Flu shot: UTD today (recommneded yearly) BMP: 02/22/2019- WNL Foot exam: completed 04/07/2020 Eye exam: referred once- did not go. Referred -educated on Dm eye exam yearly. She will  need to check with her insurance to see if they will cover DM eye exam.  A1c: 13.9 (10/26/2018)--> 5.4 >> 5.3> 5.0 F/u 4- 5 months  Essential hypertension, benign/. Morbid obesity (HCC)/Ectatic abdominal aorta (HCC)/Elevated LFTs/Hepatic steatosis/Hyperlipidemia LDL goal <130 -Stable -Continue Metoprolol 125 mg daily  -Continue HCTZ to  37m daily 2/2 low potassium.  -Continue fenofibrate.   - Declines statin.   -CBC, CMP, TSH and lipid collected today> if indicated would again try to get patient to try a statin however she has declined in the past. ABD [place]: Maximum aortic diameter of 2.6 cm. Ectatic abdominal aorta at risk for aneurysm development. Recommend followup by ultrasound in 5 years. DUE 10/2023 - f/u 4-5 mos   Orders Placed This Encounter  Procedures  . CBC  . Comp Met (CMET)  . TSH  . Lipid panel  . POCT glycosylated hemoglobin (Hb A1C)   Meds ordered this encounter  Medications  . metFORMIN (GLUCOPHAGE) 1000 MG tablet    Sig: Take 1 tablet (  1,000 mg total) by mouth 2 (two) times daily with a meal.    Dispense:  30 tablet    Refill:  5  . metoprolol succinate (TOPROL-XL) 100 MG 24 hr tablet    Sig: TAKE 1 TABLET BY MOUTH ONCE DAILY WITH OR IMMEDIATELY FOLLOWING A MEAL    Dispense:  30 tablet    Refill:  5  . metoprolol succinate (TOPROL-XL) 25 MG 24 hr tablet    Sig: Take 1 tablet (25 mg total) by mouth daily.    Dispense:  30 tablet    Refill:  5    Add on to 100 mg tab  . hydrochlorothiazide (HYDRODIURIL) 25 MG tablet    Sig: Take 1 tablet (25 mg total) by mouth daily.    Dispense:  30 tablet    Refill:  5    Dc other doses  . fenofibrate (TRICOR) 145 MG tablet    Sig: Take 1 tablet (145 mg total) by mouth daily.    Dispense:  30 tablet    Refill:  11   Referral Orders  No referral(s) requested today    Howard Pouch, DO 04/07/2020

## 2020-04-08 LAB — LIPID PANEL
Cholesterol: 176 mg/dL (ref <200)
HDL: 59 mg/dL (ref >=50)
LDL Cholesterol (Calc): 98 mg/dL (calc)
Non-HDL Cholesterol (Calc): 117 mg/dL (calc) (ref <130)
Total CHOL/HDL Ratio: 3 (calc) (ref <5.0)
Triglycerides: 97 mg/dL (ref <150)

## 2020-04-08 LAB — CBC
HCT: 43.8 % (ref 35.0–45.0)
Hemoglobin: 14.8 g/dL (ref 11.7–15.5)
MCH: 30.3 pg (ref 27.0–33.0)
MCHC: 33.8 g/dL (ref 32.0–36.0)
MCV: 89.6 fL (ref 80.0–100.0)
MPV: 11.5 fL (ref 7.5–12.5)
Platelets: 273 10*3/uL (ref 140–400)
RBC: 4.89 10*6/uL (ref 3.80–5.10)
RDW: 12.2 % (ref 11.0–15.0)
WBC: 9.8 10*3/uL (ref 3.8–10.8)

## 2020-04-08 LAB — COMPREHENSIVE METABOLIC PANEL
AG Ratio: 1.8 (calc) (ref 1.0–2.5)
ALT: 19 U/L (ref 6–29)
AST: 25 U/L (ref 10–35)
Albumin: 4.3 g/dL (ref 3.6–5.1)
Alkaline phosphatase (APISO): 54 U/L (ref 37–153)
BUN: 21 mg/dL (ref 7–25)
CO2: 21 mmol/L (ref 20–32)
Calcium: 9.8 mg/dL (ref 8.6–10.4)
Chloride: 107 mmol/L (ref 98–110)
Creat: 0.6 mg/dL (ref 0.50–1.05)
Globulin: 2.4 g/dL (calc) (ref 1.9–3.7)
Glucose, Bld: 83 mg/dL (ref 65–99)
Potassium: 4.2 mmol/L (ref 3.5–5.3)
Sodium: 141 mmol/L (ref 135–146)
Total Bilirubin: 0.5 mg/dL (ref 0.2–1.2)
Total Protein: 6.7 g/dL (ref 6.1–8.1)

## 2020-04-08 LAB — TSH: TSH: 1.02 mIU/L

## 2020-08-08 ENCOUNTER — Ambulatory Visit: Payer: BC Managed Care – PPO | Admitting: Family Medicine

## 2020-08-23 ENCOUNTER — Ambulatory Visit: Payer: BC Managed Care – PPO | Admitting: Family Medicine

## 2020-09-05 ENCOUNTER — Encounter: Payer: Self-pay | Admitting: Family Medicine

## 2020-09-05 ENCOUNTER — Other Ambulatory Visit: Payer: Self-pay

## 2020-09-05 ENCOUNTER — Ambulatory Visit: Payer: BC Managed Care – PPO | Admitting: Family Medicine

## 2020-09-05 VITALS — BP 130/86 | HR 68 | Temp 98.1°F | Resp 16 | Ht 62.0 in | Wt 163.2 lb

## 2020-09-05 DIAGNOSIS — I10 Essential (primary) hypertension: Secondary | ICD-10-CM | POA: Diagnosis not present

## 2020-09-05 DIAGNOSIS — R002 Palpitations: Secondary | ICD-10-CM

## 2020-09-05 DIAGNOSIS — Z23 Encounter for immunization: Secondary | ICD-10-CM | POA: Diagnosis not present

## 2020-09-05 DIAGNOSIS — E119 Type 2 diabetes mellitus without complications: Secondary | ICD-10-CM

## 2020-09-05 LAB — POCT GLYCOSYLATED HEMOGLOBIN (HGB A1C)
HbA1c POC (<> result, manual entry): 5.1 % (ref 4.0–5.6)
HbA1c, POC (controlled diabetic range): 5.1 % (ref 0.0–7.0)
HbA1c, POC (prediabetic range): 5.1 % — AB (ref 5.7–6.4)
Hemoglobin A1C: 5.1 % (ref 4.0–5.6)

## 2020-09-05 MED ORDER — METOPROLOL SUCCINATE ER 100 MG PO TB24: ORAL_TABLET | ORAL | 5 refills | Status: DC

## 2020-09-05 MED ORDER — METFORMIN HCL 500 MG PO TABS: 1000.0000 mg | ORAL_TABLET | Freq: Every day | ORAL | 5 refills | Status: DC

## 2020-09-05 MED ORDER — HYDROCHLOROTHIAZIDE 25 MG PO TABS: 25.0000 mg | ORAL_TABLET | Freq: Every day | ORAL | 5 refills | Status: DC

## 2020-09-05 MED ORDER — METOPROLOL SUCCINATE ER 25 MG PO TB24: 25.0000 mg | ORAL_TABLET | Freq: Every day | ORAL | 5 refills | Status: DC

## 2020-09-05 NOTE — Progress Notes (Signed)
SUBJECTIVE Chief Complaint  Patient presents with  . Follow-up    CMC, pt is not fasting    HPI: Teresa Calderon is a 54 y.o. female present for [facility] follow up type 2 diabetes mellitus ([place]) Diagnosed 10/26/2018 with a A1c 13.9.    Reports compliance with metformin 1000 mg daily.  Januvia had been discontinued last visit secondary to very well controlled diabetes.  She has continued to be able to keep the weight off by exercise and diet.   PNA series:  PNA23 05/2019 UTD Flu shot: UTD today (recommneded yearly) Foot exam: completed 04/07/2020 Eye exam: referred today A1c: 13.9 (10/26/2018)--> 5.4 >> 5.3> 5.0> 5.1  Essential hypertension, benign/. Morbid obesity (HCC)/Ectatic abdominal aorta (HCC)/Elevated LFTs/Hepatic steatosis/Hyperlipidemia LDL goal <130/statin declined Patient reports compliance with metoprolol 125 mg daily and HCTZ 25 mg daily.  SHe is taking the fenofibrate and using the fish oil supplementation. Patient denies chest pain, shortness of breath, dizziness or lower extremity edema.  She does still have palpitations-without symptoms.  Labs due today. RF: Hypertension, hyperlipidemia, Fhx coronary artery disease  ROS: See pertinent positives and negatives per HPI.  Patient Active Problem List   Diagnosis Date Noted  . Statin declined 02/10/2019  . Ectatic abdominal aorta ([place]) 11/03/2018  . Controlled type 2 diabetes mellitus without complication, without long-term current use of insulin ([place]) 10/28/2018  . Leukocytosis 10/28/2018  . Elevated LFTs 10/28/2018  . Tobacco abuse 10/26/2018  . Witnessed apneic spells 04/11/2017  . Snoring 04/11/2017  . Daytime somnolence 04/11/2017  . Hyperlipidemia LDL goal <130 02/21/2016  . Vitamin D deficiency 01/31/2015  . Essential hypertension, benign 03/08/2014  . Palpitations 01/20/2012  . Anxiety     Social History   Tobacco Use  . Smoking status: Current Every Day Smoker    Packs/day: 0.50    Years: 20.00    Pack  years: 10.00    Types: Cigarettes  . Smokeless tobacco: Never Used  Substance Use Topics  . Alcohol use: No    Alcohol/week: 0.0 standard drinks    Current Outpatient Medications:  .  blood glucose meter kit and supplies KIT, Dispense based on patient and insurance preference. Use up to two times daily as directed. ., Disp: 1 each, Rfl: 0 .  Cholecalciferol 50 MCG (2000 UT) TABS, Take 2,000 Units by mouth daily., Disp: , Rfl:  .  fenofibrate (TRICOR) 145 MG tablet, Take 1 tablet (145 mg total) by mouth daily., Disp: 30 tablet, Rfl: 11 .  hydrochlorothiazide (HYDRODIURIL) 25 MG tablet, Take 1 tablet (25 mg total) by mouth daily., Disp: 30 tablet, Rfl: 5 .  metFORMIN (GLUCOPHAGE) 500 MG tablet, Take 2 tablets (1,000 mg total) by mouth daily with breakfast., Disp: 30 tablet, Rfl: 5 .  metoprolol succinate (TOPROL-XL) 100 MG 24 hr tablet, TAKE 1 TABLET BY MOUTH ONCE DAILY WITH OR IMMEDIATELY FOLLOWING A MEAL, Disp: 30 tablet, Rfl: 5 .  metoprolol succinate (TOPROL-XL) 25 MG 24 hr tablet, Take 1 tablet (25 mg total) by mouth daily., Disp: 30 tablet, Rfl: 5 .  Multiple Vitamin (MULTIVITAMIN) tablet, Take 1 tablet by mouth daily., Disp: , Rfl:  .  Omega-3 Fatty Acids (FISH OIL) 1000 MG CAPS, Take 1 capsule by mouth daily., Disp: , Rfl:   No Known Allergies  OBJECTIVE: BP 130/86 (BP Location: Left Arm, Patient Position: Sitting, Cuff Size: Normal)   Pulse 68   Temp 98.1 F (36.7 C) (Oral)   Resp 16   Ht '5\' 2"'  (1.575  m)   Wt 163 lb 3.2 oz (74 kg)   LMP 04/17/2015   SpO2 98%   BMI 29.85 kg/m  Gen: Afebrile. No acute distress.  Nontoxic.  Well-developed, well-nourished, overweight female. HENT: AT. [place].  Eyes:Pupils Equal Round Reactive to light, Extraocular movements intact,  Conjunctiva without redness, discharge or icterus. Neck/lymp/endocrine: Supple, no lymphadenopathy, no thyromegaly CV: RRR no murmur, no edema, +2/4 P posterior tibialis pulses Chest: CTAB, no wheeze or crackles Abd:  Soft. NTND. BS present.  No masses palpated.  Neuro:  Normal gait. PERLA. EOMi. Alert. Oriented x3 Psych: Normal affect, dress and demeanor. Normal speech. Normal thought content and judgment..     Results for orders placed or performed in visit on 09/05/20 (from the past 24 hour(s))  POCT glycosylated hemoglobin (Hb A1C)     Status: Abnormal   Collection Time: 09/05/20  2:35 PM  Result Value Ref Range   Hemoglobin A1C 5.1 4.0 - 5.6 %   HbA1c POC (<> result, manual entry) 5.1 4.0 - 5.6 %   HbA1c, POC (prediabetic range) 5.1 (A) 5.7 - 6.4 %   HbA1c, POC (controlled diabetic range) 5.1 0.0 - 7.0 %     ASSESSMENT AND PLAN: Teresa Calderon is a 54 y.o. female present for  type 2 diabetes mellitus ([place]) Patient has done excellent since being diagnosed with diabetes. -A1c stable at 5.1 -Discontinued Januvia last appt - Decrease  Metformin 1000 mg QD to 500 mg daily.  PNA series:  PNA23 05/2019 UTD Flu shot: UTD today (recommneded yearly) Foot exam: completed 04/07/2020 Eye exam: referred today Microalbumin collected today A1c: 13.9 (10/26/2018)--> 5.4 >> 5.3> 5.0>5.1 F/u 5.5 months  Essential hypertension, benign/. Morbid obesity (HCC)/Ectatic abdominal aorta (HCC)/Elevated LFTs/Hepatic steatosis/Hyperlipidemia LDL goal <130 -Stable -Continue metoprolol 125 mg daily  -Continue HCTZ to  30m daily (potassium low with higher doses) -Continue fenofibrate.   - Declines statin.   ABD [place]: Maximum aortic diameter of 2.6 cm. Ectatic abdominal aorta at risk for aneurysm development. Recommend followup by ultrasound in 5 years. DUE 10/2023 - f/u 5.5 mos  Need for influenza vaccine: Flu and vaccine administered today.   Orders Placed This Encounter  Procedures  . Flu Vaccine QUAD 36+ mos IM  . Urine Microalbumin w/creat. ratio  . Ambulatory referral to Ophthalmology  . POCT glycosylated hemoglobin (Hb A1C)   Meds ordered this encounter  Medications  . metoprolol succinate  (TOPROL-XL) 25 MG 24 hr tablet    Sig: Take 1 tablet (25 mg total) by mouth daily.    Dispense:  30 tablet    Refill:  5    Add on to 100 mg tab  . metoprolol succinate (TOPROL-XL) 100 MG 24 hr tablet    Sig: TAKE 1 TABLET BY MOUTH ONCE DAILY WITH OR IMMEDIATELY FOLLOWING A MEAL    Dispense:  30 tablet    Refill:  5  . hydrochlorothiazide (HYDRODIURIL) 25 MG tablet    Sig: Take 1 tablet (25 mg total) by mouth daily.    Dispense:  30 tablet    Refill:  5  . metFORMIN (GLUCOPHAGE) 500 MG tablet    Sig: Take 2 tablets (1,000 mg total) by mouth daily with breakfast.    Dispense:  30 tablet    Refill:  5    DC higher dose from prior    Referral Orders     Ambulatory referral to Ophthalmology  RHoward Pouch DO 09/05/2020

## 2020-09-05 NOTE — Patient Instructions (Signed)
Great to see you today. You are doing great with your diabetes!!! I placed a referral for eye exam- they will call you.   We decreased your metformin to 500 mg a day.   Next appt end of February, unless needed sooner.

## 2020-09-06 LAB — MICROALBUMIN / CREATININE URINE RATIO
Creatinine,U: 142.2 mg/dL
Microalb Creat Ratio: 1.7 mg/g (ref 0.0–30.0)
Microalb, Ur: 2.4 mg/dL — ABNORMAL HIGH (ref 0.0–1.9)

## 2020-09-07 ENCOUNTER — Telehealth: Payer: Self-pay | Admitting: Family Medicine

## 2020-09-07 MED ORDER — LISINOPRIL 2.5 MG PO TABS: 2.5000 mg | ORAL_TABLET | Freq: Every day | ORAL | 5 refills | Status: DC

## 2020-09-07 NOTE — Telephone Encounter (Signed)
Provider appt made

## 2020-09-07 NOTE — Telephone Encounter (Signed)
Patient advised and voiced understanding. 5 month provider appointment

## 2020-09-07 NOTE — Telephone Encounter (Signed)
LM for pt to returncall

## 2020-09-07 NOTE — Telephone Encounter (Signed)
Please inform patient she had a mild elevation in the protein in her urine.  This is most likely secondary to blood pressure causes since her diabetes is still well controlled. I have called in a medication for her to start to help keep her blood pressure closer to goal of 120/70.  This is a very small amount of dose of lisinopril at 2.5 mg daily.  Lisinopril also has the added benefit of kidney protection.  Follow-up per timeline discussed at her appointment.

## 2020-11-02 LAB — HM DIABETES EYE EXAM

## 2020-11-15 ENCOUNTER — Other Ambulatory Visit: Payer: Self-pay

## 2020-11-15 NOTE — Telephone Encounter (Signed)
Pt pharmacy requesting the following meds. Meds not on med list. Please advise

## 2020-11-20 MED ORDER — ACCU-CHEK GUIDE VI STRP: ORAL_STRIP | 12 refills | Status: DC

## 2020-11-20 MED ORDER — ACCU-CHEK FASTCLIX LANCETS MISC: 11 refills | Status: DC

## 2021-01-17 ENCOUNTER — Ambulatory Visit (INDEPENDENT_AMBULATORY_CARE_PROVIDER_SITE_OTHER): Payer: BC Managed Care – PPO

## 2021-01-17 ENCOUNTER — Other Ambulatory Visit: Payer: Self-pay

## 2021-01-17 ENCOUNTER — Ambulatory Visit (HOSPITAL_BASED_OUTPATIENT_CLINIC_OR_DEPARTMENT_OTHER): Payer: BC Managed Care – PPO

## 2021-01-17 ENCOUNTER — Ambulatory Visit: Payer: BC Managed Care – PPO | Admitting: Family Medicine

## 2021-01-17 ENCOUNTER — Encounter: Payer: Self-pay | Admitting: Family Medicine

## 2021-01-17 VITALS — BP 142/78 | HR 95 | Temp 99.5°F | Ht 62.0 in | Wt 166.0 lb

## 2021-01-17 DIAGNOSIS — M79604 Pain in right leg: Secondary | ICD-10-CM | POA: Diagnosis not present

## 2021-01-17 NOTE — Progress Notes (Signed)
This visit occurred during the SARS-CoV-2 public health emergency.  Safety protocols were in place, including screening questions prior to the visit, additional usage of staff PPE, and extensive cleaning of exam room while observing appropriate contact time as indicated for disinfecting solutions.    Teresa Calderon , Jun 04, 1966, 55 y.o., female MRN: [id number] Patient Care Team    Relationship Specialty Notifications Start End  Ma Hillock, DO PCP - General Family Medicine  01/17/16     Chief Complaint  Patient presents with  . Leg Pain    Pt c/o right lower leg pain worsen in evenings x 4 days; Denies SOB, CP, recent traveling, swelling     Subjective: Pt presents for an OV with complaints of right lower extremity pain of 4 days duration.  Patient reports on Saturday she had a different pair of tennis shoes on and did a great deal of walking on hard surfaces.  She noted she had right lower extremity pain just below her medial aspect of her calf muscle.  She denies any redness or swelling over this area.  She denies any fever or shortness of breath.  She does have history of varicose veins of bilateral legs.  She denies any history of blood clots.  She denies any family history of blood clots.  She does report a few more occasions of palpitations.  She does report compliance with her metoprolol 125 mg daily.  She does not have a history of an arrhythmia.  She is not anticoagulated.  She is a smoker.  No recent travel or sedentary state.  Depression screen [facility] 2/9 01/17/2021 04/07/2020 10/26/2018 04/08/2018 10/10/2017  Decreased Interest 0 0 0 0 0  Down, Depressed, Hopeless 0 - 0 0 0  PHQ - 2 Score 0 0 0 0 0    No Known Allergies Social History   Social History Narrative   Lives with husband Elta [place])  and son.   HS grad, [employer]	[place].    Smoke detector in the home, wears her seat belt.    No firearms.    Feels safe in relationship.    Past Medical History:   Diagnosis Date  . Abnormal LFTs 02/23/2012  . Anxiety   . Cervical cancer screening 02/19/2012  . Hyperglycemia   . Hyperlipidemia   . Hypertension   . Tobacco abuse 01/26/2012   Past Surgical History:  Procedure Laterality Date  . CESAREAN SECTION  1990   Family History  Problem Relation Age of Onset  . Hypertension Mother   . Hyperlipidemia Mother   . Coronary artery disease Mother 36       Died in her sleep.  Not a proven MI.  No history prior of this.  Marland Kitchen COPD Father        smoked  . Breast cancer Maternal Aunt    Allergies as of 01/17/2021   No Known Allergies     Medication List       Accurate as of January 17, 2021  5:10 PM. If you have any questions, ask your nurse or doctor.        Accu-Chek FastClix Lancets Misc Monitor fasting blood sugars daily.   Accu-Chek Guide test strip Generic drug: glucose blood Use as instructed   blood glucose meter kit and supplies Kit Dispense based on patient and insurance preference. Use up to two times daily as directed. .   Cholecalciferol 50 MCG (2000 UT) Tabs Take 2,000 Units by mouth daily.  fenofibrate 145 MG tablet Commonly known as: TRICOR Take 1 tablet (145 mg total) by mouth daily.   Fish Oil 1000 MG Caps Take 1 capsule by mouth daily.   hydrochlorothiazide 25 MG tablet Commonly known as: HYDRODIURIL Take 1 tablet (25 mg total) by mouth daily.   lisinopril 2.5 MG tablet Commonly known as: ZESTRIL Take 1 tablet (2.5 mg total) by mouth daily.   metFORMIN 500 MG tablet Commonly known as: GLUCOPHAGE Take 2 tablets (1,000 mg total) by mouth daily with breakfast.   metoprolol succinate 25 MG 24 hr tablet Commonly known as: TOPROL-XL Take 1 tablet (25 mg total) by mouth daily.   metoprolol succinate 100 MG 24 hr tablet Commonly known as: TOPROL-XL TAKE 1 TABLET BY MOUTH ONCE DAILY WITH OR IMMEDIATELY FOLLOWING A MEAL   multivitamin tablet Take 1 tablet by mouth daily.       All past medical  history, surgical history, allergies, family history, immunizations andmedications were updated in the EMR today and reviewed under the history and medication portions of their EMR.     ROS: Negative, with the exception of above mentioned in HPI   Objective:  BP (!) 142/78   Pulse 95   Temp 99.5 F (37.5 C) (Oral)   Ht 5' 2" (1.575 m)   Wt 166 lb (75.3 kg)   LMP 04/17/2015   SpO2 96%   BMI 30.36 kg/m  Body mass index is 30.36 kg/m. Gen: Afebrile. No acute distress. Nontoxic in appearance, well developed, well nourished.  HENT: AT. [place].  No cough.  No hoarseness.   Eyes:Pupils Equal Round Reactive to light, Extraocular movements intact,  Conjunctiva without redness, discharge or icterus. CV: RRR no murmur, no edema Chest: CTAB, no wheeze or crackles. Good air movement, normal resp effort.  MSK: Right lower extremity without erythema, no soft tissue swelling.  Tender to palpation medial aspect of calf muscle.  Although mild tender to palpation to the left calf muscle also.  Negative Homans.  Neurovascular intact distally. Skin: No rashes, purpura or petechiae.  Neuro: Normal gait. PERLA. EOMi. Alert. Oriented x3  Psych: Normal affect, dress and demeanor. Normal speech. Normal thought content and judgment.  No exam data present US Venous Img Lower Unilateral Right  Result Date: 01/17/2021 CLINICAL DATA:  Right lower extremity pain. EXAM: RIGHT LOWER EXTREMITY VENOUS DOPPLER ULTRASOUND TECHNIQUE: Gray-scale sonography with compression, as well as color and duplex ultrasound, were performed to evaluate the deep venous system(s) from the level of the common femoral vein through the popliteal and proximal calf veins. COMPARISON:  No prior. FINDINGS: VENOUS Normal compressibility of the common femoral, superficial femoral, and popliteal veins, as well as the visualized calf veins. Visualized portions of profunda femoral vein and great saphenous vein unremarkable. No filling defects to suggest  DVT on grayscale or color Doppler imaging. Doppler waveforms show normal direction of venous flow, normal respiratory plasticity and response to augmentation. Limited views of the contralateral common femoral vein are unremarkable. OTHER None. IMPRESSION: Negative. Electronically Signed   By: Marcello Moores  Register   On: 01/17/2021 13:06   No results found for this or any previous visit (from the past 24 hour(s)).  Assessment/Plan: Deondrea Aguado is a 55 y.o. female present for OV for  Pain of right lower extremity Abrupt onset of pain right lower extremity, possible very mild swelling near area of tenderness since there is some asymmetry in her calf muscles-which could be normal for her.  She is tender over this area  therefore will rule out DVT. - US Venous Img Lower Unilateral Right negative for DVT today.  Patient was called with results. Advised her to treat as musculoskeletal strain with NSAIDs and heat application. Follow-up 2-4 weeks if symptoms are not improving, sooner if worsening   Reviewed expectations re: course of current medical issues.  Discussed self-management of symptoms.  Outlined signs and symptoms indicating need for more acute intervention.  Patient verbalized understanding and all questions were answered.  Patient received an After-Visit Summary.    Orders Placed This Encounter  Procedures  . US Venous Img Lower Unilateral Right   No orders of the defined types were placed in this encounter.  Referral Orders  No referral(s) requested today     Note is dictated utilizing voice recognition software. Although note has been proof read prior to signing, occasional typographical errors still can be missed. If any questions arise, please do not hesitate to call for verification.   electronically signed by:  Howard Pouch, DO  [place]

## 2021-01-17 NOTE — Patient Instructions (Addendum)
Start a baby aspirin daily.  We will call you with DVT study if negative- if positive result check into ED for start of blood thinner and further evaluation.     Deep Vein Thrombosis  Deep vein thrombosis (DVT) is a condition in which a blood clot forms in a deep vein, such as a vein in the lower leg, thigh, pelvis, or arm. Deep veins are veins in the deep venous system. A clot is blood that has thickened into a gel or solid. This condition is serious and can be life-threatening if the clot travels to the lungs and causes a blockage (pulmonary embolism) in the arteries of the lung. A DVT can also damage veins in the leg. This can lead to long-term, or chronic, venous disease, leg pain, swelling, discoloration, and ulcers or sores (post-thrombotic syndrome). What are the causes? This condition may be caused by:  A slowdown of blood flow.  Damage to a vein.  A condition that causes blood to clot more easily, such as certain blood-clotting disorders. What increases the risk? The following factors may make you more likely to develop this condition:  Having obesity.  Being older, especially older than age 56.  Being inactive (sedentary lifestyle) or not moving around. This may include: ? Sitting or lying down for longer than 4-6 hours other than to sleep at night. ? Being in the hospital, having major or lengthy surgery, or having a thin, flexible tube (central line catheter) placed in a large vein.  Being pregnant, giving birth, or having recently given birth.  Taking medicines that contain estrogen, such as birth control or hormone replacement therapy.  Using products that contain nicotine or tobacco, especially if you use hormonal birth control.  Having a history of blood clots or a blood-clotting disease, a blood vessel disease (peripheral vascular disease), or congestive heart disease.  Having a history of cancer, especially if being treated with chemotherapy. What are the signs  or symptoms? Symptoms of this condition include:  Swelling, pain, pressure, or tenderness in an arm or a leg.  An arm or a leg becoming warm, red, or discolored.  A leg turning very pale. You may have a large DVT. This is rare. If the clot is in your leg, you may notice symptoms more or have worse symptoms when you stand or walk. In some cases, there are no symptoms. How is this diagnosed? This condition is diagnosed with:  Your medical history and a physical exam.  Tests, such as: ? Blood tests to check how well your blood clots. ? Doppler ultrasound. This is the best way to find a DVT. ? Venogram. Contrast dye is injected into a vein, and X-rays are taken to check for clots. How is this treated? Treatment for this condition depends on:  The cause of your DVT.  The size and location of your DVT, or having more than one DVT.  Your risk for bleeding or developing more clots.  Other medical conditions you may have. Treatment may include:  Taking a blood thinner, also called an anticoagulant, to prevent clots from forming and growing.  Wearing compression stockings, if directed.  Injecting medicines into the affected vein to break up the clot (catheter-directed thrombolysis). This is used only for severe DVT and only if a specialist recommends it.  Specific surgical procedures, when DVT is severe or hard to treat. These may be done to: ? Isolate and remove your clot. ? Place an inferior vena cava (IVC) filter in a  large vein to catch blood clots before they reach your lungs. You may get some medical treatments for 6 months or longer. Follow these instructions at home: If you are taking blood thinners:  Talk with your health care provider before you take any medicines that contain aspirin or NSAIDs, such as ibuprofen. These medicines increase your risk for dangerous bleeding.  Take your medicine exactly as told, at the same time every day. Do not skip a dose. Do not take  more than the prescribed dose. This is important.  Ask your health care provider about foods and medicines that could change the way your blood thinner works (may interact). Avoid these foods and medicines if you are told to do so.  Avoid anything that may cause bleeding or bruising. You may bleed more easily while taking blood thinners. ? Be very careful when using knives, scissors, or other sharp objects. ? Use an electric razor instead of a blade. ? Avoid activities that could cause injury or bruising, and follow instructions for preventing falls. ? Tell your health care provider if you have had any internal bleeding, bleeding ulcers, or neurologic diseases, such as strokes or cerebral aneurysms.  Wear a medical alert bracelet or carry a card that lists what medicines you take. General instructions  Take over-the-counter and prescription medicines only as told by your health care provider.  Return to your normal activities as told by your health care provider. Ask your health care provider what activities are safe for you.  If recommended, wear compression stockings as told by your health care provider. These stockings help to prevent blood clots and reduce swelling in your legs.  Keep all follow-up visits as told by your health care provider. This is important. Contact a health care provider if:  You miss a dose of your blood thinner.  You have new or worse pain, swelling, or redness in an arm or a leg.  You have worsening numbness or tingling in an arm or a leg.  You have unusual bruising. Get help right away if:  You have signs or symptoms that a blood clot has moved to the lungs. These may include: ? Shortness of breath. ? Chest pain. ? Fast or irregular heartbeats (palpitations). ? Light-headedness or dizziness. ? Coughing up blood.  You have signs or symptoms that your blood is too thin. These may include: ? Blood in your vomit, stool, or urine. ? A cut that will not  stop bleeding. ? A menstrual period that is heavier than usual. ? A severe headache or confusion. These symptoms may represent a serious problem that is an emergency. Do not wait to see if the symptoms will go away. Get medical help right away. Call your local emergency services (911 in the [place] Do not drive yourself to the hospital. Summary  Deep vein thrombosis (DVT) happens when a blood clot forms in a deep vein. This may occur in the lower leg, thigh, pelvis, or arm.  Symptoms affect the arm or leg and can include swelling, pain, tenderness, warmth, redness, or discoloration.  This condition may be treated with medicines or compression stockings. In severe cases, surgery may be done.  If you are taking blood thinners, take them exactly as told. Do not skip a dose. Do not take more than is prescribed.  Get help right away if you have shortness of breath, chest pain, fast or irregular heartbeats, or blood in your vomit, urine, or stool. This information is not intended to replace  advice given to you by your health care provider. Make sure you discuss any questions you have with your health care provider. Document Revised: 12/04/2019 Document Reviewed: 12/04/2019 Elsevier Patient Education  2021 [employer].

## 2021-01-29 ENCOUNTER — Other Ambulatory Visit: Payer: Self-pay | Admitting: Family Medicine

## 2021-02-12 ENCOUNTER — Ambulatory Visit: Payer: BC Managed Care – PPO | Admitting: Family Medicine

## 2021-02-16 ENCOUNTER — Encounter: Payer: Self-pay | Admitting: Family Medicine

## 2021-02-16 ENCOUNTER — Other Ambulatory Visit: Payer: Self-pay

## 2021-02-16 ENCOUNTER — Ambulatory Visit (INDEPENDENT_AMBULATORY_CARE_PROVIDER_SITE_OTHER): Payer: BC Managed Care – PPO | Admitting: Family Medicine

## 2021-02-16 VITALS — BP 125/80 | HR 73 | Temp 98.2°F | Ht 62.0 in | Wt 162.0 lb

## 2021-02-16 DIAGNOSIS — E785 Hyperlipidemia, unspecified: Secondary | ICD-10-CM | POA: Diagnosis not present

## 2021-02-16 DIAGNOSIS — Z720 Tobacco use: Secondary | ICD-10-CM | POA: Diagnosis not present

## 2021-02-16 DIAGNOSIS — R499 Unspecified voice and resonance disorder: Secondary | ICD-10-CM | POA: Insufficient documentation

## 2021-02-16 DIAGNOSIS — Z1231 Encounter for screening mammogram for malignant neoplasm of breast: Secondary | ICD-10-CM

## 2021-02-16 DIAGNOSIS — Z5320 Procedure and treatment not carried out because of patient's decision for unspecified reasons: Secondary | ICD-10-CM

## 2021-02-16 DIAGNOSIS — I77811 Abdominal aortic ectasia: Secondary | ICD-10-CM

## 2021-02-16 DIAGNOSIS — I10 Essential (primary) hypertension: Secondary | ICD-10-CM

## 2021-02-16 DIAGNOSIS — E119 Type 2 diabetes mellitus without complications: Secondary | ICD-10-CM

## 2021-02-16 DIAGNOSIS — E559 Vitamin D deficiency, unspecified: Secondary | ICD-10-CM | POA: Diagnosis not present

## 2021-02-16 DIAGNOSIS — R059 Cough, unspecified: Secondary | ICD-10-CM | POA: Insufficient documentation

## 2021-02-16 DIAGNOSIS — R002 Palpitations: Secondary | ICD-10-CM

## 2021-02-16 LAB — CBC WITH DIFFERENTIAL/PLATELET
Basophils Absolute: 0.1 10*3/uL (ref 0.0–0.1)
Basophils Relative: 1.2 % (ref 0.0–3.0)
Eosinophils Absolute: 0.2 10*3/uL (ref 0.0–0.7)
Eosinophils Relative: 2.3 % (ref 0.0–5.0)
HCT: 45.3 % (ref 36.0–46.0)
Hemoglobin: 15.2 g/dL — ABNORMAL HIGH (ref 12.0–15.0)
Lymphocytes Relative: 31.1 % (ref 12.0–46.0)
Lymphs Abs: 2.9 10*3/uL (ref 0.7–4.0)
MCHC: 33.5 g/dL (ref 30.0–36.0)
MCV: 90.3 fl (ref 78.0–100.0)
Monocytes Absolute: 0.7 10*3/uL (ref 0.1–1.0)
Monocytes Relative: 7.1 % (ref 3.0–12.0)
Neutro Abs: 5.4 10*3/uL (ref 1.4–7.7)
Neutrophils Relative %: 58.3 % (ref 43.0–77.0)
Platelets: 252 10*3/uL (ref 150.0–400.0)
RBC: 5.02 Mil/uL (ref 3.87–5.11)
RDW: 12.8 % (ref 11.5–15.5)
WBC: 9.2 10*3/uL (ref 4.0–10.5)

## 2021-02-16 LAB — LIPID PANEL
Cholesterol: 176 mg/dL (ref 0–200)
HDL: 61.3 mg/dL (ref >39.00)
LDL Cholesterol: 99 mg/dL (ref 0–99)
NonHDL: 114.76
Total CHOL/HDL Ratio: 3
Triglycerides: 80 mg/dL (ref 0.0–149.0)
VLDL: 16 mg/dL (ref 0.0–40.0)

## 2021-02-16 LAB — VITAMIN D 25 HYDROXY (VIT D DEFICIENCY, FRACTURES): VITD: 50.97 ng/mL (ref 30.00–100.00)

## 2021-02-16 LAB — COMPREHENSIVE METABOLIC PANEL
ALT: 17 U/L (ref 0–35)
AST: 20 U/L (ref 0–37)
Albumin: 4.3 g/dL (ref 3.5–5.2)
Alkaline Phosphatase: 51 U/L (ref 39–117)
BUN: 21 mg/dL (ref 6–23)
CO2: 29 mEq/L (ref 19–32)
Calcium: 10 mg/dL (ref 8.4–10.5)
Chloride: 104 mEq/L (ref 96–112)
Creatinine, Ser: 0.58 mg/dL (ref 0.40–1.20)
GFR: 102.02 mL/min (ref >60.00)
Glucose, Bld: 95 mg/dL (ref 70–99)
Potassium: 4.1 mEq/L (ref 3.5–5.1)
Sodium: 141 mEq/L (ref 135–145)
Total Bilirubin: 0.6 mg/dL (ref 0.2–1.2)
Total Protein: 7 g/dL (ref 6.0–8.3)

## 2021-02-16 LAB — TSH: TSH: 0.81 u[IU]/mL (ref 0.35–4.50)

## 2021-02-16 LAB — POCT GLYCOSYLATED HEMOGLOBIN (HGB A1C)
HbA1c POC (<> result, manual entry): 5.4 % (ref 4.0–5.6)
HbA1c, POC (controlled diabetic range): 5.4 % (ref 0.0–7.0)
HbA1c, POC (prediabetic range): 5.4 % — AB (ref 5.7–6.4)
Hemoglobin A1C: 5.4 % (ref 4.0–5.6)

## 2021-02-16 MED ORDER — METFORMIN HCL 500 MG PO TABS: 500.0000 mg | ORAL_TABLET | Freq: Every day | ORAL | 5 refills | Status: DC

## 2021-02-16 MED ORDER — METOPROLOL SUCCINATE ER 25 MG PO TB24: 25.0000 mg | ORAL_TABLET | Freq: Every day | ORAL | 5 refills | Status: DC

## 2021-02-16 MED ORDER — METOPROLOL SUCCINATE ER 100 MG PO TB24: ORAL_TABLET | ORAL | 5 refills | Status: DC

## 2021-02-16 MED ORDER — FENOFIBRATE 145 MG PO TABS: 145.0000 mg | ORAL_TABLET | Freq: Every day | ORAL | 11 refills | Status: DC

## 2021-02-16 MED ORDER — HYDROCHLOROTHIAZIDE 25 MG PO TABS: 25.0000 mg | ORAL_TABLET | Freq: Every day | ORAL | 5 refills | Status: DC

## 2021-02-16 MED ORDER — LISINOPRIL 2.5 MG PO TABS: 2.5000 mg | ORAL_TABLET | Freq: Every day | ORAL | 5 refills | Status: DC

## 2021-02-16 MED ORDER — OMEPRAZOLE 40 MG PO CPDR: 40.0000 mg | DELAYED_RELEASE_CAPSULE | Freq: Every day | ORAL | 5 refills | Status: DC

## 2021-02-16 NOTE — Progress Notes (Signed)
SUBJECTIVE Chief Complaint  Patient presents with  . Follow-up    [facility]; pt is fasting     HPI: Teresa Calderon is a 55 y.o. female present for [facility] follow up type 2 diabetes mellitus ([place]) Diagnosed 10/26/2018 with a A1c 13.9.    Reports compliance with metformin 1000 mg daily.  Januvia had been discontinued last visit secondary to very well controlled diabetes.  She has continued to be able to keep the weight off by exercise and diet.   PNA series:  PNA23 05/2019 UTD Flu shot: UTD today (recommneded yearly) Foot exam: completed 04/07/2020 Eye exam:completed by my eye doc in [place]. A1c: 13.9 (10/26/2018)--> 5.4 >> 5.3> 5.0> 5.1>5.4  Essential hypertension, benign/. Morbid obesity (HCC)/Ectatic abdominal aorta (HCC)/Elevated LFTs/Hepatic steatosis/Hyperlipidemia LDL goal <130/statin declined Patient reports compliance with metoprolol 125 mg daily, lisinopril 2.5 mg and HCTZ 25 mg daily.  SHe is taking the fenofibrate and using the fish oil supplementation. Patient denies dizziness, hyperglycemic or hypoglycemic events. Patient denies numbness, tingling in the extremities or nonhealing wounds of feet.  She does still have palpitations-without symptoms.  Labs due today. RF: Hypertension, hyperlipidemia, Fhx coronary artery disease  Hoarseness/cough/smoker: She reports she has had hoarseness and a change in her voice over the last few years.  She feels it is more scratchy sounding and deeper.  She is a heavy smoker.  She also endorses a mild tickle-like cough that started early last fall.  She does not have a history of reflux or reflux symptoms.  She meets criteria for CT lung cancer screening but has declined screenings in the past.  ROS: See pertinent positives and negatives per HPI.  Patient Active Problem List   Diagnosis Date Noted  . Statin declined 02/10/2019  . Ectatic abdominal aorta ([place]) 11/03/2018  . Controlled type 2 diabetes mellitus without complication, without long-term  current use of insulin ([place]) 10/28/2018  . Leukocytosis 10/28/2018  . Elevated LFTs 10/28/2018  . Tobacco abuse 10/26/2018  . Witnessed apneic spells 04/11/2017  . Snoring 04/11/2017  . Daytime somnolence 04/11/2017  . Hyperlipidemia LDL goal <130 02/21/2016  . Vitamin D deficiency 01/31/2015  . Essential hypertension, benign 03/08/2014  . Palpitations 01/20/2012  . Anxiety     Social History   Tobacco Use  . Smoking status: Current Every Day Smoker    Packs/day: 0.50    Years: 20.00    Pack years: 10.00    Types: Cigarettes  . Smokeless tobacco: Never Used  Substance Use Topics  . Alcohol use: No    Alcohol/week: 0.0 standard drinks    Current Outpatient Medications:  .  Accu-Chek FastClix Lancets MISC, Monitor fasting blood sugars daily., Disp: 99 each, Rfl: 11 .  blood glucose meter kit and supplies KIT, Dispense based on patient and insurance preference. Use up to two times daily as directed. ., Disp: 1 each, Rfl: 0 .  Cholecalciferol 50 MCG (2000 UT) TABS, Take 2,000 Units by mouth daily., Disp: , Rfl:  .  glucose blood (ACCU-CHEK GUIDE) test strip, Use as instructed, Disp: 100 each, Rfl: 12 .  Multiple Vitamin (MULTIVITAMIN) tablet, Take 1 tablet by mouth daily., Disp: , Rfl:  .  Omega-3 Fatty Acids (FISH OIL) 1000 MG CAPS, Take 1 capsule by mouth daily., Disp: , Rfl:  .  omeprazole (PRILOSEC) 40 MG capsule, Take 1 capsule (40 mg total) by mouth at bedtime., Disp: 30 capsule, Rfl: 5 .  fenofibrate (TRICOR) 145 MG tablet, Take 1 tablet (145 mg total)  by mouth daily., Disp: 30 tablet, Rfl: 11 .  hydrochlorothiazide (HYDRODIURIL) 25 MG tablet, Take 1 tablet (25 mg total) by mouth daily., Disp: 30 tablet, Rfl: 5 .  lisinopril (ZESTRIL) 2.5 MG tablet, Take 1 tablet (2.5 mg total) by mouth daily., Disp: 30 tablet, Rfl: 5 .  metFORMIN (GLUCOPHAGE) 500 MG tablet, Take 1 tablet (500 mg total) by mouth daily with breakfast., Disp: 30 tablet, Rfl: 5 .  metoprolol succinate  (TOPROL-XL) 100 MG 24 hr tablet, TAKE 1 TABLET BY MOUTH ONCE DAILY WITH OR IMMEDIATELY FOLLOWING A MEAL, Disp: 30 tablet, Rfl: 5 .  metoprolol succinate (TOPROL-XL) 25 MG 24 hr tablet, Take 1 tablet (25 mg total) by mouth daily., Disp: 30 tablet, Rfl: 5  No Known Allergies  OBJECTIVE: BP 125/80   Pulse 73   Temp 98.2 F (36.8 C) (Oral)   Ht '5\' 2"'  (1.575 m)   Wt 162 lb (73.5 kg)   LMP 04/17/2015   SpO2 100%   BMI 29.63 kg/m  Gen: Afebrile. No acute distress. overweight female.  HENT: AT. [place].  Eyes:Pupils Equal Round Reactive to light, Extraocular movements intact,  Conjunctiva without redness, discharge or icteru CV: RRR no mumrur, no edema, +2/4 P posterior tibialis pulses Chest: CTAB, no wheeze or crackles Abd: Soft. NTND. BS present. no Masses palpated.  Skin: no rashes, purpura or petechiae.  Neuro:  Normal gait. PERLA. EOMi. Alert. Oriented x3 Psych: Normal affect, dress and demeanor. Normal speech. Normal thought content and judgment  Results for orders placed or performed in visit on 02/16/21 (from the past 24 hour(s))  POCT HgB A1C     Status: Abnormal   Collection Time: 02/16/21  8:48 AM  Result Value Ref Range   Hemoglobin A1C 5.4 4.0 - 5.6 %   HbA1c POC (<> result, manual entry) 5.4 4.0 - 5.6 %   HbA1c, POC (prediabetic range) 5.4 (A) 5.7 - 6.4 %   HbA1c, POC (controlled diabetic range) 5.4 0.0 - 7.0 %     ASSESSMENT AND PLAN: Teresa Calderon is a 55 y.o. female present for  type 2 diabetes mellitus ([place]) Stable.  -Discontinued Javuvia for great control.  - decrease Metformin 500 mg daily.  - a1c collected today PNA series:  PNA23 05/2019 UTD  Flu shot: UTD today (recommneded yearly) Foot exam: completed 04/07/2020 Eye exam: referred today Microalbumin : +> started ace A1c: 13.9 (10/26/2018)--> 5.4 >> 5.3> 5.0>5.1>5.4 today !! F/u 5.5 months  Essential hypertension, benign/. Morbid obesity (HCC)/Ectatic abdominal aorta (HCC)/Elevated LFTs/Hepatic  steatosis/Hyperlipidemia LDL goal <130 - stable.  - continue lisinopril 2.5 mg qd - continue metoprolol 125 mg daily  - continue  HCTZ to  49m daily (potassium low with higher doses) - continue  fenofibrate.   - Declines statin.   - cbc, cmp, lipids ad tsh collected today ABD [place]: Maximum aortic diameter of 2.6 cm. Ectatic abdominal aorta at risk for aneurysm development. Recommend followup by ultrasound in 5 years. DUE 10/2023 - f/u 5.5 mos   Tobacco abuse Declined cessation  Vitamin D deficiency - Vitamin D (25 hydroxy)  Breast cancer screening by mammogram - MM 3D SCREEN BREAST BILATERAL; Future  Hoarseness or changing voice/Cough Chronic since early fall per pt. She is a smoker and we discussed possibly etiologies. She understands but does not want ENT referral at this time. - start omeprazole 40 qhs.  - if symptoms still present at follow up she is agreeable to ENT referral at that time.  -We will  also reoffer CT lung cancer screening at that time.  She has declined in the past.  Orders Placed This Encounter  Procedures  . MM 3D SCREEN BREAST BILATERAL  . Comp Met (CMET)  . Lipid panel  . CBC w/Diff  . TSH  . Vitamin D (25 hydroxy)  . POCT HgB A1C   Meds ordered this encounter  Medications  . metoprolol succinate (TOPROL-XL) 25 MG 24 hr tablet    Sig: Take 1 tablet (25 mg total) by mouth daily.    Dispense:  30 tablet    Refill:  5    Add on to 100 mg tab  . metoprolol succinate (TOPROL-XL) 100 MG 24 hr tablet    Sig: TAKE 1 TABLET BY MOUTH ONCE DAILY WITH OR IMMEDIATELY FOLLOWING A MEAL    Dispense:  30 tablet    Refill:  5  . DISCONTD: metFORMIN (GLUCOPHAGE) 500 MG tablet    Sig: Take 1 tablet (500 mg total) by mouth daily with breakfast.    Dispense:  30 tablet    Refill:  5  . lisinopril (ZESTRIL) 2.5 MG tablet    Sig: Take 1 tablet (2.5 mg total) by mouth daily.    Dispense:  30 tablet    Refill:  5  . hydrochlorothiazide (HYDRODIURIL) 25 MG  tablet    Sig: Take 1 tablet (25 mg total) by mouth daily.    Dispense:  30 tablet    Refill:  5  . fenofibrate (TRICOR) 145 MG tablet    Sig: Take 1 tablet (145 mg total) by mouth daily.    Dispense:  30 tablet    Refill:  11  . metFORMIN (GLUCOPHAGE) 500 MG tablet    Sig: Take 1 tablet (500 mg total) by mouth daily with breakfast.    Dispense:  30 tablet    Refill:  5  . omeprazole (PRILOSEC) 40 MG capsule    Sig: Take 1 capsule (40 mg total) by mouth at bedtime.    Dispense:  30 capsule    Refill:  5   Referral Orders  No referral(s) requested today    Howard Pouch, DO 02/16/2021

## 2021-02-16 NOTE — Patient Instructions (Addendum)
Next appt in 5.5 mos.  Great job - your a1c is 5.4 today.  BP looks great also.   Decrease metformin to one tab daily.

## 2021-02-19 ENCOUNTER — Other Ambulatory Visit: Payer: Self-pay | Admitting: Family Medicine

## 2021-02-19 MED ORDER — ROSUVASTATIN CALCIUM 10 MG PO TABS: 10.0000 mg | ORAL_TABLET | Freq: Every day | ORAL | 3 refills | Status: DC

## 2021-08-02 ENCOUNTER — Ambulatory Visit: Payer: BC Managed Care – PPO | Admitting: Family Medicine

## 2021-08-16 ENCOUNTER — Ambulatory Visit: Payer: BC Managed Care – PPO | Admitting: Family Medicine

## 2021-08-16 DIAGNOSIS — R002 Palpitations: Secondary | ICD-10-CM

## 2021-08-16 DIAGNOSIS — I10 Essential (primary) hypertension: Secondary | ICD-10-CM

## 2021-08-21 ENCOUNTER — Encounter: Payer: Self-pay | Admitting: Family Medicine

## 2021-08-21 ENCOUNTER — Ambulatory Visit: Payer: BC Managed Care – PPO | Admitting: Family Medicine

## 2021-08-21 ENCOUNTER — Other Ambulatory Visit: Payer: Self-pay

## 2021-08-21 VITALS — BP 134/80 | HR 71 | Temp 98.3°F | Ht 62.0 in | Wt 178.0 lb

## 2021-08-21 DIAGNOSIS — Z23 Encounter for immunization: Secondary | ICD-10-CM | POA: Diagnosis not present

## 2021-08-21 DIAGNOSIS — E785 Hyperlipidemia, unspecified: Secondary | ICD-10-CM | POA: Diagnosis not present

## 2021-08-21 DIAGNOSIS — Z5320 Procedure and treatment not carried out because of patient's decision for unspecified reasons: Secondary | ICD-10-CM

## 2021-08-21 DIAGNOSIS — R002 Palpitations: Secondary | ICD-10-CM

## 2021-08-21 DIAGNOSIS — I77811 Abdominal aortic ectasia: Secondary | ICD-10-CM | POA: Diagnosis not present

## 2021-08-21 DIAGNOSIS — I10 Essential (primary) hypertension: Secondary | ICD-10-CM

## 2021-08-21 DIAGNOSIS — E1169 Type 2 diabetes mellitus with other specified complication: Secondary | ICD-10-CM

## 2021-08-21 DIAGNOSIS — Z6832 Body mass index (BMI) 32.0-32.9, adult: Secondary | ICD-10-CM

## 2021-08-21 LAB — POCT GLYCOSYLATED HEMOGLOBIN (HGB A1C)
HbA1c POC (<> result, manual entry): 5.5 % (ref 4.0–5.6)
HbA1c, POC (controlled diabetic range): 5.5 % (ref 0.0–7.0)
HbA1c, POC (prediabetic range): 5.5 % — AB (ref 5.7–6.4)
Hemoglobin A1C: 5.5 % (ref 4.0–5.6)

## 2021-08-21 MED ORDER — FENOFIBRATE 145 MG PO TABS: 145.0000 mg | ORAL_TABLET | Freq: Every day | ORAL | 11 refills | Status: DC

## 2021-08-21 MED ORDER — METOPROLOL SUCCINATE ER 100 MG PO TB24: ORAL_TABLET | ORAL | 5 refills | Status: DC

## 2021-08-21 MED ORDER — LISINOPRIL 2.5 MG PO TABS: 2.5000 mg | ORAL_TABLET | Freq: Every day | ORAL | 5 refills | Status: DC

## 2021-08-21 MED ORDER — METFORMIN HCL 500 MG PO TABS: 500.0000 mg | ORAL_TABLET | Freq: Every day | ORAL | 5 refills | Status: DC

## 2021-08-21 MED ORDER — METOPROLOL SUCCINATE ER 25 MG PO TB24: 25.0000 mg | ORAL_TABLET | Freq: Every day | ORAL | 5 refills | Status: DC

## 2021-08-21 MED ORDER — ROSUVASTATIN CALCIUM 10 MG PO TABS: 10.0000 mg | ORAL_TABLET | Freq: Every day | ORAL | 11 refills | Status: DC

## 2021-08-21 MED ORDER — OMEPRAZOLE 40 MG PO CPDR: 40.0000 mg | DELAYED_RELEASE_CAPSULE | Freq: Every day | ORAL | 5 refills | Status: DC

## 2021-08-21 MED ORDER — HYDROCHLOROTHIAZIDE 25 MG PO TABS: 25.0000 mg | ORAL_TABLET | Freq: Every day | ORAL | 5 refills | Status: DC

## 2021-08-21 NOTE — Progress Notes (Signed)
SUBJECTIVE Chief Complaint  Patient presents with   Diabetes    [place]; pt is not fasting     HPI: Teresa Calderon is a 55 y.o. female present for [facility] follow up type 2 diabetes mellitus ([place]) Diagnosed 10/26/2018 with a A1c 13.9.    Reports compliance with metformin 500 mg daily.  Januvia had been discontinued last visit secondary to very well controlled diabetes and metformin decreased.  She has gained back 16 lbs of weight she lost.  PNA series:  PNA23 05/2019 UTD Flu shot: Updated today(recommneded yearly) Foot exam: completed 08/21/2021 Eye exam:completed by my eye doc in [place].11/02/2020  Essential hypertension, benign/. Morbid obesity (HCC)/Ectatic abdominal aorta (HCC)/Elevated LFTs/Hepatic steatosis/Hyperlipidemia LDL goal <130/statin declined Patient reports compliance  with metoprolol 125 mg daily, lisinopril 2.5 mg and HCTZ 25 mg daily.  SHe is taking the fenofibrate  and crestor. Patient denies chest pain, shortness of breath, dizziness or lower extremity edema.  She does still have palpitations-without symptoms.  RF: Hypertension, hyperlipidemia, Fhx coronary artery disease  ROS: See pertinent positives and negatives per HPI.  Patient Active Problem List   Diagnosis Date Noted   BMI 32.0-32.9,adult 08/21/2021   Cough 02/16/2021   Hoarseness or changing voice 02/16/2021   Statin declined 02/10/2019   Ectatic abdominal aorta ([place]) 11/03/2018   Controlled type 2 diabetes mellitus without complication, without long-term current use of insulin ([place]) 10/28/2018   Leukocytosis 10/28/2018   Elevated LFTs 10/28/2018   Tobacco abuse 10/26/2018   Witnessed apneic spells 04/11/2017   Snoring 04/11/2017   Daytime somnolence 04/11/2017   Morbid obesity ([place]) 04/11/2017   Hyperlipidemia LDL goal <130 02/21/2016   Vitamin D deficiency 01/31/2015   Essential hypertension, benign 03/08/2014   Palpitations 01/20/2012   Anxiety     Social History   Tobacco Use   Smoking status:  Every Day    Packs/day: 1.00    Years: 30.00    Pack years: 30.00    Types: Cigarettes   Smokeless tobacco: Never  Substance Use Topics   Alcohol use: No    Alcohol/week: 0.0 standard drinks    Current Outpatient Medications:    Accu-Chek FastClix Lancets MISC, Monitor fasting blood sugars daily., Disp: 99 each, Rfl: 11   blood glucose meter kit and supplies KIT, Dispense based on patient and insurance preference. Use up to two times daily as directed. ., Disp: 1 each, Rfl: 0   Cholecalciferol 50 MCG (2000 UT) TABS, Take 2,000 Units by mouth daily., Disp: , Rfl:    glucose blood (ACCU-CHEK GUIDE) test strip, Use as instructed, Disp: 100 each, Rfl: 12   Multiple Vitamin (MULTIVITAMIN) tablet, Take 1 tablet by mouth daily., Disp: , Rfl:    Omega-3 Fatty Acids (FISH OIL) 1000 MG CAPS, Take 1 capsule by mouth daily., Disp: , Rfl:    fenofibrate (TRICOR) 145 MG tablet, Take 1 tablet (145 mg total) by mouth daily., Disp: 30 tablet, Rfl: 11   hydrochlorothiazide (HYDRODIURIL) 25 MG tablet, Take 1 tablet (25 mg total) by mouth daily., Disp: 30 tablet, Rfl: 5   lisinopril (ZESTRIL) 2.5 MG tablet, Take 1 tablet (2.5 mg total) by mouth daily., Disp: 30 tablet, Rfl: 5   metFORMIN (GLUCOPHAGE) 500 MG tablet, Take 1 tablet (500 mg total) by mouth daily with breakfast., Disp: 30 tablet, Rfl: 5   metoprolol succinate (TOPROL-XL) 100 MG 24 hr tablet, TAKE 1 TABLET BY MOUTH ONCE DAILY WITH OR IMMEDIATELY FOLLOWING A MEAL, Disp: 30 tablet, Rfl: 5  metoprolol succinate (TOPROL-XL) 25 MG 24 hr tablet, Take 1 tablet (25 mg total) by mouth daily., Disp: 30 tablet, Rfl: 5   omeprazole (PRILOSEC) 40 MG capsule, Take 1 capsule (40 mg total) by mouth at bedtime., Disp: 30 capsule, Rfl: 5   rosuvastatin (CRESTOR) 10 MG tablet, Take 1 tablet (10 mg total) by mouth daily., Disp: 30 tablet, Rfl: 11  No Known Allergies  OBJECTIVE: BP 134/80   Pulse 71   Temp 98.3 F (36.8 C) (Oral)   Ht '5\' 2"'  (1.575 m)   Wt 178  lb (80.7 kg)   LMP 04/17/2015   SpO2 96%   BMI 32.56 kg/m  Gen: Afebrile. No acute distress. Nontoxic, pleasant obese female.  HENT: AT. [place].  Eyes:Pupils Equal Round Reactive to light, Extraocular movements intact,  Conjunctiva without redness, discharge or icterus. CV: RRR no murmur, no edema, +2/4 P posterior tibialis pulses Chest: CTAB, no wheeze or crackles Skin: no rashes, purpura or petechiae.  Neuro: Normal gait. PERLA. EOMi. Alert. Oriented x3 Psych: Normal affect, dress and demeanor. Normal speech. Normal thought content and judgment.  Diabetic Foot Exam - Simple   Simple Foot Form Diabetic Foot exam was performed with the following findings: Yes 08/21/2021  3:32 PM  Visual Inspection No deformities, no ulcerations, no other skin breakdown bilaterally: Yes Sensation Testing Intact to touch and monofilament testing bilaterally: Yes Pulse Check Posterior Tibialis and Dorsalis pulse intact bilaterally: Yes Comments      Results for orders placed or performed in visit on 08/21/21 (from the past 24 hour(s))  POCT HgB A1C     Status: Abnormal   Collection Time: 08/21/21  3:13 PM  Result Value Ref Range   Hemoglobin A1C 5.5 4.0 - 5.6 %   HbA1c POC (<> result, manual entry) 5.5 4.0 - 5.6 %   HbA1c, POC (prediabetic range) 5.5 (A) 5.7 - 6.4 %   HbA1c, POC (controlled diabetic range) 5.5 0.0 - 7.0 %      ASSESSMENT AND PLAN: Teresa Calderon is a 55 y.o. female present for [facility] type 2 diabetes mellitus w/ hyperlipidemia(HCC) Stable. She has gained back weight. Encouraged her to get back on track now that she is off vacation. -Discontinued Campbell Lerner for great control.  - continue Metformin 500 mg daily.  - continue crestor PNA series:  PNA23 05/2019 UTD  Flu shot: Updated today (recommneded yearly) Foot exam: completed 08/21/2021 Eye exam: referred last visit Microalbumin : +> started ace A1c: 13.9 (10/26/2018)--> 5.4 >> 5.3> 5.0>5.1>5.4> 5.5 today !! F/u 4.5 months    Essential hypertension, benign/. Morbid obesity (HCC)/Ectatic abdominal aorta (HCC)/Elevated LFTs/Hepatic steatosis/Hyperlipidemia LDL goal <70 - stable. She has gained back weight.  - continue  lisinopril 2.5 mg qd - continue metoprolol 125 mg daily  - continue   HCTZ to  76m daily (potassium low with higher doses) - continue  fenofibrate.   - continue crestor 10 mg qd  ABD [place]: Maximum aortic diameter of 2.6 cm. Ectatic abdominal aorta at risk for aneurysm development. Recommend followup by ultrasound in 5 years. DUE 10/2023 - f/u 5.5 mos   Orders Placed This Encounter  Procedures   Flu Vaccine QUAD 6+ mos PF IM (Fluarix Quad PF)   Direct LDL   Hepatic function panel   POCT HgB A1C   Meds ordered this encounter  Medications   hydrochlorothiazide (HYDRODIURIL) 25 MG tablet    Sig: Take 1 tablet (25 mg total) by mouth daily.    Dispense:  30  tablet    Refill:  5   lisinopril (ZESTRIL) 2.5 MG tablet    Sig: Take 1 tablet (2.5 mg total) by mouth daily.    Dispense:  30 tablet    Refill:  5   metFORMIN (GLUCOPHAGE) 500 MG tablet    Sig: Take 1 tablet (500 mg total) by mouth daily with breakfast.    Dispense:  30 tablet    Refill:  5   metoprolol succinate (TOPROL-XL) 100 MG 24 hr tablet    Sig: TAKE 1 TABLET BY MOUTH ONCE DAILY WITH OR IMMEDIATELY FOLLOWING A MEAL    Dispense:  30 tablet    Refill:  5   metoprolol succinate (TOPROL-XL) 25 MG 24 hr tablet    Sig: Take 1 tablet (25 mg total) by mouth daily.    Dispense:  30 tablet    Refill:  5    Add on to 100 mg tab   omeprazole (PRILOSEC) 40 MG capsule    Sig: Take 1 capsule (40 mg total) by mouth at bedtime.    Dispense:  30 capsule    Refill:  5   rosuvastatin (CRESTOR) 10 MG tablet    Sig: Take 1 tablet (10 mg total) by mouth daily.    Dispense:  30 tablet    Refill:  11   fenofibrate (TRICOR) 145 MG tablet    Sig: Take 1 tablet (145 mg total) by mouth daily.    Dispense:  30 tablet    Refill:  11     Referral Orders  No referral(s) requested today    Howard Pouch, DO 08/21/2021

## 2021-08-21 NOTE — Patient Instructions (Signed)
Great to see you today.  I have refilled the medication(s) we provide.   If labs were collected, we will inform you of lab results once received either by echart message or telephone call.   - echart message- for normal results that have been seen by the patient already.   - telephone call: abnormal results or if patient has not viewed results in their echart.   Diabetes Mellitus and Foot Care Foot care is an important part of your health, especially when you have diabetes. Diabetes may cause you to have problems because of poor blood flow (circulation) to your feet and legs, which can cause your skin to: Become thinner and drier. Break more easily. Heal more slowly. Peel and crack. You may also have nerve damage (neuropathy) in your legs and feet, causing decreased feeling in them. This means that you may not notice minor injuries to your feet that could lead to more serious problems. Noticing and addressing any potential problems early is the best wayto prevent future foot problems. How to care for your feet Foot hygiene  Wash your feet daily with warm water and mild soap. Do not use hot water. Then, pat your feet and the areas between your toes until they are completely dry. Do not soak your feet as this can dry your skin. Trim your toenails straight across. Do not dig under them or around the cuticle. File the edges of your nails with an emery board or nail file. Apply a moisturizing lotion or petroleum jelly to the skin on your feet and to dry, brittle toenails. Use lotion that does not contain alcohol and is unscented. Do not apply lotion between your toes.  Shoes and socks Wear clean socks or stockings every day. Make sure they are not too tight. Do not wear knee-high stockings since they may decrease blood flow to your legs. Wear shoes that fit properly and have enough cushioning. Always look in your shoes before you put them on to be sure there are no objects inside. To break in new  shoes, wear them for just a few hours a day. This prevents injuries on your feet. Wounds, scrapes, corns, and calluses  Check your feet daily for blisters, cuts, bruises, sores, and redness. If you cannot see the bottom of your feet, use a mirror or ask someone for help. Do not cut corns or calluses or try to remove them with medicine. If you find a minor scrape, cut, or break in the skin on your feet, keep it and the skin around it clean and dry. You may clean these areas with mild soap and water. Do not clean the area with peroxide, alcohol, or iodine. If you have a wound, scrape, corn, or callus on your foot, look at it several times a day to make sure it is healing and not infected. Check for: Redness, swelling, or pain. Fluid or blood. Warmth. Pus or a bad smell.  General tips Do not cross your legs. This may decrease blood flow to your feet. Do not use heating pads or hot water bottles on your feet. They may burn your skin. If you have lost feeling in your feet or legs, you may not know this is happening until it is too late. Protect your feet from hot and cold by wearing shoes, such as at the beach or on hot pavement. Schedule a complete foot exam at least once a year (annually) or more often if you have foot problems. Report any cuts,  sores, or bruises to your health care provider immediately. Where to find more information American Diabetes Association: www.diabetes.org Association of Diabetes Care & Education Specialists: www.diabeteseducator.org Contact a health care provider if: You have a medical condition that increases your risk of infection and you have any cuts, sores, or bruises on your feet. You have an injury that is not healing. You have redness on your legs or feet. You feel burning or tingling in your legs or feet. You have pain or cramps in your legs and feet. Your legs or feet are numb. Your feet always feel cold. You have pain around any toenails. Get help right  away if: You have a wound, scrape, corn, or callus on your foot and: You have pain, swelling, or redness that gets worse. You have fluid or blood coming from the wound, scrape, corn, or callus. Your wound, scrape, corn, or callus feels warm to the touch. You have pus or a bad smell coming from the wound, scrape, corn, or callus. You have a fever. You have a red line going up your leg. Summary Check your feet every day for blisters, cuts, bruises, sores, and redness. Apply a moisturizing lotion or petroleum jelly to the skin on your feet and to dry, brittle toenails. Wear shoes that fit properly and have enough cushioning. If you have foot problems, report any cuts, sores, or bruises to your health care provider immediately. Schedule a complete foot exam at least once a year (annually) or more often if you have foot problems. This information is not intended to replace advice given to you by your health care provider. Make sure you discuss any questions you have with your healthcare provider. Document Revised: 06/29/2020 Document Reviewed: 06/29/2020 Elsevier Patient Education  2022 [employer].

## 2021-08-22 LAB — HEPATIC FUNCTION PANEL
ALT: 21 U/L (ref 0–35)
AST: 23 U/L (ref 0–37)
Albumin: 4.4 g/dL (ref 3.5–5.2)
Alkaline Phosphatase: 53 U/L (ref 39–117)
Bilirubin, Direct: 0.1 mg/dL (ref 0.0–0.3)
Total Bilirubin: 0.5 mg/dL (ref 0.2–1.2)
Total Protein: 6.9 g/dL (ref 6.0–8.3)

## 2021-08-22 LAB — LDL CHOLESTEROL, DIRECT: Direct LDL: 70 mg/dL

## 2022-01-01 ENCOUNTER — Ambulatory Visit: Payer: BC Managed Care – PPO | Admitting: Family Medicine

## 2022-01-22 ENCOUNTER — Other Ambulatory Visit: Payer: Self-pay

## 2022-01-23 ENCOUNTER — Encounter: Payer: Self-pay | Admitting: Family Medicine

## 2022-01-23 ENCOUNTER — Ambulatory Visit: Payer: BC Managed Care – PPO | Admitting: Family Medicine

## 2022-01-23 VITALS — BP 124/76 | HR 68 | Temp 97.9°F | Wt 171.0 lb

## 2022-01-23 DIAGNOSIS — Z5320 Procedure and treatment not carried out because of patient's decision for unspecified reasons: Secondary | ICD-10-CM | POA: Diagnosis not present

## 2022-01-23 DIAGNOSIS — E1169 Type 2 diabetes mellitus with other specified complication: Secondary | ICD-10-CM | POA: Diagnosis not present

## 2022-01-23 DIAGNOSIS — Z720 Tobacco use: Secondary | ICD-10-CM

## 2022-01-23 DIAGNOSIS — R002 Palpitations: Secondary | ICD-10-CM | POA: Diagnosis not present

## 2022-01-23 DIAGNOSIS — E785 Hyperlipidemia, unspecified: Secondary | ICD-10-CM | POA: Diagnosis not present

## 2022-01-23 DIAGNOSIS — I10 Essential (primary) hypertension: Secondary | ICD-10-CM

## 2022-01-23 DIAGNOSIS — E559 Vitamin D deficiency, unspecified: Secondary | ICD-10-CM

## 2022-01-23 DIAGNOSIS — Z6832 Body mass index (BMI) 32.0-32.9, adult: Secondary | ICD-10-CM

## 2022-01-23 DIAGNOSIS — I77811 Abdominal aortic ectasia: Secondary | ICD-10-CM

## 2022-01-23 LAB — CBC
HCT: 45.6 % (ref 36.0–46.0)
Hemoglobin: 15.1 g/dL — ABNORMAL HIGH (ref 12.0–15.0)
MCHC: 33.2 g/dL (ref 30.0–36.0)
MCV: 91.4 fl (ref 78.0–100.0)
Platelets: 260 10*3/uL (ref 150.0–400.0)
RBC: 4.99 Mil/uL (ref 3.87–5.11)
RDW: 12.7 % (ref 11.5–15.5)
WBC: 7.6 10*3/uL (ref 4.0–10.5)

## 2022-01-23 LAB — COMPREHENSIVE METABOLIC PANEL
ALT: 17 U/L (ref 0–35)
AST: 22 U/L (ref 0–37)
Albumin: 4.4 g/dL (ref 3.5–5.2)
Alkaline Phosphatase: 51 U/L (ref 39–117)
BUN: 15 mg/dL (ref 6–23)
CO2: 29 mEq/L (ref 19–32)
Calcium: 9.9 mg/dL (ref 8.4–10.5)
Chloride: 104 mEq/L (ref 96–112)
Creatinine, Ser: 0.6 mg/dL (ref 0.40–1.20)
GFR: 100.52 mL/min (ref >60.00)
Glucose, Bld: 99 mg/dL (ref 70–99)
Potassium: 4 mEq/L (ref 3.5–5.1)
Sodium: 142 mEq/L (ref 135–145)
Total Bilirubin: 0.7 mg/dL (ref 0.2–1.2)
Total Protein: 7 g/dL (ref 6.0–8.3)

## 2022-01-23 LAB — POCT GLYCOSYLATED HEMOGLOBIN (HGB A1C)
HbA1c POC (<> result, manual entry): 5.5 % (ref 4.0–5.6)
HbA1c, POC (controlled diabetic range): 5.5 % (ref 0.0–7.0)
HbA1c, POC (prediabetic range): 5.5 % — AB (ref 5.7–6.4)
Hemoglobin A1C: 5.5 % (ref 4.0–5.6)

## 2022-01-23 LAB — MICROALBUMIN / CREATININE URINE RATIO
Creatinine,U: 87.2 mg/dL
Microalb Creat Ratio: 1.1 mg/g (ref 0.0–30.0)
Microalb, Ur: 1 mg/dL (ref 0.0–1.9)

## 2022-01-23 LAB — TSH: TSH: 0.71 u[IU]/mL (ref 0.35–5.50)

## 2022-01-23 MED ORDER — METFORMIN HCL 500 MG PO TABS: 500.0000 mg | ORAL_TABLET | Freq: Every day | ORAL | 5 refills | Status: DC

## 2022-01-23 MED ORDER — OMEPRAZOLE 40 MG PO CPDR: 40.0000 mg | DELAYED_RELEASE_CAPSULE | Freq: Every day | ORAL | 5 refills | Status: DC

## 2022-01-23 MED ORDER — METOPROLOL SUCCINATE ER 25 MG PO TB24: 25.0000 mg | ORAL_TABLET | Freq: Every day | ORAL | 5 refills | Status: DC

## 2022-01-23 MED ORDER — METOPROLOL SUCCINATE ER 100 MG PO TB24: ORAL_TABLET | ORAL | 5 refills | Status: DC

## 2022-01-23 MED ORDER — LISINOPRIL 2.5 MG PO TABS: 2.5000 mg | ORAL_TABLET | Freq: Every day | ORAL | 5 refills | Status: DC

## 2022-01-23 MED ORDER — HYDROCHLOROTHIAZIDE 25 MG PO TABS: 25.0000 mg | ORAL_TABLET | Freq: Every day | ORAL | 5 refills | Status: DC

## 2022-01-23 NOTE — Progress Notes (Signed)
SUBJECTIVE Chief Complaint  Patient presents with   Follow-up    4 month cmc    HPI: Teresa Calderon is a 56 y.o. female present for [facility] follow up type 2 diabetes mellitus ([place]) Diagnosed 10/26/2018 with a A1c 13.9.    Reports compliance  with metformin 500 mg daily.  Januvia had been discontinued last visit secondary to very well controlled diabetes and metformin decreased. Patient denies dizziness, hyperglycemic or hypoglycemic events. Patient denies numbness, tingling in the extremities or nonhealing wounds of feet.   Essential hypertension, benign/. Morbid obesity (HCC)/Ectatic abdominal aorta (HCC)/Elevated LFTs/Hepatic steatosis/Hyperlipidemia LDL goal <130/statin declined Patient reports compliance with metoprolol 125 mg daily, lisinopril 2.5 mg and HCTZ 25 mg daily.  SHe is taking the fenofibrate  and crestor. Patient denies chest pain, shortness of breath, dizziness or lower extremity edema. Pt declined statin She does still have palpitations-without symptoms.  RF: Hypertension, hyperlipidemia, Fhx coronary artery disease  ROS: See pertinent positives and negatives per HPI.  Patient Active Problem List   Diagnosis Date Noted   Lung cancer screening declined by patient 01/23/2022   BMI 32.0-32.9,adult 08/21/2021   Statin declined 02/10/2019   Ectatic abdominal aorta ([place]) 11/03/2018   Type 2 diabetes mellitus with hyperlipidemia ([place]) 10/28/2018   Leukocytosis 10/28/2018   Elevated LFTs 10/28/2018   Tobacco abuse 10/26/2018   Morbid obesity ([place]) 04/11/2017   Vitamin D deficiency 01/31/2015   Essential hypertension, benign 03/08/2014   Palpitations 01/20/2012    Social History   Tobacco Use   Smoking status: Every Day    Packs/day: 1.00    Years: 30.00    Pack years: 30.00    Types: Cigarettes   Smokeless tobacco: Never  Substance Use Topics   Alcohol use: No    Alcohol/week: 0.0 standard drinks    Current Outpatient Medications:    Accu-Chek FastClix  Lancets MISC, Monitor fasting blood sugars daily., Disp: 99 each, Rfl: 11   blood glucose meter kit and supplies KIT, Dispense based on patient and insurance preference. Use up to two times daily as directed. ., Disp: 1 each, Rfl: 0   Cholecalciferol 50 MCG (2000 UT) TABS, Take 2,000 Units by mouth daily., Disp: , Rfl:    fenofibrate (TRICOR) 145 MG tablet, Take 1 tablet (145 mg total) by mouth daily., Disp: 30 tablet, Rfl: 11   glucose blood (ACCU-CHEK GUIDE) test strip, Use as instructed, Disp: 100 each, Rfl: 12   hydrochlorothiazide (HYDRODIURIL) 25 MG tablet, Take 1 tablet (25 mg total) by mouth daily., Disp: 30 tablet, Rfl: 5   lisinopril (ZESTRIL) 2.5 MG tablet, Take 1 tablet (2.5 mg total) by mouth daily., Disp: 30 tablet, Rfl: 5   metFORMIN (GLUCOPHAGE) 500 MG tablet, Take 1 tablet (500 mg total) by mouth daily with breakfast., Disp: 30 tablet, Rfl: 5   metoprolol succinate (TOPROL-XL) 100 MG 24 hr tablet, TAKE 1 TABLET BY MOUTH ONCE DAILY WITH OR IMMEDIATELY FOLLOWING A MEAL, Disp: 30 tablet, Rfl: 5   metoprolol succinate (TOPROL-XL) 25 MG 24 hr tablet, Take 1 tablet (25 mg total) by mouth daily., Disp: 30 tablet, Rfl: 5   Multiple Vitamin (MULTIVITAMIN) tablet, Take 1 tablet by mouth daily., Disp: , Rfl:    Omega-3 Fatty Acids (FISH OIL) 1000 MG CAPS, Take 1 capsule by mouth daily., Disp: , Rfl:    omeprazole (PRILOSEC) 40 MG capsule, Take 1 capsule (40 mg total) by mouth at bedtime., Disp: 30 capsule, Rfl: 5   rosuvastatin (CRESTOR) 10 MG  tablet, Take 1 tablet (10 mg total) by mouth daily., Disp: 30 tablet, Rfl: 11  No Known Allergies  OBJECTIVE: BP 124/76    Pulse 68    Temp 97.9 F (36.6 C)    Wt 171 lb (77.6 kg)    LMP 04/17/2015    SpO2 97%    BMI 31.28 kg/m  Physical Exam Vitals and nursing note reviewed.  Constitutional:      General: She is not in acute distress.    Appearance: Normal appearance. She is obese. She is not ill-appearing, toxic-appearing or diaphoretic.   HENT:     Head: Normocephalic and atraumatic.     Mouth/Throat:     Mouth: Mucous membranes are moist.  Eyes:     General: No scleral icterus.       Right eye: No discharge.        Left eye: No discharge.     Extraocular Movements: Extraocular movements intact.     Conjunctiva/sclera: Conjunctivae normal.     Pupils: Pupils are equal, round, and reactive to light.  Cardiovascular:     Rate and Rhythm: Normal rate and regular rhythm.  Pulmonary:     Effort: Pulmonary effort is normal. No respiratory distress.     Breath sounds: Normal breath sounds. No wheezing, rhonchi or rales.  Musculoskeletal:     Cervical back: Neck supple. No tenderness.  Lymphadenopathy:     Cervical: No cervical adenopathy.  Skin:    General: Skin is warm and dry.     Coloration: Skin is not jaundiced or pale.     Findings: No erythema or rash.  Neurological:     Mental Status: She is alert and oriented to person, place, and time. Mental status is at baseline.     Motor: No weakness.     Gait: Gait normal.  Psychiatric:        Mood and Affect: Mood normal.        Behavior: Behavior normal.        Thought Content: Thought content normal.        Judgment: Judgment normal.    Diabetic Foot Exam - Simple   Simple Foot Form Diabetic Foot exam was performed with the following findings: Yes 01/23/2022  9:07 AM  Visual Inspection No deformities, no ulcerations, no other skin breakdown bilaterally: Yes Sensation Testing Pulse Check Posterior Tibialis and Dorsalis pulse intact bilaterally: Yes Comments     Results for orders placed or performed in visit on 01/23/22 (from the past 24 hour(s))  POCT glycosylated hemoglobin (Hb A1C)     Status: Abnormal   Collection Time: 01/23/22  9:04 AM  Result Value Ref Range   Hemoglobin A1C 5.5 4.0 - 5.6 %   HbA1c POC (<> result, manual entry) 5.5 4.0 - 5.6 %   HbA1c, POC (prediabetic range) 5.5 (A) 5.7 - 6.4 %   HbA1c, POC (controlled diabetic range) 5.5 0.0 -  7.0 %      ASSESSMENT AND PLAN: Teresa Calderon is a 56 y.o. female present for [facility] type 2 diabetes mellitus w/ hyperlipidemia(HCC) Stable.  -Discontinued Javuvia for great control.  - continue Metformin 500 mg daily.  - continue crestor PNA series:  PNA23 05/2019 UTD  Flu shot: UTD(recommneded yearly) Foot exam: completed 08/21/2021 Eye exam: my eye doc in [facility] 11/02/2020 - encouraged her to schedule Microalbumin : +> started ace> microalbumin ordered today A1c: 13.9 (10/26/2018)--> 5.4 >> 5.3> 5.0>5.1>5.4> 5.5> 5.5 today !! F/u 5.5 months  - POCT  glycosylated hemoglobin (Hb A1C) - Urine Microalbumin w/creat. ratio Essential hypertension, benign/. Morbid obesity (HCC)/Ectatic abdominal aorta (HCC)/Elevated LFTs/Hepatic steatosis/Hyperlipidemia LDL goal <70/Palpitations- STATIN DECLINED - stable.  - continue  lisinopril 2.5 mg qd - continue metoprolol 125 mg daily  - continue   HCTZ to  74m daily (potassium low with higher doses) - continue  fenofibrate.   - continue crestor 10 mg qd  ABD [place]: Maximum aortic diameter of 2.6 cm. Ectatic abdominal aorta at risk for aneurysm development. Recommend followup by ultrasound in 5 years. DUE 10/2023 - f/u 5.5 mos - Comp Met (CMET) - CBC - TSH   Lung cancer screening declined by patient Declined- pt counseled.   Tobacco abuse Declined cessation assistance.    Orders Placed This Encounter  Procedures   Urine Microalbumin w/creat. ratio   Comp Met (CMET)   CBC   TSH   POCT glycosylated hemoglobin (Hb A1C)   Meds ordered this encounter  Medications   omeprazole (PRILOSEC) 40 MG capsule    Sig: Take 1 capsule (40 mg total) by mouth at bedtime.    Dispense:  30 capsule    Refill:  5   metoprolol succinate (TOPROL-XL) 25 MG 24 hr tablet    Sig: Take 1 tablet (25 mg total) by mouth daily.    Dispense:  30 tablet    Refill:  5    Add on to 100 mg tab   metoprolol succinate (TOPROL-XL) 100 MG 24 hr tablet    Sig: TAKE  1 TABLET BY MOUTH ONCE DAILY WITH OR IMMEDIATELY FOLLOWING A MEAL    Dispense:  30 tablet    Refill:  5   metFORMIN (GLUCOPHAGE) 500 MG tablet    Sig: Take 1 tablet (500 mg total) by mouth daily with breakfast.    Dispense:  30 tablet    Refill:  5   lisinopril (ZESTRIL) 2.5 MG tablet    Sig: Take 1 tablet (2.5 mg total) by mouth daily.    Dispense:  30 tablet    Refill:  5   hydrochlorothiazide (HYDRODIURIL) 25 MG tablet    Sig: Take 1 tablet (25 mg total) by mouth daily.    Dispense:  30 tablet    Refill:  5    Referral Orders  No referral(s) requested today    RHoward Pouch DO 01/23/2022

## 2022-01-23 NOTE — Patient Instructions (Signed)
Great to see you today.  I have refilled the medication(s) we provide.   If labs were collected, we will inform you of lab results once received either by echart message or telephone call.   - echart message- for normal results that have been seen by the patient already.   - telephone call: abnormal results or if patient has not viewed results in their echart.  

## 2022-04-02 ENCOUNTER — Ambulatory Visit: Payer: BC Managed Care – PPO | Admitting: Family Medicine

## 2022-04-02 ENCOUNTER — Encounter: Payer: Self-pay | Admitting: Family Medicine

## 2022-04-02 ENCOUNTER — Telehealth: Payer: Self-pay

## 2022-04-02 NOTE — Telephone Encounter (Signed)
FYI

## 2022-04-02 NOTE — Telephone Encounter (Signed)
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 Patient did not reschedule appt. ? ?Appt notes: "pt wanting to discuss her heart complications" ?

## 2022-04-02 NOTE — Telephone Encounter (Signed)
2nd no show (03/29/20, 04/02/22), $50 fee charged for 04/02/22, letter being mailed, pt has rescheduled ?

## 2022-04-29 IMAGING — US US EXTREM LOW VENOUS*R*
1 series · 14 of 24 positions shown · non-contrast
Comparison: No prior.

CLINICAL DATA: Right lower extremity pain.

EXAM:
RIGHT LOWER EXTREMITY VENOUS DOPPLER ULTRASOUND
TECHNIQUE: Gray-scale sonography with compression, as well as color and duplex
ultrasound, were performed to evaluate the deep venous system(s)
from the level of the common femoral vein through the popliteal and
proximal calf veins.

[Series 1: us extrem low venous*right* · 0.08mm/px · 14 of 40 slices shown]
[im 1/40]
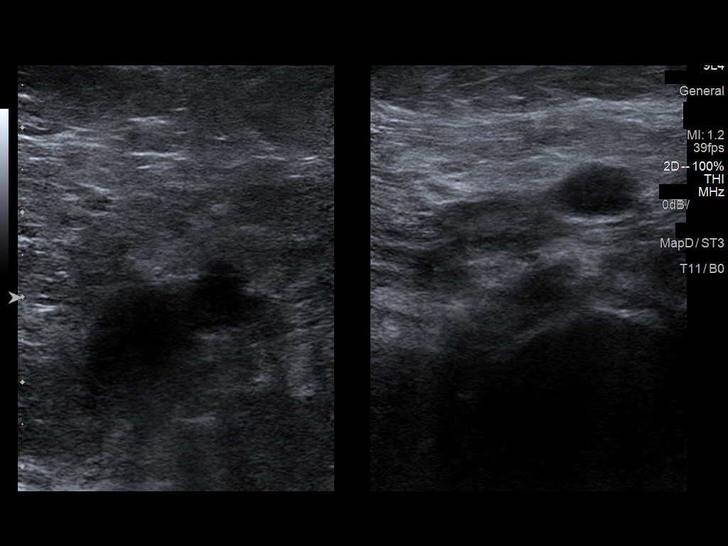
[im 4/40]
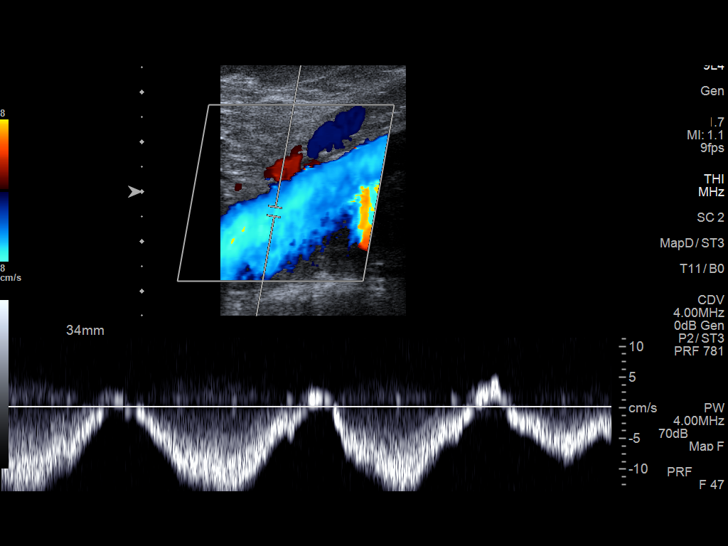
[im 7/40]
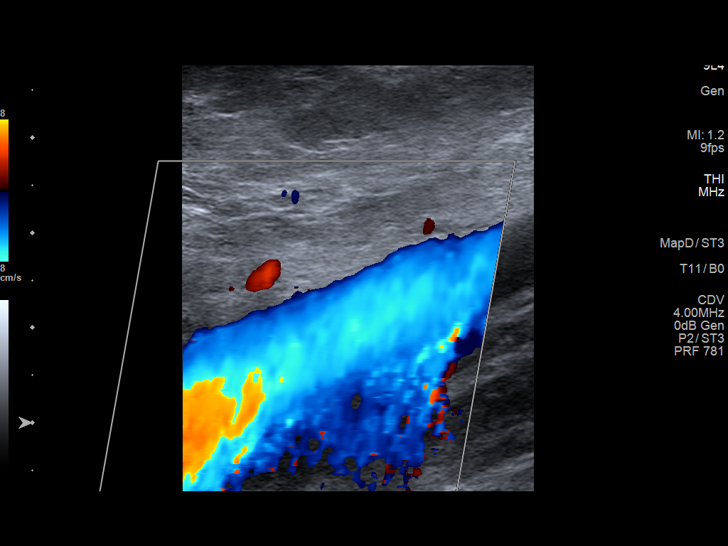
[im 11/40]
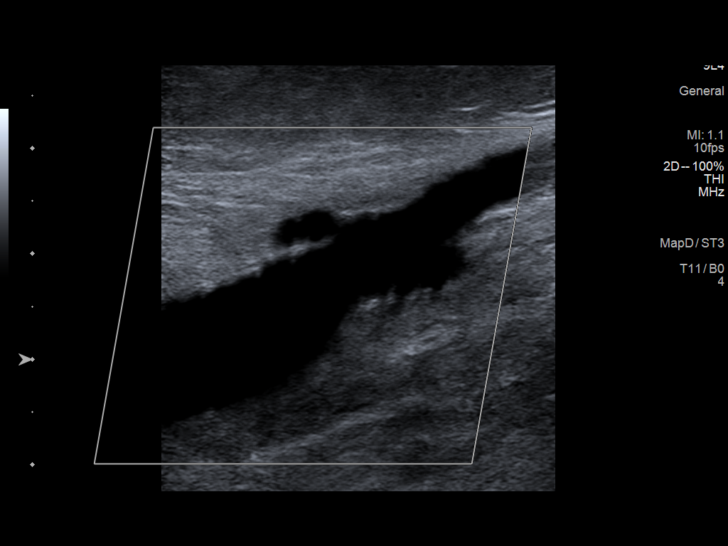
[im 12/40]
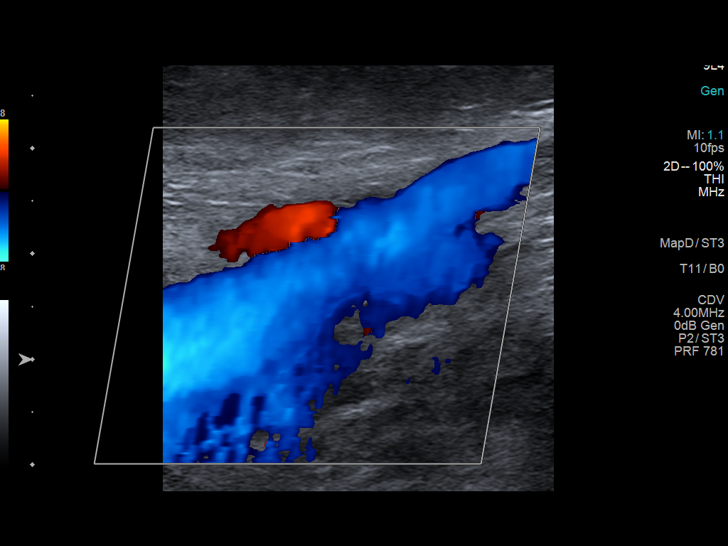
[im 16/40]
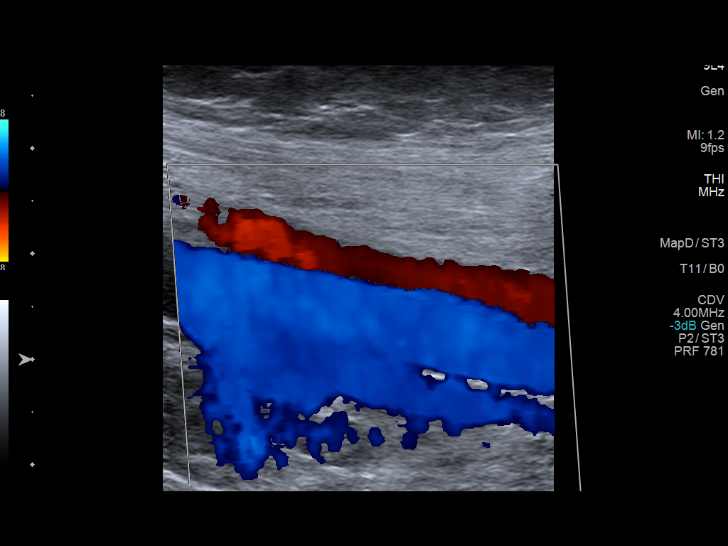
[im 19/40]
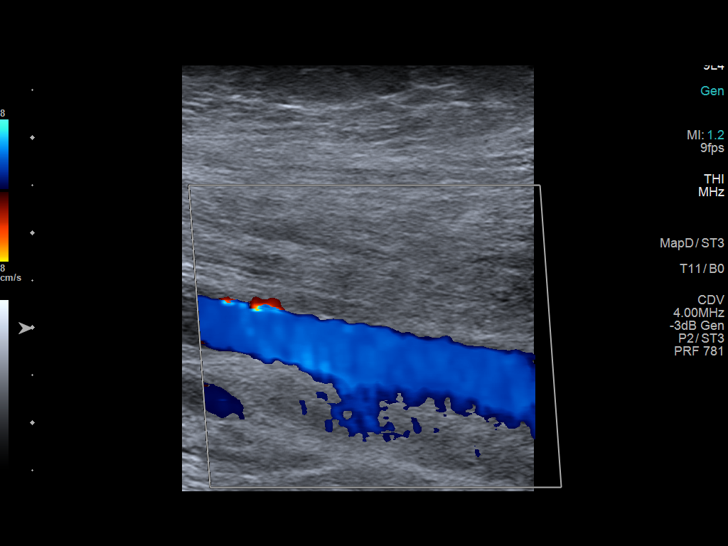
[im 21/40]
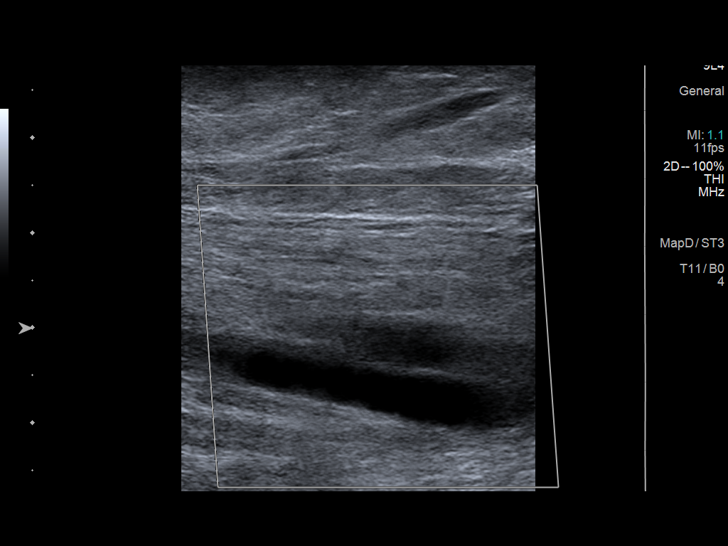
[im 24/40]
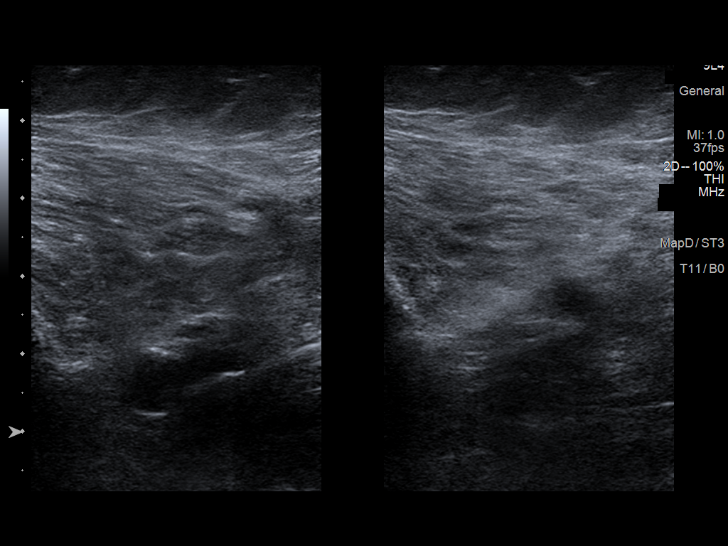
[im 28/40]
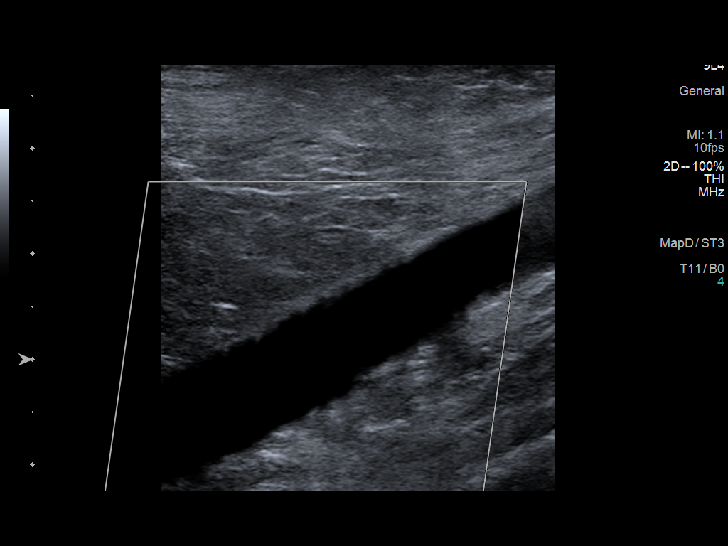
[im 31/40]
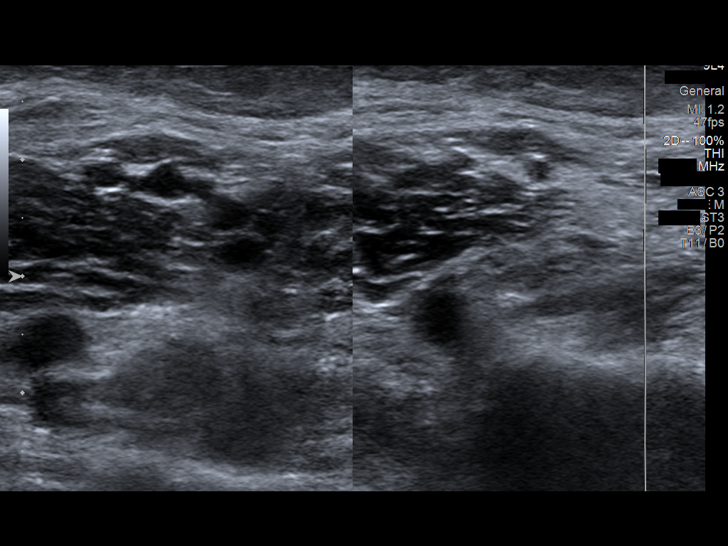
[im 33/40]
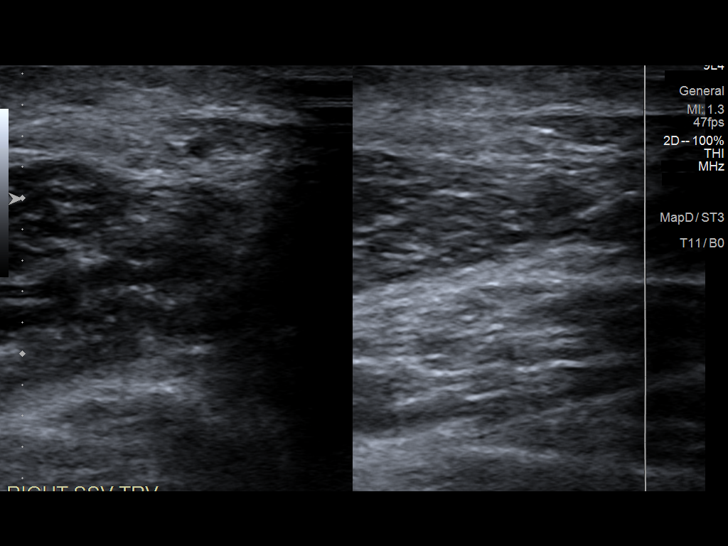
[im 36/40]
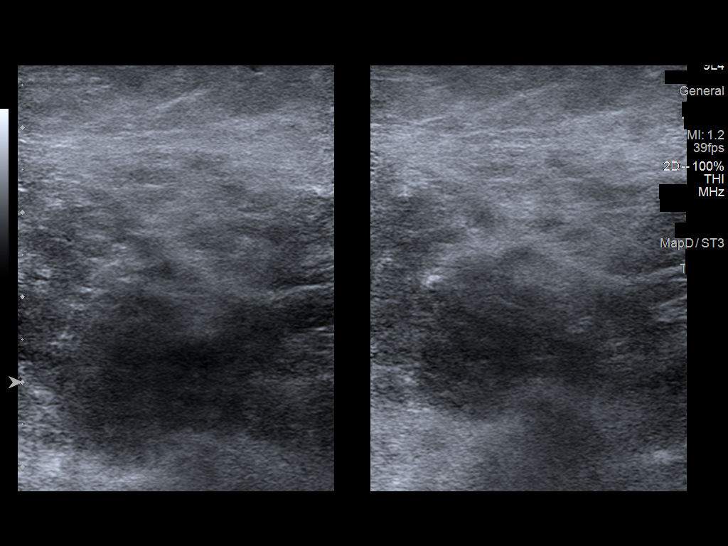
[im 40/40]
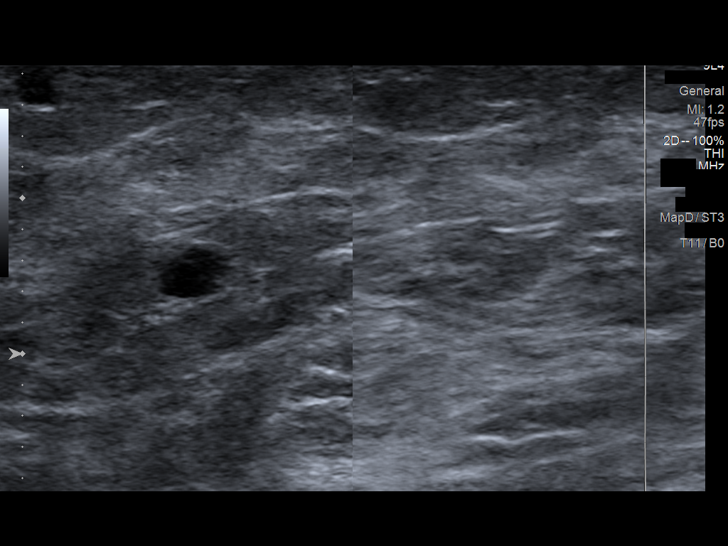

[14 of 24 positions shown; findings below may reference images not displayed]

FINDINGS: VENOUS

Normal compressibility of the common femoral, superficial femoral,
and popliteal veins, as well as the visualized calf veins.
Visualized portions of profunda femoral vein and great saphenous
vein unremarkable. No filling defects to suggest DVT on grayscale or
color Doppler imaging. Doppler waveforms show normal direction of
venous flow, normal respiratory plasticity and response to
augmentation.

Limited views of the contralateral common femoral vein are
unremarkable.

OTHER

None.
IMPRESSION: Negative.

## 2022-07-10 ENCOUNTER — Ambulatory Visit: Payer: BC Managed Care – PPO | Admitting: Family Medicine

## 2022-07-10 ENCOUNTER — Encounter: Payer: Self-pay | Admitting: Family Medicine

## 2022-07-10 VITALS — BP 134/80 | HR 68 | Temp 97.9°F | Ht 62.0 in | Wt 171.0 lb

## 2022-07-10 DIAGNOSIS — I10 Essential (primary) hypertension: Secondary | ICD-10-CM

## 2022-07-10 DIAGNOSIS — E1169 Type 2 diabetes mellitus with other specified complication: Secondary | ICD-10-CM | POA: Diagnosis not present

## 2022-07-10 DIAGNOSIS — I77811 Abdominal aortic ectasia: Secondary | ICD-10-CM

## 2022-07-10 DIAGNOSIS — E559 Vitamin D deficiency, unspecified: Secondary | ICD-10-CM

## 2022-07-10 DIAGNOSIS — E785 Hyperlipidemia, unspecified: Secondary | ICD-10-CM | POA: Diagnosis not present

## 2022-07-10 DIAGNOSIS — Z5320 Procedure and treatment not carried out because of patient's decision for unspecified reasons: Secondary | ICD-10-CM

## 2022-07-10 LAB — POCT GLYCOSYLATED HEMOGLOBIN (HGB A1C)
HbA1c POC (<> result, manual entry): 5.6 % (ref 4.0–5.6)
HbA1c, POC (controlled diabetic range): 5.6 % (ref 0.0–7.0)
HbA1c, POC (prediabetic range): 5.6 % — AB (ref 5.7–6.4)
Hemoglobin A1C: 5.6 % (ref 4.0–5.6)

## 2022-07-10 LAB — COMPREHENSIVE METABOLIC PANEL
ALT: 19 U/L (ref 0–35)
AST: 21 U/L (ref 0–37)
Albumin: 4.7 g/dL (ref 3.5–5.2)
Alkaline Phosphatase: 52 U/L (ref 39–117)
BUN: 24 mg/dL — ABNORMAL HIGH (ref 6–23)
CO2: 28 mEq/L (ref 19–32)
Calcium: 9.9 mg/dL (ref 8.4–10.5)
Chloride: 102 mEq/L (ref 96–112)
Creatinine, Ser: 0.58 mg/dL (ref 0.40–1.20)
GFR: 101.02 mL/min (ref >60.00)
Glucose, Bld: 108 mg/dL — ABNORMAL HIGH (ref 70–99)
Potassium: 4.1 mEq/L (ref 3.5–5.1)
Sodium: 138 mEq/L (ref 135–145)
Total Bilirubin: 0.6 mg/dL (ref 0.2–1.2)
Total Protein: 7.1 g/dL (ref 6.0–8.3)

## 2022-07-10 LAB — LIPID PANEL
Cholesterol: 123 mg/dL (ref 0–200)
HDL: 58.6 mg/dL (ref >39.00)
LDL Cholesterol: 52 mg/dL (ref 0–99)
NonHDL: 64.78
Total CHOL/HDL Ratio: 2
Triglycerides: 62 mg/dL (ref 0.0–149.0)
VLDL: 12.4 mg/dL (ref 0.0–40.0)

## 2022-07-10 LAB — VITAMIN D 25 HYDROXY (VIT D DEFICIENCY, FRACTURES): VITD: 64.57 ng/mL (ref 30.00–100.00)

## 2022-07-10 MED ORDER — OMEPRAZOLE 40 MG PO CPDR: 40.0000 mg | DELAYED_RELEASE_CAPSULE | Freq: Every day | ORAL | 5 refills | Status: DC

## 2022-07-10 MED ORDER — LISINOPRIL 5 MG PO TABS: 5.0000 mg | ORAL_TABLET | Freq: Every day | ORAL | 5 refills | Status: DC

## 2022-07-10 MED ORDER — HYDROCHLOROTHIAZIDE 25 MG PO TABS: 25.0000 mg | ORAL_TABLET | Freq: Every day | ORAL | 5 refills | Status: DC

## 2022-07-10 MED ORDER — METFORMIN HCL 500 MG PO TABS: 500.0000 mg | ORAL_TABLET | Freq: Every day | ORAL | 5 refills | Status: DC

## 2022-07-10 MED ORDER — FENOFIBRATE 145 MG PO TABS: 145.0000 mg | ORAL_TABLET | Freq: Every day | ORAL | 11 refills | Status: DC

## 2022-07-10 MED ORDER — METOPROLOL SUCCINATE ER 25 MG PO TB24: 25.0000 mg | ORAL_TABLET | Freq: Every day | ORAL | 5 refills | Status: DC

## 2022-07-10 MED ORDER — METOPROLOL SUCCINATE ER 100 MG PO TB24: ORAL_TABLET | ORAL | 5 refills | Status: DC

## 2022-07-10 MED ORDER — ROSUVASTATIN CALCIUM 10 MG PO TABS
10.0000 mg | ORAL_TABLET | Freq: Every day | ORAL | 11 refills | Status: DC
Start: 2022-07-10 — End: 2023-04-14

## 2022-07-10 NOTE — Progress Notes (Signed)
SUBJECTIVE Chief Complaint  Patient presents with   Diabetes    [facility]; pt is fasting    HPI: Teresa Calderon is a 56 y.o. female present for [facility] follow up type 2 diabetes mellitus ([place]) Diagnosed 10/26/2018 with a A1c 13.9.    Reports compliance with metformin 500 mg daily.  Januvia had been recently discontinued secondary to very well controlled diabetes and metformin decreased. Patient denies dizziness, hyperglycemic or hypoglycemic events. Patient denies numbness, tingling in the extremities or nonhealing wounds of feet.   Essential hypertension, benign/. Morbid obesity (HCC)/Ectatic abdominal aorta (HCC)/Elevated LFTs/Hepatic steatosis/Hyperlipidemia LDL goal <130/statin declined Patient reports compliance with metoprolol 125 mg daily, lisinopril 2.5 mg and HCTZ 25 mg daily.  SHe is taking the fenofibrate  and crestor. Patient denies chest pain, shortness of breath, dizziness or lower extremity edema.  She does still have palpitations-without symptoms.  RF: Hypertension, hyperlipidemia, Fhx coronary artery disease  ROS: See pertinent positives and negatives per HPI.  Patient Active Problem List   Diagnosis Date Noted   Lung cancer screening declined by patient 01/23/2022   BMI 32.0-32.9,adult 08/21/2021   Ectatic abdominal aorta ([place]) 11/03/2018   Type 2 diabetes mellitus with hyperlipidemia ([place]) 10/28/2018   Leukocytosis 10/28/2018   Elevated LFTs 10/28/2018   Tobacco abuse 10/26/2018   Morbid obesity ([place]) 04/11/2017   Vitamin D deficiency 01/31/2015   Essential hypertension, benign 03/08/2014   Palpitations 01/20/2012    Social History   Tobacco Use   Smoking status: Every Day    Packs/day: 1.00    Years: 30.00    Total pack years: 30.00    Types: Cigarettes   Smokeless tobacco: Never  Substance Use Topics   Alcohol use: No    Alcohol/week: 0.0 standard drinks of alcohol    Current Outpatient Medications:    Accu-Chek FastClix Lancets MISC, Monitor fasting  blood sugars daily., Disp: 99 each, Rfl: 11   blood glucose meter kit and supplies KIT, Dispense based on patient and insurance preference. Use up to two times daily as directed. ., Disp: 1 each, Rfl: 0   Cholecalciferol 50 MCG (2000 UT) TABS, Take 2,000 Units by mouth daily., Disp: , Rfl:    glucose blood (ACCU-CHEK GUIDE) test strip, Use as instructed, Disp: 100 each, Rfl: 12   Multiple Vitamin (MULTIVITAMIN) tablet, Take 1 tablet by mouth daily., Disp: , Rfl:    Omega-3 Fatty Acids (FISH OIL) 1000 MG CAPS, Take 1 capsule by mouth daily., Disp: , Rfl:    fenofibrate (TRICOR) 145 MG tablet, Take 1 tablet (145 mg total) by mouth daily., Disp: 30 tablet, Rfl: 11   hydrochlorothiazide (HYDRODIURIL) 25 MG tablet, Take 1 tablet (25 mg total) by mouth daily., Disp: 30 tablet, Rfl: 5   lisinopril (ZESTRIL) 5 MG tablet, Take 1 tablet (5 mg total) by mouth daily., Disp: 30 tablet, Rfl: 5   metFORMIN (GLUCOPHAGE) 500 MG tablet, Take 1 tablet (500 mg total) by mouth daily with breakfast., Disp: 30 tablet, Rfl: 5   metoprolol succinate (TOPROL-XL) 100 MG 24 hr tablet, TAKE 1 TABLET BY MOUTH ONCE DAILY WITH OR IMMEDIATELY FOLLOWING A MEAL, Disp: 30 tablet, Rfl: 5   metoprolol succinate (TOPROL-XL) 25 MG 24 hr tablet, Take 1 tablet (25 mg total) by mouth daily., Disp: 30 tablet, Rfl: 5   omeprazole (PRILOSEC) 40 MG capsule, Take 1 capsule (40 mg total) by mouth at bedtime., Disp: 30 capsule, Rfl: 5   rosuvastatin (CRESTOR) 10 MG tablet, Take 1 tablet (10  mg total) by mouth daily., Disp: 30 tablet, Rfl: 11  No Known Allergies  OBJECTIVE: BP 134/80   Pulse 68   Temp 97.9 F (36.6 C) (Oral)   Ht '5\' 2"'  (1.575 m)   Wt 171 lb (77.6 kg)   LMP 04/17/2015   SpO2 99%   BMI 31.28 kg/m  Physical Exam Vitals and nursing note reviewed.  Constitutional:      General: She is not in acute distress.    Appearance: Normal appearance. She is not ill-appearing, toxic-appearing or diaphoretic.  HENT:     Head:  Normocephalic and atraumatic.     Mouth/Throat:     Mouth: Mucous membranes are moist.  Eyes:     General: No scleral icterus.       Right eye: No discharge.        Left eye: No discharge.     Extraocular Movements: Extraocular movements intact.     Conjunctiva/sclera: Conjunctivae normal.     Pupils: Pupils are equal, round, and reactive to light.  Cardiovascular:     Rate and Rhythm: Normal rate and regular rhythm.  Pulmonary:     Effort: Pulmonary effort is normal. No respiratory distress.     Breath sounds: Normal breath sounds. No wheezing, rhonchi or rales.  Musculoskeletal:     Cervical back: Neck supple. No tenderness.  Lymphadenopathy:     Cervical: No cervical adenopathy.  Skin:    General: Skin is warm and dry.     Coloration: Skin is not jaundiced or pale.     Findings: No erythema or rash.  Neurological:     Mental Status: She is alert and oriented to person, place, and time. Mental status is at baseline.     Motor: No weakness.     Gait: Gait normal.  Psychiatric:        Mood and Affect: Mood normal.        Behavior: Behavior normal.        Thought Content: Thought content normal.        Judgment: Judgment normal.     Diabetic Foot Exam - Simple   Simple Foot Form Diabetic Foot exam was performed with the following findings: Yes 07/10/2022  8:59 AM  Visual Inspection No deformities, no ulcerations, no other skin breakdown bilaterally: Yes Sensation Testing Intact to touch and monofilament testing bilaterally: Yes Pulse Check Posterior Tibialis and Dorsalis pulse intact bilaterally: Yes Comments     Results for orders placed or performed in visit on 07/10/22 (from the past 24 hour(s))  POCT HgB A1C     Status: Abnormal   Collection Time: 07/10/22  8:58 AM  Result Value Ref Range   Hemoglobin A1C 5.6 4.0 - 5.6 %   HbA1c POC (<> result, manual entry) 5.6 4.0 - 5.6 %   HbA1c, POC (prediabetic range) 5.6 (A) 5.7 - 6.4 %   HbA1c, POC (controlled diabetic  range) 5.6 0.0 - 7.0 %     ASSESSMENT AND PLAN: Teresa Calderon is a 56 y.o. female present for [facility] type 2 diabetes mellitus w/ hyperlipidemia(HCC) stable -Discontinued Javuvia for great control.  - continue Metformin 500 mg daily.  - continue crestor PNA series:  PNA23 05/2019 UTD  Flu shot: UTD(recommneded yearly) Foot exam: completed 06/2022 Eye exam: referred again Microalbumin : +> started ace> microalbumin UTD A1c: 13.9 (10/26/2018)--> 5.4 >> 5.3> 5.0>5.1>5.4> 5.5> 5.5>5.6 today !! F/u 5.5 months   Essential hypertension, benign/. Morbid obesity (HCC)/Ectatic abdominal aorta (HCC)/Elevated LFTs/Hepatic steatosis/Hyperlipidemia LDL  goal <70/Palpitations stable - increase lisinopril 5 mg qd - continue metoprolol 125 mg daily  - continue  HCTZ to  42m daily (potassium low with higher doses) - continue  fenofibrate.   - continue crestor 10 mg qd  ABD [place]: Maximum aortic diameter of 2.6 cm. Ectatic abdominal aorta at risk for aneurysm development. Recommend followup by ultrasound in 5 years. DUE 10/2023 - f/u 5.5 mos   Lung cancer screening declined by patient Declined- pt counseled.   Tobacco abuse Declined cessation assistance.    Orders Placed This Encounter  Procedures   Lipid panel   Vitamin D (25 hydroxy)   Comp Met (CMET)   Ambulatory referral to Ophthalmology   POCT HgB A1C   Meds ordered this encounter  Medications   rosuvastatin (CRESTOR) 10 MG tablet    Sig: Take 1 tablet (10 mg total) by mouth daily.    Dispense:  30 tablet    Refill:  11   omeprazole (PRILOSEC) 40 MG capsule    Sig: Take 1 capsule (40 mg total) by mouth at bedtime.    Dispense:  30 capsule    Refill:  5   metoprolol succinate (TOPROL-XL) 25 MG 24 hr tablet    Sig: Take 1 tablet (25 mg total) by mouth daily.    Dispense:  30 tablet    Refill:  5    Add on to 100 mg tab   metoprolol succinate (TOPROL-XL) 100 MG 24 hr tablet    Sig: TAKE 1 TABLET BY MOUTH ONCE DAILY WITH OR  IMMEDIATELY FOLLOWING A MEAL    Dispense:  30 tablet    Refill:  5   metFORMIN (GLUCOPHAGE) 500 MG tablet    Sig: Take 1 tablet (500 mg total) by mouth daily with breakfast.    Dispense:  30 tablet    Refill:  5   lisinopril (ZESTRIL) 5 MG tablet    Sig: Take 1 tablet (5 mg total) by mouth daily.    Dispense:  30 tablet    Refill:  5   hydrochlorothiazide (HYDRODIURIL) 25 MG tablet    Sig: Take 1 tablet (25 mg total) by mouth daily.    Dispense:  30 tablet    Refill:  5   fenofibrate (TRICOR) 145 MG tablet    Sig: Take 1 tablet (145 mg total) by mouth daily.    Dispense:  30 tablet    Refill:  11    Referral Orders         Ambulatory referral to Ophthalmology      RHoward Pouch DO 07/10/2022

## 2022-07-10 NOTE — Patient Instructions (Signed)
Return in 24 weeks (on 12/25/2022) for cpe (20 min), Routine chronic condition follow-up.        Great to see you today.  I have refilled the medication(s) we provide.   If labs were collected, we will inform you of lab results once received either by echart message or telephone call.   - echart message- for normal results that have been seen by the patient already.   - telephone call: abnormal results or if patient has not viewed results in their echart.

## 2022-07-15 ENCOUNTER — Telehealth: Payer: Self-pay

## 2022-07-15 NOTE — Telephone Encounter (Signed)
Patient is returning call regarding lab results. Please call back when available

## 2022-07-16 NOTE — Telephone Encounter (Signed)
LVM for pt to CB regarding results.   Closing encounter 4th attempt to contact pt.

## 2022-09-24 ENCOUNTER — Other Ambulatory Visit: Payer: Self-pay | Admitting: Family Medicine

## 2022-09-24 DIAGNOSIS — Z1231 Encounter for screening mammogram for malignant neoplasm of breast: Secondary | ICD-10-CM

## 2022-10-11 ENCOUNTER — Inpatient Hospital Stay: Admission: RE | Admit: 2022-10-11 | Payer: BC Managed Care – PPO | Source: Ambulatory Visit

## 2022-12-22 ENCOUNTER — Other Ambulatory Visit: Payer: Self-pay | Admitting: Family Medicine

## 2022-12-26 ENCOUNTER — Encounter: Payer: Self-pay | Admitting: Family Medicine

## 2022-12-26 ENCOUNTER — Ambulatory Visit (INDEPENDENT_AMBULATORY_CARE_PROVIDER_SITE_OTHER): Payer: BC Managed Care – PPO | Admitting: Family Medicine

## 2022-12-26 VITALS — BP 126/75 | HR 66 | Temp 97.9°F | Ht 61.81 in | Wt 175.8 lb

## 2022-12-26 DIAGNOSIS — E2839 Other primary ovarian failure: Secondary | ICD-10-CM

## 2022-12-26 DIAGNOSIS — E1169 Type 2 diabetes mellitus with other specified complication: Secondary | ICD-10-CM | POA: Diagnosis not present

## 2022-12-26 DIAGNOSIS — R002 Palpitations: Secondary | ICD-10-CM

## 2022-12-26 DIAGNOSIS — E785 Hyperlipidemia, unspecified: Secondary | ICD-10-CM | POA: Diagnosis not present

## 2022-12-26 DIAGNOSIS — I77811 Abdominal aortic ectasia: Secondary | ICD-10-CM

## 2022-12-26 DIAGNOSIS — Z122 Encounter for screening for malignant neoplasm of respiratory organs: Secondary | ICD-10-CM

## 2022-12-26 DIAGNOSIS — E559 Vitamin D deficiency, unspecified: Secondary | ICD-10-CM

## 2022-12-26 DIAGNOSIS — Z6832 Body mass index (BMI) 32.0-32.9, adult: Secondary | ICD-10-CM

## 2022-12-26 DIAGNOSIS — Z23 Encounter for immunization: Secondary | ICD-10-CM

## 2022-12-26 DIAGNOSIS — Z0000 Encounter for general adult medical examination without abnormal findings: Secondary | ICD-10-CM | POA: Diagnosis not present

## 2022-12-26 DIAGNOSIS — F17210 Nicotine dependence, cigarettes, uncomplicated: Secondary | ICD-10-CM

## 2022-12-26 DIAGNOSIS — Z1231 Encounter for screening mammogram for malignant neoplasm of breast: Secondary | ICD-10-CM | POA: Diagnosis not present

## 2022-12-26 DIAGNOSIS — K219 Gastro-esophageal reflux disease without esophagitis: Secondary | ICD-10-CM

## 2022-12-26 DIAGNOSIS — Z1211 Encounter for screening for malignant neoplasm of colon: Secondary | ICD-10-CM | POA: Diagnosis not present

## 2022-12-26 DIAGNOSIS — Z9189 Other specified personal risk factors, not elsewhere classified: Secondary | ICD-10-CM | POA: Diagnosis not present

## 2022-12-26 DIAGNOSIS — Z720 Tobacco use: Secondary | ICD-10-CM

## 2022-12-26 DIAGNOSIS — I10 Essential (primary) hypertension: Secondary | ICD-10-CM

## 2022-12-26 LAB — CBC WITH DIFFERENTIAL/PLATELET
Basophils Absolute: 0.1 10*3/uL (ref 0.0–0.1)
Basophils Relative: 1 % (ref 0.0–3.0)
Eosinophils Absolute: 0.2 10*3/uL (ref 0.0–0.7)
Eosinophils Relative: 3.4 % (ref 0.0–5.0)
HCT: 44.3 % (ref 36.0–46.0)
Hemoglobin: 15.1 g/dL — ABNORMAL HIGH (ref 12.0–15.0)
Lymphocytes Relative: 36.8 % (ref 12.0–46.0)
Lymphs Abs: 2.6 10*3/uL (ref 0.7–4.0)
MCHC: 34 g/dL (ref 30.0–36.0)
MCV: 90.9 fl (ref 78.0–100.0)
Monocytes Absolute: 0.6 10*3/uL (ref 0.1–1.0)
Monocytes Relative: 8.5 % (ref 3.0–12.0)
Neutro Abs: 3.5 10*3/uL (ref 1.4–7.7)
Neutrophils Relative %: 50.3 % (ref 43.0–77.0)
Platelets: 248 10*3/uL (ref 150.0–400.0)
RBC: 4.87 Mil/uL (ref 3.87–5.11)
RDW: 12.9 % (ref 11.5–15.5)
WBC: 6.9 10*3/uL (ref 4.0–10.5)

## 2022-12-26 LAB — COMPREHENSIVE METABOLIC PANEL
ALT: 19 U/L (ref 0–35)
AST: 20 U/L (ref 0–37)
Albumin: 4.3 g/dL (ref 3.5–5.2)
Alkaline Phosphatase: 52 U/L (ref 39–117)
BUN: 17 mg/dL (ref 6–23)
CO2: 30 mEq/L (ref 19–32)
Calcium: 9.9 mg/dL (ref 8.4–10.5)
Chloride: 104 mEq/L (ref 96–112)
Creatinine, Ser: 0.56 mg/dL (ref 0.40–1.20)
GFR: 101.55 mL/min (ref >60.00)
Glucose, Bld: 107 mg/dL — ABNORMAL HIGH (ref 70–99)
Potassium: 4.1 mEq/L (ref 3.5–5.1)
Sodium: 142 mEq/L (ref 135–145)
Total Bilirubin: 0.5 mg/dL (ref 0.2–1.2)
Total Protein: 6.6 g/dL (ref 6.0–8.3)

## 2022-12-26 LAB — TSH: TSH: 1.23 u[IU]/mL (ref 0.35–5.50)

## 2022-12-26 LAB — LIPID PANEL
Cholesterol: 147 mg/dL (ref 0–200)
HDL: 58.9 mg/dL (ref >39.00)
LDL Cholesterol: 73 mg/dL (ref 0–99)
NonHDL: 88.26
Total CHOL/HDL Ratio: 2
Triglycerides: 78 mg/dL (ref 0.0–149.0)
VLDL: 15.6 mg/dL (ref 0.0–40.0)

## 2022-12-26 LAB — HEMOGLOBIN A1C: Hgb A1c MFr Bld: 6 % (ref 4.6–6.5)

## 2022-12-26 MED ORDER — FAMOTIDINE 20 MG PO TABS: 20.0000 mg | ORAL_TABLET | Freq: Two times a day (BID) | ORAL | 5 refills | Status: DC

## 2022-12-26 MED ORDER — LISINOPRIL 5 MG PO TABS: 5.0000 mg | ORAL_TABLET | Freq: Every day | ORAL | 5 refills | Status: DC

## 2022-12-26 MED ORDER — OMEPRAZOLE 40 MG PO CPDR: 40.0000 mg | DELAYED_RELEASE_CAPSULE | Freq: Every day | ORAL | 5 refills | Status: DC

## 2022-12-26 MED ORDER — HYDROCHLOROTHIAZIDE 25 MG PO TABS: 25.0000 mg | ORAL_TABLET | Freq: Every day | ORAL | 5 refills | Status: DC

## 2022-12-26 MED ORDER — METFORMIN HCL 500 MG PO TABS: 500.0000 mg | ORAL_TABLET | Freq: Every day | ORAL | 5 refills | Status: DC

## 2022-12-26 MED ORDER — METOPROLOL SUCCINATE ER 25 MG PO TB24: 25.0000 mg | ORAL_TABLET | Freq: Every day | ORAL | 5 refills | Status: DC

## 2022-12-26 MED ORDER — METOPROLOL SUCCINATE ER 100 MG PO TB24: ORAL_TABLET | ORAL | 5 refills | Status: DC

## 2022-12-26 NOTE — Progress Notes (Signed)
Patient Care Team    Relationship Specialty Notifications Start End  Teresa Hillock, DO PCP - General Family Medicine  01/17/16     SUBJECTIVE Chief Complaint  Patient presents with   Annual Exam    Pt is fasting    HPI: Teresa Calderon is a 57 y.o. female present for cpe and Chronic Conditions/illness Management combination appointment..All past medical history, surgical history, allergies, family history, immunizations and social history was obtained from the patient today and entered into the electronic medical record.   Health maintenance: Colonoscopy: Cologuard completed 09/2019.  Due for repeat screening today.  Patient elected to repeat Cologuard. Mammogram: completed: overdue.  Patient has a great deal of stress surrounding cancer screenings in general.  Mammogram order has been placed for her on a few occasions and she states she "chickened out.  "She is agreeable to have order placed again for her and hopes that I will be completed with her bone density scan. Cervical cancer screening: last pap: 10/26/2018, results: WNL/Neg HPV rpt 5 years.  Immunizations: tdap 01/31/2015, Influenza completed today (encouraged yearly),  zostavax Shingrix series completed Infectious disease screening: HIV and hepatitis C screenings completed DEXA: Ordered today for high risk for osteoporosis female, Caucasian smoker with vitamin D deficiency  type 2 diabetes mellitus ([place]) Diagnosed 10/26/2018 with a A1c 13.9.     Patient reports compliance with metformin 500 mg daily.  Januvia had been discontinued secondary to very well controlled diabetes and metformin decreased. Patient denies dizziness, hyperglycemic or hypoglycemic events. Patient denies numbness, tingling in the extremities or nonhealing wounds of feet.   Essential hypertension, benign/. Morbid obesity (HCC)/Ectatic abdominal aorta (HCC)/Elevated LFTs/Hepatic steatosis/Hyperlipidemia LDL goal <130/statin declined Patient reports compliance  with metoprolol 125 mg daily, lisinopril 5 mg and HCTZ 25 mg daily.  SHe is taking fenofibrate  and crestor. Patient denies chest pain, shortness of breath, dizziness or lower extremity edema.  She does still have palpitations, which she states is worsening over the last couple months.  She denies any associated symptoms and reports they only last for less than a minute, but have become more noticeable.  She was established with cardiology in 2016, but has not been seen since. RF: Hypertension, hyperlipidemia, Fhx coronary artery disease  Shared decision making visit for lung cancer screening: Patient was brought in today for a office visit concerning shared decision making for their lung cancer screening.Teresa Calderon is a 57 y.o. female Patient is between the ages of 82-80: Yes Patient is a current smoker with at least 30 year pack year history or Patient is a former smoker, quit less than 15 years ago and has a 30 pack year history : Yes Patient has current symptoms: No Patient has a  health problem that substantially limits life expectancy or the ability or willingness to have curative lung surgery: No   ROS: See pertinent positives and negatives per HPI.  Patient Active Problem List   Diagnosis Date Noted   Lung cancer screening declined by patient 01/23/2022   BMI 32.0-32.9,adult 08/21/2021   Ectatic abdominal aorta ([place]) 11/03/2018   Type 2 diabetes mellitus with hyperlipidemia ([place]) 10/28/2018   Leukocytosis 10/28/2018   Elevated LFTs 10/28/2018   Tobacco abuse 10/26/2018   Morbid obesity ([place]) 04/11/2017   Vitamin D deficiency 01/31/2015   Essential hypertension, benign 03/08/2014   Palpitations 01/20/2012    Social History   Tobacco Use   Smoking status: Every Day    Packs/day: 1.00  Years: 30.00    Total pack years: 30.00    Types: Cigarettes   Smokeless tobacco: Never  Substance Use Topics   Alcohol use: No    Alcohol/week: 0.0 standard drinks of alcohol     Current Outpatient Medications:    Accu-Chek FastClix Lancets MISC, Monitor fasting blood sugars daily., Disp: 99 each, Rfl: 11   blood glucose meter kit and supplies KIT, Dispense based on patient and insurance preference. Use up to two times daily as directed. ., Disp: 1 each, Rfl: 0   Cholecalciferol 50 MCG (2000 UT) TABS, Take 2,000 Units by mouth daily., Disp: , Rfl:    fenofibrate (TRICOR) 145 MG tablet, Take 1 tablet (145 mg total) by mouth daily., Disp: 30 tablet, Rfl: 11   glucose blood (ACCU-CHEK GUIDE) test strip, Use as instructed, Disp: 100 each, Rfl: 12   hydrochlorothiazide (HYDRODIURIL) 25 MG tablet, Take 1 tablet (25 mg total) by mouth daily., Disp: 30 tablet, Rfl: 5   lisinopril (ZESTRIL) 5 MG tablet, Take 1 tablet (5 mg total) by mouth daily., Disp: 30 tablet, Rfl: 5   metFORMIN (GLUCOPHAGE) 500 MG tablet, Take 1 tablet (500 mg total) by mouth daily with breakfast., Disp: 30 tablet, Rfl: 5   metoprolol succinate (TOPROL-XL) 100 MG 24 hr tablet, TAKE 1 TABLET BY MOUTH ONCE DAILY WITH OR IMMEDIATELY FOLLOWING A MEAL, Disp: 30 tablet, Rfl: 5   metoprolol succinate (TOPROL-XL) 25 MG 24 hr tablet, Take 1 tablet (25 mg total) by mouth daily., Disp: 30 tablet, Rfl: 5   Multiple Vitamin (MULTIVITAMIN) tablet, Take 1 tablet by mouth daily., Disp: , Rfl:    Omega-3 Fatty Acids (FISH OIL) 1000 MG CAPS, Take 1 capsule by mouth daily., Disp: , Rfl:    omeprazole (PRILOSEC) 40 MG capsule, Take 1 capsule (40 mg total) by mouth at bedtime., Disp: 30 capsule, Rfl: 5   rosuvastatin (CRESTOR) 10 MG tablet, Take 1 tablet (10 mg total) by mouth daily., Disp: 30 tablet, Rfl: 11  No Known Allergies  OBJECTIVE: BP 126/75   Pulse 66   Temp 97.9 F (36.6 C)   Ht 5' 1.81" (1.57 m)   Wt 175 lb 12.8 oz (79.7 kg)   LMP 04/17/2015   SpO2 98%   BMI 32.35 kg/m  Physical Exam Vitals and nursing note reviewed.  Constitutional:      General: She is not in acute distress.    Appearance: Normal  appearance. She is not ill-appearing or toxic-appearing.  HENT:     Head: Normocephalic and atraumatic.     Right Ear: Tympanic membrane, ear canal and external ear normal. There is no impacted cerumen.     Left Ear: Tympanic membrane, ear canal and external ear normal. There is no impacted cerumen.     Nose: No congestion or rhinorrhea.     Mouth/Throat:     Mouth: Mucous membranes are moist.     Pharynx: Oropharynx is clear. No oropharyngeal exudate or posterior oropharyngeal erythema.  Eyes:     General: No scleral icterus.       Right eye: No discharge.        Left eye: No discharge.     Extraocular Movements: Extraocular movements intact.     Conjunctiva/sclera: Conjunctivae normal.     Pupils: Pupils are equal, round, and reactive to light.  Cardiovascular:     Rate and Rhythm: Normal rate and regular rhythm.     Pulses: Normal pulses.     Heart sounds: Normal heart  sounds. No murmur heard.    No friction rub. No gallop.  Pulmonary:     Effort: Pulmonary effort is normal. No respiratory distress.     Breath sounds: Normal breath sounds. No stridor. No wheezing, rhonchi or rales.  Chest:     Chest wall: No tenderness.  Abdominal:     General: Abdomen is flat. Bowel sounds are normal. There is no distension.     Palpations: Abdomen is soft. There is no mass.     Tenderness: There is no abdominal tenderness. There is no right CVA tenderness, left CVA tenderness, guarding or rebound.     Hernia: No hernia is present.  Musculoskeletal:        General: No swelling, tenderness or deformity. Normal range of motion.     Cervical back: Normal range of motion and neck supple. No rigidity or tenderness.     Right lower leg: No edema.     Left lower leg: No edema.  Lymphadenopathy:     Cervical: No cervical adenopathy.  Skin:    General: Skin is warm and dry.     Coloration: Skin is not jaundiced or pale.     Findings: No bruising, erythema, lesion or rash.  Neurological:      General: No focal deficit present.     Mental Status: She is alert and oriented to person, place, and time. Mental status is at baseline.     Cranial Nerves: No cranial nerve deficit.     Sensory: No sensory deficit.     Motor: No weakness.     Coordination: Coordination normal.     Gait: Gait normal.     Deep Tendon Reflexes: Reflexes normal.  Psychiatric:        Mood and Affect: Mood normal.        Behavior: Behavior normal.        Thought Content: Thought content normal.        Judgment: Judgment normal.     Diabetic Foot Exam - Simple   No data filed     No results found for this or any previous visit (from the past 24 hour(s)).    ASSESSMENT AND PLAN: Ciela Mahajan is a 57 y.o. female present for CPE and chronic conditions management combination appointment  type 2 diabetes mellitus w/ hyperlipidemia(HCC) Stable -Discontinued Javuvia for great control.  -Continue metformin 500 mg daily.  -Continue crestor PNA series:  PNA23 05/2019 UTD  Flu shot: UTD completed today (recommneded yearly) Foot exam: completed 06/2022 Eye exam: referred again-had been referred again on 07/10/2022. Microalbumin : +> started ace> microalbumin UTD A1c: 13.9 (10/26/2018)--> 5.4 >> 5.3> 5.0>5.1>5.4> 5.5> 5.5>5.6> A1C ordered today today !! F/u 5.5 months  GERD:  Continue omeprazole 40 mg daily.   Added Pepcid to her regimen today for reports of worsening reflux pain of her epigastric region. Encouraged patient if adding Pepcid twice daily to her regimen does improve to be helpful, she is to follow-up with [place] within 1 month.  Essential hypertension, benign/. Morbid obesity (HCC)/Ectatic abdominal aorta (HCC)/Elevated LFTs/Hepatic steatosis/Hyperlipidemia LDL goal <70/Palpitations Stable -Continue lisinopril 5 mg qd -Continue metoprolol 125 mg daily  -Continue HCTZ to  20m daily (potassium low with higher doses) -Continue fenofibrate.   -Continue crestor 10 mg qd  [place]D [place]: Maximum  aortic diameter of 2.6 cm. Ectatic abdominal aorta at risk for aneurysm development. Recommend followup by ultrasound in 5 years. DUE 10/2023 - CBC with Differential/Platelet - Comprehensive metabolic panel - Hemoglobin A1c -  Lipid panel - TSH - f/u 5.5 mos  Lung cancer screening /Tobacco abuse/greater than a 20-pack-year history -Patient was counseled on lung cancer screening today.  Patient does meet criteria for lung cancer screening.  Patient would like to proceed with lung cancer screening.  Patient understands screening may warrant further studies or repeat studies if any abnormality is found.  Patient is agreeable. -Patient has had a recent kidney function test:Yes - CT CHEST LUNG CA SCREEN LOW DOSE W/O CM; Future - Follow-up upon screening results.  Breast cancer screening by mammogram - MM Digital Diagnostic Bilat; Future Colon cancer screening - Cologuard Influenza vaccine needed - Flu Vaccine QUAD 33moIM (Fluarix, Fluzone & Alfiuria Quad PF) Tobacco abuse Declined cessation - CT CHEST LUNG CA SCREEN LOW DOSE W/O CM; Future - DG Bone Density; Future Palpitations worsening - Ambulatory referral to Cardiology Estrogen deficiency/ At high risk for osteoporosis/Vitamin D deficiency - DG Bone Density; Future  Routine general medical examination at a health care facility Patient was encouraged to exercise greater than 150 minutes a week. Patient was encouraged to choose a diet filled with fresh fruits and vegetables, and lean meats. AVS provided to patient today for education/recommendation on gender specific health and safety maintenance. Colonoscopy: Cologuard completed 09/2019.  Due for repeat screening today.  Patient elected to repeat Cologuard. Mammogram: completed: overdue.  Patient has a great deal of stress surrounding cancer screenings in general.  Mammogram order has been placed for her on a few occasions and she states she "chickened out.  "She is agreeable to have  order placed again for her and hopes that I will be completed with her bone density scan. Cervical cancer screening: last pap: 10/26/2018, results: WNL/Neg HPV rpt 5 years.  Immunizations: tdap 01/31/2015, Influenza completed today (encouraged yearly),  zostavax Shingrix series completed Infectious disease screening: HIV and hepatitis C screenings completed DEXA: Ordered today for high risk for osteoporosis female, Caucasian smoker with vitamin D deficiency   Orders Placed This Encounter  Procedures   CBC with Differential/Platelet   Comprehensive metabolic panel   Hemoglobin A1c   Lipid panel   TSH   No orders of the defined types were placed in this encounter.   Referral Orders  No referral(s) requested today     RHoward Pouch DO 12/26/2022

## 2022-12-26 NOTE — Patient Instructions (Addendum)
Return in about 15 weeks (around 04/10/2023) for Routine chronic condition follow-up.        Great to see you today.  I have refilled the medication(s) we provide.   If labs were collected, we will inform you of lab results once received either by echart message or telephone call.   - echart message- for normal results that have been seen by the patient already.   - telephone call: abnormal results or if patient has not viewed results in their echart.  Health Maintenance, Female Adopting a healthy lifestyle and getting preventive care are important in promoting health and wellness. Ask your health care provider about: The right schedule for you to have regular tests and exams. Things you can do on your own to prevent diseases and keep yourself healthy. What should I know about diet, weight, and exercise? Eat a healthy diet  Eat a diet that includes plenty of vegetables, fruits, low-fat dairy products, and lean protein. Do not eat a lot of foods that are high in solid fats, added sugars, or sodium. Maintain a healthy weight Body mass index (BMI) is used to identify weight problems. It estimates body fat based on height and weight. Your health care provider can help determine your BMI and help you achieve or maintain a healthy weight. Get regular exercise Get regular exercise. This is one of the most important things you can do for your health. Most adults should: Exercise for at least 150 minutes each week. The exercise should increase your heart rate and make you sweat (moderate-intensity exercise). Do strengthening exercises at least twice a week. This is in addition to the moderate-intensity exercise. Spend less time sitting. Even light physical activity can be beneficial. Watch cholesterol and blood lipids Have your blood tested for lipids and cholesterol at 58 years of age, then have this test every 5 years. Have your cholesterol levels checked more often if: Your lipid or  cholesterol levels are high. You are older than 58 years of age. You are at high risk for heart disease. What should I know about cancer screening? Depending on your health history and family history, you may need to have cancer screening at various ages. This may include screening for: Breast cancer. Cervical cancer. Colorectal cancer. Skin cancer. Lung cancer. What should I know about heart disease, diabetes, and high blood pressure? Blood pressure and heart disease High blood pressure causes heart disease and increases the risk of stroke. This is more likely to develop in people who have high blood pressure readings or are overweight. Have your blood pressure checked: Every 3-5 years if you are 37-39 years of age. Every year if you are 17 years old or older. Diabetes Have regular diabetes screenings. This checks your fasting blood sugar level. Have the screening done: Once every three years after age 36 if you are at a normal weight and have a low risk for diabetes. More often and at a younger age if you are overweight or have a high risk for diabetes. What should I know about preventing infection? Hepatitis B If you have a higher risk for hepatitis B, you should be screened for this virus. Talk with your health care provider to find out if you are at risk for hepatitis B infection. Hepatitis C Testing is recommended for: Everyone born from [birth year] through 1966. Anyone with known risk factors for hepatitis C. Sexually transmitted infections (STIs) Get screened for STIs, including gonorrhea and chlamydia, if: You are sexually active and are  younger than 58 years of age. You are older than 58 years of age and your health care provider tells you that you are at risk for this type of infection. Your sexual activity has changed since you were last screened, and you are at increased risk for chlamydia or gonorrhea. Ask your health care provider if you are at risk. Ask your health care  provider about whether you are at high risk for HIV. Your health care provider may recommend a prescription medicine to help prevent HIV infection. If you choose to take medicine to prevent HIV, you should first get tested for HIV. You should then be tested every 3 months for as long as you are taking the medicine. Pregnancy If you are about to stop having your period (premenopausal) and you may become pregnant, seek counseling before you get pregnant. Take 400 to 800 micrograms (mcg) of folic acid every day if you become pregnant. Ask for birth control (contraception) if you want to prevent pregnancy. Osteoporosis and menopause Osteoporosis is a disease in which the bones lose minerals and strength with aging. This can result in bone fractures. If you are 72 years old or older, or if you are at risk for osteoporosis and fractures, ask your health care provider if you should: Be screened for bone loss. Take a calcium or vitamin D supplement to lower your risk of fractures. Be given hormone replacement therapy (HRT) to treat symptoms of menopause. Follow these instructions at home: Alcohol use Do not drink alcohol if: Your health care provider tells you not to drink. You are pregnant, may be pregnant, or are planning to become pregnant. If you drink alcohol: Limit how much you have to: 0-1 drink a day. Know how much alcohol is in your drink. In the [place] one drink equals one 12 oz bottle of beer (355 mL), one 5 oz glass of wine (148 mL), or one 1 oz glass of hard liquor (44 mL). Lifestyle Do not use any products that contain nicotine or tobacco. These products include cigarettes, chewing tobacco, and vaping devices, such as e-cigarettes. If you need help quitting, ask your health care provider. Do not use street drugs. Do not share needles. Ask your health care provider for help if you need support or information about quitting drugs. General instructions Schedule regular health, dental, and  eye exams. Stay current with your vaccines. Tell your health care provider if: You often feel depressed. You have ever been abused or do not feel safe at home. Summary Adopting a healthy lifestyle and getting preventive care are important in promoting health and wellness. Follow your health care provider's instructions about healthy diet, exercising, and getting tested or screened for diseases. Follow your health care provider's instructions on monitoring your cholesterol and blood pressure. This information is not intended to replace advice given to you by your health care provider. Make sure you discuss any questions you have with your health care provider. Document Revised: 04/30/2021 Document Reviewed: 04/30/2021 Elsevier Patient Education  [employer].

## 2023-01-02 NOTE — Addendum Note (Signed)
Addended by: Beatrix Fetters on: 01/02/2023 01:29 PM   Modules accepted: Orders

## 2023-01-08 ENCOUNTER — Telehealth: Payer: Self-pay | Admitting: Family Medicine

## 2023-01-08 DIAGNOSIS — Z720 Tobacco use: Secondary | ICD-10-CM

## 2023-01-08 DIAGNOSIS — Z122 Encounter for screening for malignant neoplasm of respiratory organs: Secondary | ICD-10-CM

## 2023-01-08 DIAGNOSIS — F17210 Nicotine dependence, cigarettes, uncomplicated: Secondary | ICD-10-CM

## 2023-01-08 NOTE — Telephone Encounter (Signed)
Location changed 

## 2023-01-08 NOTE — Telephone Encounter (Signed)
Pt would like to switch her location for lung screening to [facility] not [facility]. She is aware this will delay her care.

## 2023-01-20 LAB — COLOGUARD: COLOGUARD: NEGATIVE

## 2023-02-03 ENCOUNTER — Ambulatory Visit: Payer: BC Managed Care – PPO | Admitting: Family Medicine

## 2023-02-03 ENCOUNTER — Encounter: Payer: Self-pay | Admitting: Family Medicine

## 2023-02-03 VITALS — BP 136/83 | HR 83 | Temp 98.0°F | Wt 172.8 lb

## 2023-02-03 DIAGNOSIS — R300 Dysuria: Secondary | ICD-10-CM | POA: Diagnosis not present

## 2023-02-03 DIAGNOSIS — N390 Urinary tract infection, site not specified: Secondary | ICD-10-CM | POA: Diagnosis not present

## 2023-02-03 LAB — POC URINALSYSI DIPSTICK (AUTOMATED)
Blood, UA: NEGATIVE
Glucose, UA: NEGATIVE
Ketones, UA: NEGATIVE
Protein, UA: POSITIVE — AB
Spec Grav, UA: 1.01 (ref 1.010–1.025)
Urobilinogen, UA: 1 E.U./dL
pH, UA: 8.5 — AB (ref 5.0–8.0)

## 2023-02-03 MED ORDER — CEPHALEXIN 500 MG PO CAPS: 500.0000 mg | ORAL_CAPSULE | Freq: Three times a day (TID) | ORAL | 0 refills | Status: DC

## 2023-02-03 NOTE — Patient Instructions (Addendum)
Return if symptoms worsen or fail to improve.        Great to see you today.  I have refilled the medication(s) we provide.   If labs were collected, we will inform you of lab results once received either by echart message or telephone call.   - echart message- for normal results that have been seen by the patient already.   - telephone call: abnormal results or if patient has not viewed results in their echart.

## 2023-02-03 NOTE — Progress Notes (Signed)
Teresa Calderon , 03-30-1966, 57 y.o., female MRN: [id number] Patient Care Team    Relationship Specialty Notifications Start End  Ma Hillock, DO PCP - General Family Medicine  01/17/16     Chief Complaint  Patient presents with   Dysuria     Subjective: Pt presents for an OV with complaints of dysuria  of >7 days duration.  Associated symptoms include water pressure.  Patient denies fevers, chills, nausea or low back pain. Pan- sensitive E.coli in the past     12/26/2022    8:08 AM 01/23/2022    8:56 AM 01/17/2021   10:41 AM 04/07/2020    7:21 PM 10/26/2018    3:01 PM  Depression screen PHQ 2/9  Decreased Interest 0 0 0 0 0  Down, Depressed, Hopeless 0 0 0  0  PHQ - 2 Score 0 0 0 0 0    No Known Allergies Social History   Social History Narrative   Lives with husband Elta [place])  and son.   HS grad, [employer]	[place].    Smoke detector in the home, wears her seat belt.    No firearms.    Feels safe in relationship.    Past Medical History:  Diagnosis Date   Abnormal LFTs 02/23/2012   Anxiety    Cervical cancer screening 02/19/2012   Hyperglycemia    Hyperlipidemia    Hypertension    Tobacco abuse 01/26/2012   Past Surgical History:  Procedure Laterality Date   CESAREAN SECTION  1990   Family History  Problem Relation Age of Onset   Hypertension Mother    Hyperlipidemia Mother    Coronary artery disease Mother 54       Died in her sleep.  Not a proven MI.  No history prior of this.   COPD Father        smoked   Breast cancer Maternal Aunt    Allergies as of 02/03/2023   No Known Allergies      Medication List        Accurate as of February 03, 2023  2:40 PM. If you have any questions, ask your nurse or doctor.          cephALEXin 500 MG capsule Commonly known as: KEFLEX Take 1 capsule (500 mg total) by mouth 3 (three) times daily. Started by: Howard Pouch, DO   Cholecalciferol 50 MCG (2000 UT) Tabs Take 2,000 Units by mouth  daily.   famotidine 20 MG tablet Commonly known as: Pepcid Take 1 tablet (20 mg total) by mouth 2 (two) times daily.   fenofibrate 145 MG tablet Commonly known as: TRICOR Take 1 tablet (145 mg total) by mouth daily.   Fish Oil 1000 MG Caps Take 1 capsule by mouth daily.   hydrochlorothiazide 25 MG tablet Commonly known as: HYDRODIURIL Take 1 tablet (25 mg total) by mouth daily.   lisinopril 5 MG tablet Commonly known as: ZESTRIL Take 1 tablet (5 mg total) by mouth daily.   metFORMIN 500 MG tablet Commonly known as: GLUCOPHAGE Take 1 tablet (500 mg total) by mouth daily with breakfast.   metoprolol succinate 100 MG 24 hr tablet Commonly known as: TOPROL-XL TAKE 1 TABLET BY MOUTH ONCE DAILY WITH OR IMMEDIATELY FOLLOWING A MEAL   metoprolol succinate 25 MG 24 hr tablet Commonly known as: TOPROL-XL Take 1 tablet (25 mg total) by mouth daily.   multivitamin tablet Take 1 tablet by mouth daily.   omeprazole  40 MG capsule Commonly known as: PRILOSEC Take 1 capsule (40 mg total) by mouth at bedtime.   rosuvastatin 10 MG tablet Commonly known as: Crestor Take 1 tablet (10 mg total) by mouth daily.        All past medical history, surgical history, allergies, family history, immunizations andmedications were updated in the EMR today and reviewed under the history and medication portions of their EMR.     ROS Negative, with the exception of above mentioned in HPI  Objective:  BP 136/83   Pulse 83   Temp 98 F (36.7 C)   Wt 172 lb 12.8 oz (78.4 kg)   LMP 04/17/2015   SpO2 99%   BMI 31.80 kg/m  Body mass index is 31.8 kg/m. Physical Exam Vitals and nursing note reviewed.  Constitutional:      General: She is not in acute distress.    Appearance: Normal appearance. She is normal weight. She is not ill-appearing or toxic-appearing.  HENT:     Head: Normocephalic and atraumatic.  Eyes:     General: No scleral icterus.       Right eye: No discharge.         Left eye: No discharge.     Extraocular Movements: Extraocular movements intact.     Conjunctiva/sclera: Conjunctivae normal.     Pupils: Pupils are equal, round, and reactive to light.  Abdominal:     General: There is no distension.     Tenderness: There is abdominal tenderness (Suprapubic). There is no right CVA tenderness, left CVA tenderness, guarding or rebound.  Skin:    Findings: No rash.  Neurological:     Mental Status: She is alert and oriented to person, place, and time. Mental status is at baseline.     Motor: No weakness.     Coordination: Coordination normal.     Gait: Gait normal.  Psychiatric:        Mood and Affect: Mood normal.        Behavior: Behavior normal.        Thought Content: Thought content normal.        Judgment: Judgment normal.    No results found. No results found. Results for orders placed or performed in visit on 02/03/23 (from the past 24 hour(s))  POCT Urinalysis Dipstick (Automated)     Status: Abnormal   Collection Time: 02/03/23  2:17 PM  Result Value Ref Range   Color, UA amber    Clarity, UA cloudy    Glucose, UA Negative Negative   Bilirubin, UA 1+    Ketones, UA neg    Spec Grav, UA 1.010 1.010 - 1.025   Blood, UA neg    pH, UA 8.5 (A) 5.0 - 8.0   Protein, UA Positive (A) Negative   Urobilinogen, UA 1.0 0.2 or 1.0 E.U./dL   Nitrite, UA +    Leukocytes, UA Moderate (2+) (A) Negative    Assessment/Plan: Teresa Calderon is a 57 y.o. female present for OV for  Dysuria/Urinary tract infection without hematuria, site unspecified Point-of-care urine does appear infectious today with positive leuks and nitrites. Patient encouraged to hydrate well. Since she has had the symptoms going on about a week, elected to treat with Keflex 3 times daily starting today while we wait on urine culture. - Urinalysis w microscopic + reflex cultur - POCT Urinalysis Dipstick (Automated) Patient will be called with culture results and further plan  discussed at that time.  Reviewed expectations re: course of  current medical issues. Discussed self-management of symptoms. Outlined signs and symptoms indicating need for more acute intervention. Patient verbalized understanding and all questions were answered. Patient received an After-Visit Summary.    Orders Placed This Encounter  Procedures   Urinalysis w microscopic + reflex cultur   POCT Urinalysis Dipstick (Automated)   Meds ordered this encounter  Medications   cephALEXin (KEFLEX) 500 MG capsule    Sig: Take 1 capsule (500 mg total) by mouth 3 (three) times daily.    Dispense:  21 capsule    Refill:  0   Referral Orders  No referral(s) requested today     Note is dictated utilizing voice recognition software. Although note has been proof read prior to signing, occasional typographical errors still can be missed. If any questions arise, please do not hesitate to call for verification.   electronically signed by:  Howard Pouch, DO  [place]

## 2023-02-06 LAB — URINALYSIS W MICROSCOPIC + REFLEX CULTURE
Bilirubin Urine: NEGATIVE
Glucose, UA: NEGATIVE
Hgb urine dipstick: NEGATIVE
Hyaline Cast: NONE SEEN /LPF
Ketones, ur: NEGATIVE
Nitrites, Initial: POSITIVE — AB
Specific Gravity, Urine: 1.024 (ref 1.001–1.035)
pH: >=9 — AB (ref 5.0–8.0)

## 2023-02-06 LAB — URINE CULTURE
MICRO NUMBER:: 14557273
SPECIMEN QUALITY:: ADEQUATE

## 2023-02-06 LAB — CULTURE INDICATED

## 2023-02-07 ENCOUNTER — Telehealth: Payer: Self-pay | Admitting: Family Medicine

## 2023-02-07 NOTE — Telephone Encounter (Signed)
Please inform patient her urine culture resulted with a UTI that will be treated appropriately with the antibiotic prescribed.  Take until completion.

## 2023-02-07 NOTE — Telephone Encounter (Signed)
LM for pt to return call to discuss.  

## 2023-02-10 NOTE — Telephone Encounter (Signed)
LM for pt to return call to discuss.  

## 2023-02-11 NOTE — Telephone Encounter (Signed)
LM for pt to return call to discuss.  

## 2023-04-03 ENCOUNTER — Encounter: Payer: Self-pay | Admitting: Family Medicine

## 2023-04-03 ENCOUNTER — Ambulatory Visit (INDEPENDENT_AMBULATORY_CARE_PROVIDER_SITE_OTHER): Payer: BC Managed Care – PPO | Admitting: Family Medicine

## 2023-04-03 DIAGNOSIS — I77811 Abdominal aortic ectasia: Secondary | ICD-10-CM

## 2023-04-03 DIAGNOSIS — K219 Gastro-esophageal reflux disease without esophagitis: Secondary | ICD-10-CM

## 2023-04-03 DIAGNOSIS — E559 Vitamin D deficiency, unspecified: Secondary | ICD-10-CM

## 2023-04-03 DIAGNOSIS — E1169 Type 2 diabetes mellitus with other specified complication: Secondary | ICD-10-CM

## 2023-04-03 DIAGNOSIS — Z91199 Patient's noncompliance with other medical treatment and regimen due to unspecified reason: Secondary | ICD-10-CM

## 2023-04-03 DIAGNOSIS — I10 Essential (primary) hypertension: Secondary | ICD-10-CM

## 2023-04-03 DIAGNOSIS — E785 Hyperlipidemia, unspecified: Secondary | ICD-10-CM

## 2023-04-03 NOTE — Progress Notes (Signed)
NO show

## 2023-04-03 NOTE — Patient Instructions (Addendum)
No show appt

## 2023-04-14 ENCOUNTER — Ambulatory Visit: Payer: BC Managed Care – PPO | Admitting: Family Medicine

## 2023-04-14 ENCOUNTER — Encounter: Payer: Self-pay | Admitting: Family Medicine

## 2023-04-14 VITALS — BP 132/82 | HR 65 | Temp 97.9°F | Wt 164.8 lb

## 2023-04-14 DIAGNOSIS — E1169 Type 2 diabetes mellitus with other specified complication: Secondary | ICD-10-CM

## 2023-04-14 DIAGNOSIS — Z7984 Long term (current) use of oral hypoglycemic drugs: Secondary | ICD-10-CM

## 2023-04-14 DIAGNOSIS — R002 Palpitations: Secondary | ICD-10-CM

## 2023-04-14 DIAGNOSIS — I77811 Abdominal aortic ectasia: Secondary | ICD-10-CM

## 2023-04-14 DIAGNOSIS — K219 Gastro-esophageal reflux disease without esophagitis: Secondary | ICD-10-CM

## 2023-04-14 DIAGNOSIS — I10 Essential (primary) hypertension: Secondary | ICD-10-CM

## 2023-04-14 DIAGNOSIS — E785 Hyperlipidemia, unspecified: Secondary | ICD-10-CM

## 2023-04-14 LAB — POCT GLYCOSYLATED HEMOGLOBIN (HGB A1C)
HbA1c POC (<> result, manual entry): 5.5 % (ref 4.0–5.6)
HbA1c, POC (controlled diabetic range): 5.5 % (ref 0.0–7.0)
HbA1c, POC (prediabetic range): 5.5 % — AB (ref 5.7–6.4)
Hemoglobin A1C: 5.5 % (ref 4.0–5.6)

## 2023-04-14 LAB — MICROALBUMIN / CREATININE URINE RATIO
Creatinine,U: 76.3 mg/dL
Microalb Creat Ratio: 1.1 mg/g (ref 0.0–30.0)
Microalb, Ur: 0.9 mg/dL (ref 0.0–1.9)

## 2023-04-14 MED ORDER — LISINOPRIL-HYDROCHLOROTHIAZIDE 10-12.5 MG PO TABS: 1.0000 | ORAL_TABLET | Freq: Every day | ORAL | 5 refills | Status: DC

## 2023-04-14 MED ORDER — METOPROLOL SUCCINATE ER 100 MG PO TB24: ORAL_TABLET | ORAL | 5 refills | Status: DC

## 2023-04-14 MED ORDER — ROSUVASTATIN CALCIUM 10 MG PO TABS: 10.0000 mg | ORAL_TABLET | Freq: Every day | ORAL | 11 refills | Status: DC

## 2023-04-14 MED ORDER — METOPROLOL SUCCINATE ER 25 MG PO TB24: 25.0000 mg | ORAL_TABLET | Freq: Every day | ORAL | 5 refills | Status: DC

## 2023-04-14 MED ORDER — OMEPRAZOLE 40 MG PO CPDR: 40.0000 mg | DELAYED_RELEASE_CAPSULE | Freq: Every day | ORAL | 5 refills | Status: DC

## 2023-04-14 MED ORDER — FAMOTIDINE 20 MG PO TABS: 20.0000 mg | ORAL_TABLET | Freq: Two times a day (BID) | ORAL | 5 refills | Status: DC

## 2023-04-14 MED ORDER — FENOFIBRATE 145 MG PO TABS: 145.0000 mg | ORAL_TABLET | Freq: Every day | ORAL | 11 refills | Status: DC

## 2023-04-14 MED ORDER — METFORMIN HCL 500 MG PO TABS: 500.0000 mg | ORAL_TABLET | Freq: Every day | ORAL | 5 refills | Status: DC

## 2023-04-14 NOTE — Progress Notes (Signed)
Patient Care Team    Relationship Specialty Notifications Start End  Natalia Leatherwood, DO PCP - General Family Medicine  01/17/16     SUBJECTIVE Chief Complaint  Patient presents with   Diabetes    HPI: Teresa Calderon is a 57 y.o. female present for Chronic Conditions/illness Management combination appointment..All past medical history, surgical history, allergies, family history, immunizations and social history was obtained from the patient today and entered into the electronic medical record.    type 2 diabetes mellitus (HCC) Diagnosed 10/26/2018 with a A1c 13.9.     Patient reports compliance with metformin 500 mg daily. Patient denies dizziness, hyperglycemic or hypoglycemic events. Patient denies numbness, tingling in the extremities or nonhealing wounds of feet.   Essential hypertension, benign/. Morbid obesity (HCC)/Ectatic abdominal aorta (HCC)/Elevated LFTs/Hepatic steatosis/Hyperlipidemia LDL goal <130/statin declined Patient reports compliance with metoprolol 125 mg daily, lisinopril 5 mg and HCTZ 25 mg daily.  SHe is taking fenofibrate  and crestor. Patient denies chest pain, shortness of breath, dizziness or lower extremity edema.   She does still have palpitations, which she states is worsening over the last couple months.  She denies any associated symptoms and reports they only last for less than a minute, but have become more noticeable.  She was established with cardiology in 2016, but has not been seen since. RF: Hypertension, hyperlipidemia, Fhx coronary artery disease    ROS: See pertinent positives and negatives per HPI.  Patient Active Problem List   Diagnosis Date Noted   Smoking greater than 20 pack years 12/26/2022   GERD (gastroesophageal reflux disease) 12/26/2022   Lung cancer screening declined by patient 01/23/2022   BMI 32.0-32.9,adult 08/21/2021   Ectatic abdominal aorta 11/03/2018   Type 2 diabetes mellitus with hyperlipidemia 10/28/2018    Leukocytosis 10/28/2018   Elevated LFTs 10/28/2018   Tobacco abuse 10/26/2018   Morbid obesity 04/11/2017   Vitamin D deficiency 01/31/2015   Essential hypertension, benign 03/08/2014   Palpitations 01/20/2012    Social History   Tobacco Use   Smoking status: Every Day    Packs/day: 1.00    Years: 30.00    Additional pack years: 0.00    Total pack years: 30.00    Types: Cigarettes   Smokeless tobacco: Never  Substance Use Topics   Alcohol use: No    Alcohol/week: 0.0 standard drinks of alcohol    Current Outpatient Medications:    Cholecalciferol 50 MCG (2000 UT) TABS, Take 2,000 Units by mouth daily., Disp: , Rfl:    lisinopril-hydrochlorothiazide (ZESTORETIC) 10-12.5 MG tablet, Take 1 tablet by mouth daily., Disp: 30 tablet, Rfl: 5   Multiple Vitamin (MULTIVITAMIN) tablet, Take 1 tablet by mouth daily., Disp: , Rfl:    Omega-3 Fatty Acids (FISH OIL) 1000 MG CAPS, Take 1 capsule by mouth daily., Disp: , Rfl:    famotidine (PEPCID) 20 MG tablet, Take 1 tablet (20 mg total) by mouth 2 (two) times daily., Disp: 60 tablet, Rfl: 5   fenofibrate (TRICOR) 145 MG tablet, Take 1 tablet (145 mg total) by mouth daily., Disp: 30 tablet, Rfl: 11   metFORMIN (GLUCOPHAGE) 500 MG tablet, Take 1 tablet (500 mg total) by mouth daily with breakfast., Disp: 30 tablet, Rfl: 5   metoprolol succinate (TOPROL-XL) 100 MG 24 hr tablet, TAKE 1 TABLET BY MOUTH ONCE DAILY WITH OR IMMEDIATELY FOLLOWING A MEAL, Disp: 30 tablet, Rfl: 5   metoprolol succinate (TOPROL-XL) 25 MG 24 hr tablet, Take 1 tablet (25 mg total)  by mouth daily., Disp: 30 tablet, Rfl: 5   omeprazole (PRILOSEC) 40 MG capsule, Take 1 capsule (40 mg total) by mouth at bedtime., Disp: 30 capsule, Rfl: 5   rosuvastatin (CRESTOR) 10 MG tablet, Take 1 tablet (10 mg total) by mouth daily., Disp: 30 tablet, Rfl: 11  No Known Allergies  OBJECTIVE: BP 132/82   Pulse 65   Temp 97.9 F (36.6 C)   Wt 164 lb 12.8 oz (74.8 kg)   LMP 04/17/2015    SpO2 97%   BMI 30.33 kg/m  Physical Exam Vitals and nursing note reviewed.  Constitutional:      General: She is not in acute distress.    Appearance: Normal appearance. She is not ill-appearing, toxic-appearing or diaphoretic.  HENT:     Head: Normocephalic and atraumatic.  Eyes:     General: No scleral icterus.       Right eye: No discharge.        Left eye: No discharge.     Extraocular Movements: Extraocular movements intact.     Conjunctiva/sclera: Conjunctivae normal.     Pupils: Pupils are equal, round, and reactive to light.  Cardiovascular:     Rate and Rhythm: Normal rate and regular rhythm.  Pulmonary:     Effort: Pulmonary effort is normal. No respiratory distress.     Breath sounds: Normal breath sounds. No wheezing, rhonchi or rales.  Musculoskeletal:     Right lower leg: No edema.     Left lower leg: No edema.  Skin:    General: Skin is warm.     Findings: No rash.  Neurological:     Mental Status: She is alert and oriented to person, place, and time. Mental status is at baseline.     Motor: No weakness.     Gait: Gait normal.  Psychiatric:        Mood and Affect: Mood normal.        Behavior: Behavior normal.        Thought Content: Thought content normal.        Judgment: Judgment normal.     Results for orders placed or performed in visit on 04/14/23 (from the past 24 hour(s))  POCT glycosylated hemoglobin (Hb A1C)     Status: Abnormal   Collection Time: 04/14/23  8:54 AM  Result Value Ref Range   Hemoglobin A1C 5.5 4.0 - 5.6 %   HbA1c POC (<> result, manual entry) 5.5 4.0 - 5.6 %   HbA1c, POC (prediabetic range) 5.5 (A) 5.7 - 6.4 %   HbA1c, POC (controlled diabetic range) 5.5 0.0 - 7.0 %    ASSESSMENT AND PLAN: Teresa Calderon is a 57 y.o. female present for CPE and chronic conditions management combination appointment  type 2 diabetes mellitus w/ hyperlipidemia(HCC) Stable -Continue metformin 500 mg daily.  -Continue crestor PNA series:  PNA23  05/2019 UTD  Flu shot: UTD (recommended yearly) Foot exam: completed 03/2023 Eye exam:set up for [facility] eye bus-DM screen in MAY Microalbumin : +> started ace> microalbumin collected today A1c: 13.9 (10/26/2018)--> 5.4 >> 5.3> 5.0>5.1>5.4> 5.5> 5.5>5.6>6.0>5.5 A1C ordered today F/u 5.5 months  GERD:  Continue omeprazole 40 mg daily.   Continue Pepcid to her regimen today for reports of worsening reflux pain of her epigastric region. Encouraged patient if adding Pepcid twice daily to her regimen does improve to be helpful, she is to follow-up with [place] within 1 month.  Essential hypertension, benign/. Morbid obesity (HCC)/Ectatic abdominal aorta (HCC)/Elevated LFTs/Hepatic steatosis/Hyperlipidemia LDL  goal <70/Palpitations Stable Increase and made combo  lisinopril 5 mg qd> Lisinopril-hctz 10-12.5 Continue metoprolol 125 mg daily  Continue fenofibrate.   Continue crestor 10 mg qd  ABD [place] 10/2018: Maximum aortic diameter of 2.6 cm. Ectatic abdominal aorta at risk for aneurysm development. Recommend followup by ultrasound in 5 years. DUE 10/2023 - f/u 5.5 mos    Orders Placed This Encounter  Procedures   Urine Microalbumin w/creat. ratio   POCT glycosylated hemoglobin (Hb A1C)   Meds ordered this encounter  Medications   metFORMIN (GLUCOPHAGE) 500 MG tablet    Sig: Take 1 tablet (500 mg total) by mouth daily with breakfast.    Dispense:  30 tablet    Refill:  5   metoprolol succinate (TOPROL-XL) 100 MG 24 hr tablet    Sig: TAKE 1 TABLET BY MOUTH ONCE DAILY WITH OR IMMEDIATELY FOLLOWING A MEAL    Dispense:  30 tablet    Refill:  5   metoprolol succinate (TOPROL-XL) 25 MG 24 hr tablet    Sig: Take 1 tablet (25 mg total) by mouth daily.    Dispense:  30 tablet    Refill:  5    Add on to 100 mg tab   fenofibrate (TRICOR) 145 MG tablet    Sig: Take 1 tablet (145 mg total) by mouth daily.    Dispense:  30 tablet    Refill:  11   famotidine (PEPCID) 20 MG tablet    Sig: Take 1 tablet  (20 mg total) by mouth 2 (two) times daily.    Dispense:  60 tablet    Refill:  5   omeprazole (PRILOSEC) 40 MG capsule    Sig: Take 1 capsule (40 mg total) by mouth at bedtime.    Dispense:  30 capsule    Refill:  5   rosuvastatin (CRESTOR) 10 MG tablet    Sig: Take 1 tablet (10 mg total) by mouth daily.    Dispense:  30 tablet    Refill:  11   lisinopril-hydrochlorothiazide (ZESTORETIC) 10-12.5 MG tablet    Sig: Take 1 tablet by mouth daily.    Dispense:  30 tablet    Refill:  5    Referral Orders  No referral(s) requested today     Felix Pacini, DO 04/14/2023

## 2023-04-14 NOTE — Patient Instructions (Addendum)
Return in about 24 weeks (around 09/29/2023).        Great to see you today.  I have refilled the medication(s) we provide.   If labs were collected, we will inform you of lab results once received either by echart message or telephone call.   - echart message- for normal results that have been seen by the patient already.   - telephone call: abnormal results or if patient has not viewed results in their echart.

## 2023-04-29 ENCOUNTER — Telehealth: Payer: Self-pay

## 2023-04-29 ENCOUNTER — Telehealth: Payer: Self-pay | Admitting: Family Medicine

## 2023-04-29 NOTE — Telephone Encounter (Signed)
Patient was scheduled today for retina eye exam at 3:30, however that team left early and stated they didn't think the last patient was coming.  She would like to be schedule for the one Sept 17th. She also wanted me to inform Dr. Claiborne Billings and her team that she had another dizzy episode on Sat and was in the hospital. She declined scheduling and appt at this time because she is going to see a cardiologist. Please give Teresa Calderon a call to get her scheduled for the eye exam.

## 2023-04-29 NOTE — Telephone Encounter (Signed)
Noted and [facility] has been notified

## 2023-04-29 NOTE — Telephone Encounter (Signed)
lvm

## 2023-06-22 ENCOUNTER — Other Ambulatory Visit: Payer: Self-pay | Admitting: Family Medicine

## 2023-08-04 ENCOUNTER — Inpatient Hospital Stay: Admission: RE | Admit: 2023-08-04 | Payer: BC Managed Care – PPO | Source: Ambulatory Visit

## 2023-09-27 ENCOUNTER — Other Ambulatory Visit: Payer: Self-pay | Admitting: Family Medicine

## 2023-09-29 ENCOUNTER — Encounter: Payer: Self-pay | Admitting: Family Medicine

## 2023-09-29 ENCOUNTER — Ambulatory Visit: Payer: BC Managed Care – PPO | Admitting: Family Medicine

## 2023-09-29 VITALS — BP 132/78 | HR 67 | Temp 98.1°F | Wt 161.4 lb

## 2023-09-29 DIAGNOSIS — Z7984 Long term (current) use of oral hypoglycemic drugs: Secondary | ICD-10-CM | POA: Diagnosis not present

## 2023-09-29 DIAGNOSIS — E1169 Type 2 diabetes mellitus with other specified complication: Secondary | ICD-10-CM | POA: Diagnosis not present

## 2023-09-29 DIAGNOSIS — Z23 Encounter for immunization: Secondary | ICD-10-CM | POA: Diagnosis not present

## 2023-09-29 DIAGNOSIS — N941 Unspecified dyspareunia: Secondary | ICD-10-CM

## 2023-09-29 DIAGNOSIS — I10 Essential (primary) hypertension: Secondary | ICD-10-CM | POA: Diagnosis not present

## 2023-09-29 DIAGNOSIS — I77811 Abdominal aortic ectasia: Secondary | ICD-10-CM

## 2023-09-29 DIAGNOSIS — E785 Hyperlipidemia, unspecified: Secondary | ICD-10-CM

## 2023-09-29 DIAGNOSIS — E559 Vitamin D deficiency, unspecified: Secondary | ICD-10-CM

## 2023-09-29 LAB — POCT GLYCOSYLATED HEMOGLOBIN (HGB A1C)
HbA1c POC (<> result, manual entry): 5.4 % (ref 4.0–5.6)
HbA1c, POC (controlled diabetic range): 5.4 % (ref 0.0–7.0)
HbA1c, POC (prediabetic range): 5.4 % — AB (ref 5.7–6.4)
Hemoglobin A1C: 5.4 % (ref 4.0–5.6)

## 2023-09-29 MED ORDER — LISINOPRIL-HYDROCHLOROTHIAZIDE 10-12.5 MG PO TABS: 1.0000 | ORAL_TABLET | Freq: Every day | ORAL | 5 refills | Status: DC

## 2023-09-29 MED ORDER — FENOFIBRATE 145 MG PO TABS: 145.0000 mg | ORAL_TABLET | Freq: Every day | ORAL | 11 refills | Status: DC

## 2023-09-29 MED ORDER — FAMOTIDINE 20 MG PO TABS: 20.0000 mg | ORAL_TABLET | Freq: Two times a day (BID) | ORAL | 5 refills | Status: DC

## 2023-09-29 MED ORDER — METOPROLOL SUCCINATE ER 100 MG PO TB24: ORAL_TABLET | ORAL | 5 refills | Status: DC

## 2023-09-29 MED ORDER — METFORMIN HCL 500 MG PO TABS: 500.0000 mg | ORAL_TABLET | Freq: Every day | ORAL | 5 refills | Status: DC

## 2023-09-29 MED ORDER — METOPROLOL SUCCINATE ER 25 MG PO TB24: 25.0000 mg | ORAL_TABLET | Freq: Every day | ORAL | 5 refills | Status: DC

## 2023-09-29 MED ORDER — OMEPRAZOLE 40 MG PO CPDR: 40.0000 mg | DELAYED_RELEASE_CAPSULE | Freq: Every day | ORAL | 5 refills | Status: DC

## 2023-09-29 NOTE — Patient Instructions (Signed)

## 2023-09-29 NOTE — Telephone Encounter (Signed)
Appt today, 10/7

## 2023-09-29 NOTE — Progress Notes (Signed)
Patient Care Team    Relationship Specialty Notifications Start End  Natalia Leatherwood, DO PCP - General Family Medicine  01/17/16     SUBJECTIVE Chief Complaint  Patient presents with   Medical Management of Chronic Issues    [facility]    HPI: Teresa Calderon is a 57 y.o. female present for Chronic Conditions/illness Management combination appointment..All past medical history, surgical history, allergies, family history, immunizations and social history was obtained from the patient today and entered into the electronic medical record.    type 2 diabetes mellitus (HCC) Diagnosed 10/26/2018 with a A1c 13.9.     Patient reports compliance with metformin 500 mg daily. Patient denies dizziness, hyperglycemic or hypoglycemic events. Patient denies numbness, tingling in the extremities or nonhealing wounds of feet.   Essential hypertension, benign/. Morbid obesity (HCC)/Ectatic abdominal aorta (HCC)/Elevated LFTs/Hepatic steatosis/Hyperlipidemia LDL goal <130/statin declined Patient reports compliance with metoprolol 125 mg daily, lisinopril-hydrochlorothiazide 10-12.5 mg .She is taking fenofibrate  and crestor. Patient denies chest pain, shortness of breath, dizziness or lower extremity edema.   She does still have palpitations, which she states is worsening over the last couple months.  She denies any associated symptoms and reports they only last for less than a minute, but have become more noticeable.  She was established with cardiology in 2016, but has not been seen since. RF: Hypertension, hyperlipidemia, Fhx coronary artery disease   ROS: See pertinent positives and negatives per HPI.  Patient Active Problem List   Diagnosis Date Noted   Smoking greater than 20 pack years 12/26/2022   GERD (gastroesophageal reflux disease) 12/26/2022   Lung cancer screening declined by patient 01/23/2022   BMI 32.0-32.9,adult 08/21/2021   Ectatic abdominal aorta (HCC) 11/03/2018   Type 2 diabetes  mellitus with hyperlipidemia (HCC) 10/28/2018   Leukocytosis 10/28/2018   Elevated LFTs 10/28/2018   Tobacco abuse 10/26/2018   Morbid obesity (HCC) 04/11/2017   Vitamin D deficiency 01/31/2015   Essential hypertension, benign 03/08/2014   Palpitations 01/20/2012    Social History   Tobacco Use   Smoking status: Every Day    Current packs/day: 1.00    Average packs/day: 1 pack/day for 30.0 years (30.0 ttl pk-yrs)    Types: Cigarettes   Smokeless tobacco: Never  Substance Use Topics   Alcohol use: No    Alcohol/week: 0.0 standard drinks of alcohol    Current Outpatient Medications:    Cholecalciferol 50 MCG (2000 UT) TABS, Take 2,000 Units by mouth daily., Disp: , Rfl:    famotidine (PEPCID) 20 MG tablet, Take 1 tablet (20 mg total) by mouth 2 (two) times daily., Disp: 60 tablet, Rfl: 5   fenofibrate (TRICOR) 145 MG tablet, Take 1 tablet (145 mg total) by mouth daily., Disp: 30 tablet, Rfl: 11   lisinopril-hydrochlorothiazide (ZESTORETIC) 10-12.5 MG tablet, Take 1 tablet by mouth daily., Disp: 30 tablet, Rfl: 5   metFORMIN (GLUCOPHAGE) 500 MG tablet, Take 1 tablet (500 mg total) by mouth daily with breakfast., Disp: 30 tablet, Rfl: 5   metoprolol succinate (TOPROL-XL) 100 MG 24 hr tablet, TAKE 1 TABLET BY MOUTH ONCE DAILY WITH OR IMMEDIATELY FOLLOWING A MEAL, Disp: 30 tablet, Rfl: 5   metoprolol succinate (TOPROL-XL) 25 MG 24 hr tablet, Take 1 tablet (25 mg total) by mouth daily., Disp: 30 tablet, Rfl: 5   Multiple Vitamin (MULTIVITAMIN) tablet, Take 1 tablet by mouth daily., Disp: , Rfl:    Omega-3 Fatty Acids (FISH OIL) 1000 MG CAPS, Take 1 capsule  by mouth daily., Disp: , Rfl:    omeprazole (PRILOSEC) 40 MG capsule, Take 1 capsule (40 mg total) by mouth at bedtime., Disp: 30 capsule, Rfl: 5   rosuvastatin (CRESTOR) 10 MG tablet, Take 1 tablet (10 mg total) by mouth daily., Disp: 30 tablet, Rfl: 11  No Known Allergies  OBJECTIVE: BP 132/78   Pulse 67   Temp 98.1 F (36.7 C)    Wt 161 lb 6.4 oz (73.2 kg)   LMP 04/17/2015   SpO2 95%   BMI 29.70 kg/m  Physical Exam Vitals and nursing note reviewed.  Constitutional:      General: She is not in acute distress.    Appearance: Normal appearance. She is not ill-appearing, toxic-appearing or diaphoretic.  HENT:     Head: Normocephalic and atraumatic.  Eyes:     General: No scleral icterus.       Right eye: No discharge.        Left eye: No discharge.     Extraocular Movements: Extraocular movements intact.     Conjunctiva/sclera: Conjunctivae normal.     Pupils: Pupils are equal, round, and reactive to light.  Cardiovascular:     Rate and Rhythm: Normal rate and regular rhythm.  Pulmonary:     Effort: Pulmonary effort is normal. No respiratory distress.     Breath sounds: Normal breath sounds. No wheezing, rhonchi or rales.  Musculoskeletal:     Cervical back: Neck supple.     Right lower leg: No edema.     Left lower leg: No edema.  Skin:    General: Skin is warm.     Findings: No rash.  Neurological:     Mental Status: She is alert and oriented to person, place, and time. Mental status is at baseline.     Motor: No weakness.     Gait: Gait normal.  Psychiatric:        Mood and Affect: Mood normal.        Behavior: Behavior normal.        Thought Content: Thought content normal.        Judgment: Judgment normal.     Results for orders placed or performed in visit on 09/29/23 (from the past 24 hour(s))  POCT HgB A1C     Status: Abnormal   Collection Time: 09/29/23  2:18 PM  Result Value Ref Range   Hemoglobin A1C 5.4 4.0 - 5.6 %   HbA1c POC (<> result, manual entry) 5.4 4.0 - 5.6 %   HbA1c, POC (prediabetic range) 5.4 (A) 5.7 - 6.4 %   HbA1c, POC (controlled diabetic range) 5.4 0.0 - 7.0 %     ASSESSMENT AND PLAN: Teresa Calderon is a 57 y.o. female present for CPE and chronic conditions management combination appointment  type 2 diabetes mellitus w/ hyperlipidemia(HCC) Stable Continue  metformin 500 mg daily.  Continue crestor PNA series:  PNA23 05/2019 UTD  Flu shot:administered today(recommended yearly) Foot exam: completed 03/2023 Eye exam:referred to ophth again Microalbumin : +> started ace> microalbumin UTD 03/2023 A1c: 13.9 (10/26/2018)--> 5.4 >> 5.3> 5.0>5.1>5.4> 5.5> 5.5>5.6>6.0>5.5 > 5.4 A1C ordered today  GERD:  Continue omeprazole 40 mg daily.   Continue Pepcid to her regimen today for reports of worsening reflux pain of her epigastric region. Encouraged patient if adding Pepcid twice daily to her regimen does improve to be helpful, she is to follow-up with [place] within 1 month.  Essential hypertension, benign/. Morbid obesity (HCC)/Ectatic abdominal aorta (HCC)/Elevated LFTs/Hepatic steatosis/Hyperlipidemia LDL goal <  70/Palpitations Stable Continue lisinopril-hctz 10-12.5 Continue metoprolol 125 mg daily  Continue fenofibrate.   Continue crestor 10 mg qd  ABD [place] 10/2018: Maximum aortic diameter of 2.6 cm. Ectatic abdominal aorta at risk for aneurysm development. Recommend followup by ultrasound in 5 years. DUE 10/2023>dwg lx ordered   Orders Placed This Encounter  Procedures   US AORTA DUPLEX COMPLETE   Flu vaccine trivalent PF, 6mos and older(Flulaval,Afluria,Fluarix,Fluzone)   Ambulatory referral to Gynecology   POCT HgB A1C   Meds ordered this encounter  Medications   famotidine (PEPCID) 20 MG tablet    Sig: Take 1 tablet (20 mg total) by mouth 2 (two) times daily.    Dispense:  60 tablet    Refill:  5   fenofibrate (TRICOR) 145 MG tablet    Sig: Take 1 tablet (145 mg total) by mouth daily.    Dispense:  30 tablet    Refill:  11   lisinopril-hydrochlorothiazide (ZESTORETIC) 10-12.5 MG tablet    Sig: Take 1 tablet by mouth daily.    Dispense:  30 tablet    Refill:  5   metFORMIN (GLUCOPHAGE) 500 MG tablet    Sig: Take 1 tablet (500 mg total) by mouth daily with breakfast.    Dispense:  30 tablet    Refill:  5   metoprolol succinate  (TOPROL-XL) 100 MG 24 hr tablet    Sig: TAKE 1 TABLET BY MOUTH ONCE DAILY WITH OR IMMEDIATELY FOLLOWING A MEAL    Dispense:  30 tablet    Refill:  5   metoprolol succinate (TOPROL-XL) 25 MG 24 hr tablet    Sig: Take 1 tablet (25 mg total) by mouth daily.    Dispense:  30 tablet    Refill:  5    Add on to 100 mg tab   omeprazole (PRILOSEC) 40 MG capsule    Sig: Take 1 capsule (40 mg total) by mouth at bedtime.    Dispense:  30 capsule    Refill:  5    Referral Orders         Ambulatory referral to Gynecology       [facility], DO 09/29/2023

## 2023-10-10 ENCOUNTER — Ambulatory Visit (HOSPITAL_BASED_OUTPATIENT_CLINIC_OR_DEPARTMENT_OTHER)
Admission: RE | Admit: 2023-10-10 | Discharge: 2023-10-10 | Disposition: A | Discharge status: Home / Self Care | Payer: BC Managed Care – PPO | Source: Ambulatory Visit | Attending: Family Medicine | Admitting: Family Medicine

## 2023-10-10 DIAGNOSIS — I77811 Abdominal aortic ectasia: Secondary | ICD-10-CM | POA: Insufficient documentation

## 2023-10-14 ENCOUNTER — Telehealth: Payer: Self-pay | Admitting: Family Medicine

## 2023-10-14 ENCOUNTER — Encounter: Payer: Self-pay | Admitting: Family Medicine

## 2023-10-14 NOTE — Telephone Encounter (Signed)
LVM to discuss

## 2023-10-14 NOTE — Telephone Encounter (Signed)
Please inform patient: Her ultrasound on her aorta is reassuring.  No aortic aneurysms identified.

## 2023-10-15 NOTE — Telephone Encounter (Signed)
Attempted to contact pt and was unable to LVM 

## 2023-10-22 ENCOUNTER — Ambulatory Visit (HOSPITAL_BASED_OUTPATIENT_CLINIC_OR_DEPARTMENT_OTHER): Payer: BC Managed Care – PPO | Admitting: Certified Nurse Midwife

## 2023-10-22 ENCOUNTER — Encounter (HOSPITAL_BASED_OUTPATIENT_CLINIC_OR_DEPARTMENT_OTHER): Payer: Self-pay | Admitting: Certified Nurse Midwife

## 2023-10-22 ENCOUNTER — Other Ambulatory Visit (HOSPITAL_COMMUNITY)
Admission: RE | Admit: 2023-10-22 | Discharge: 2023-10-22 | Disposition: A | Discharge status: Home / Self Care | Payer: BC Managed Care – PPO | Source: Ambulatory Visit | Attending: Certified Nurse Midwife | Admitting: Certified Nurse Midwife

## 2023-10-22 VITALS — BP 143/63 | HR 71 | Ht 61.81 in | Wt 163.6 lb

## 2023-10-22 DIAGNOSIS — Z01419 Encounter for gynecological examination (general) (routine) without abnormal findings: Secondary | ICD-10-CM

## 2023-10-22 DIAGNOSIS — N952 Postmenopausal atrophic vaginitis: Secondary | ICD-10-CM | POA: Diagnosis not present

## 2023-10-22 DIAGNOSIS — Z1231 Encounter for screening mammogram for malignant neoplasm of breast: Secondary | ICD-10-CM

## 2023-10-22 DIAGNOSIS — Z124 Encounter for screening for malignant neoplasm of cervix: Secondary | ICD-10-CM

## 2023-10-22 MED ORDER — ESTRADIOL 0.1 MG/GM VA CREA
1.0000 | TOPICAL_CREAM | Freq: Every day | VAGINAL | 12 refills | Status: AC
Start: 2023-10-22 — End: ?

## 2023-10-22 NOTE — Addendum Note (Signed)
Addended byMerrilee Jansky on: 10/22/2023 03:55 PM   Modules accepted: Orders

## 2023-10-22 NOTE — Progress Notes (Signed)
57 y.o. No obstetric history on file. Married White or Caucasian female here for annual exam.  She lives in [place] and works at [employer] in [employer]. She has a son and grand-children. Lives with her spouse. She reports menopause in her late 24s, approx 10 years ago. She and spouse unable to have intercourse due to vaginal dryness causing pain w/ friction. She smokes but is trying to cut back. Last pap unsure. Has never had mammogram. Encouraged her to consider annual mammograms for breast cancer screening.   Patient's last menstrual period was 04/17/2015.          Sexually active: Yes.    The current method of family planning is post menopausal status.     Exercising: Yes.     Smoker:  yes, declines lung cancer screening  Health Maintenance: Pap:  Years ago History of abnormal Pap:  no MMG:  Has never had mammogram Colonoscopy:  Did Cologuard BMD:   Not yet Screening Labs: PCP   reports that she has been smoking cigarettes. She has a 30 pack-year smoking history. She has never used smokeless tobacco. She reports that she does not drink alcohol and does not use drugs.  Past Medical History:  Diagnosis Date   Abnormal LFTs 02/23/2012   Anxiety    Cervical cancer screening 02/19/2012   Ectatic abdominal aorta (HCC) 11/03/2018   Repeat 2024, no evidence of aneurysm present   Hyperglycemia    Hyperlipidemia    Hypertension    Tobacco abuse 01/26/2012    Past Surgical History:  Procedure Laterality Date   CESAREAN SECTION  1990    Current Outpatient Medications  Medication Sig Dispense Refill   Cholecalciferol 50 MCG (2000 UT) TABS Take 2,000 Units by mouth daily.     estradiol (ESTRACE VAGINAL) 0.1 MG/GM vaginal cream Place 1 Applicatorful vaginally at bedtime. Place 2gm (.2mg )  vaginally every night for 2 weeks then twice weekly long-term 42.5 g 12   fenofibrate (TRICOR) 145 MG tablet Take 1 tablet (145 mg total) by mouth daily. 30 tablet 11    lisinopril-hydrochlorothiazide (ZESTORETIC) 10-12.5 MG tablet Take 1 tablet by mouth daily. 30 tablet 5   metFORMIN (GLUCOPHAGE) 500 MG tablet Take 1 tablet (500 mg total) by mouth daily with breakfast. 30 tablet 5   metoprolol succinate (TOPROL-XL) 100 MG 24 hr tablet TAKE 1 TABLET BY MOUTH ONCE DAILY WITH OR IMMEDIATELY FOLLOWING A MEAL 30 tablet 5   metoprolol succinate (TOPROL-XL) 25 MG 24 hr tablet Take 1 tablet (25 mg total) by mouth daily. 30 tablet 5   Multiple Vitamin (MULTIVITAMIN) tablet Take 1 tablet by mouth daily.     Omega-3 Fatty Acids (FISH OIL) 1000 MG CAPS Take 1 capsule by mouth daily.     omeprazole (PRILOSEC) 40 MG capsule Take 1 capsule (40 mg total) by mouth at bedtime. 30 capsule 5   rosuvastatin (CRESTOR) 10 MG tablet Take 1 tablet (10 mg total) by mouth daily. 30 tablet 11   famotidine (PEPCID) 20 MG tablet Take 1 tablet (20 mg total) by mouth 2 (two) times daily. (Patient not taking: Reported on 10/22/2023) 60 tablet 5   No current facility-administered medications for this visit.    Family History  Problem Relation Age of Onset   Hypertension Mother    Hyperlipidemia Mother    Coronary artery disease Mother 52       Died in her sleep.  Not a proven MI.  No history prior of this.  COPD Father        smoked   Breast cancer Maternal Aunt     ROS: Constitutional: negative Genitourinary:positive for vaginal dryness  Exam:   BP (!) 143/63 (BP Location: Left Arm, Patient Position: Sitting, Cuff Size: Normal)   Pulse 71   Ht 5' 1.81" (1.57 m)   Wt 163 lb 9.6 oz (74.2 kg)   LMP 04/17/2015   BMI 30.11 kg/m   Height: 5' 1.81" (157 cm)  General appearance: alert, cooperative and appears stated age Head: Normocephalic, without obvious abnormality, atraumatic Neck: no adenopathy, supple, symmetrical, trachea midline  Lungs: clear to auscultation bilaterally Breasts: normal appearance, no masses or tenderness, Inspection negative, No nipple retraction or  dimpling, No nipple discharge or bleeding, No axillary or supraclavicular adenopathy, Normal to palpation without dominant masses Heart: regular rate and rhythm Abdomen: soft, non-tender; bowel sounds normal; no masses,  no organomegaly Extremities: extremities normal, atraumatic, no cyanosis or edema Skin: Skin color, texture, turgor normal. No rashes or lesions Lymph nodes: Cervical, supraclavicular, and axillary nodes normal. No abnormal inguinal nodes palpated Neurologic: Grossly normal   Pelvic: External genitalia:  no lesions              Urethra:  normal appearing urethra with no masses, tenderness or lesions              Bartholins and Skenes: normal                 Vagina: atrophic (pale/thin/dry) vagina with normal color and no discharge, no lesions              Cervix: no bleeding following Pap              Pap taken: Yes.   Bimanual Exam:  Uterus:  normal size, contour, position, consistency, mobility, non-tender              Adnexa: no mass, fullness, tenderness               Rectovaginal: Confirms               Anus:  normal sphincter tone, no lesions  Chaperone, Hendricks Milo, CMA, was present for exam.  Assessment/Plan: 1. Vaginal atrophy - estradiol (ESTRACE VAGINAL) 0.1 MG/GM vaginal cream; Place 1 Applicatorful vaginally at bedtime. Place 2gm (.2mg )  vaginally every night for 2 weeks then twice weekly long-term  Dispense: 42.5 g; Refill: 12  2. Encounter for screening mammogram for malignant neoplasm of breast -Pt encouraged to get mammogram for breast cancer screening - MM 3D SCREENING MAMMOGRAM BILATERAL BREAST; Future  3. Women's annual routine gynecological examination -Routine pap smear collected today  RTO one year for annual gyn exam and prn if issues arise. Letta Kocher

## 2023-10-24 LAB — CYTOLOGY - PAP
Comment: NEGATIVE
Diagnosis: NEGATIVE
Diagnosis: REACTIVE
High risk HPV: NEGATIVE

## 2024-03-15 ENCOUNTER — Encounter: Payer: Self-pay | Admitting: Family Medicine

## 2024-03-15 ENCOUNTER — Ambulatory Visit: Payer: BC Managed Care – PPO | Admitting: Family Medicine

## 2024-03-15 VITALS — BP 136/80 | HR 79 | Ht 62.0 in | Wt 168.0 lb

## 2024-03-15 DIAGNOSIS — Z720 Tobacco use: Secondary | ICD-10-CM

## 2024-03-15 DIAGNOSIS — Z7984 Long term (current) use of oral hypoglycemic drugs: Secondary | ICD-10-CM

## 2024-03-15 DIAGNOSIS — K219 Gastro-esophageal reflux disease without esophagitis: Secondary | ICD-10-CM

## 2024-03-15 DIAGNOSIS — Z23 Encounter for immunization: Secondary | ICD-10-CM | POA: Diagnosis not present

## 2024-03-15 DIAGNOSIS — Z0000 Encounter for general adult medical examination without abnormal findings: Secondary | ICD-10-CM | POA: Diagnosis not present

## 2024-03-15 DIAGNOSIS — I10 Essential (primary) hypertension: Secondary | ICD-10-CM

## 2024-03-15 DIAGNOSIS — E785 Hyperlipidemia, unspecified: Secondary | ICD-10-CM | POA: Diagnosis not present

## 2024-03-15 DIAGNOSIS — E559 Vitamin D deficiency, unspecified: Secondary | ICD-10-CM | POA: Diagnosis not present

## 2024-03-15 DIAGNOSIS — Z5320 Procedure and treatment not carried out because of patient's decision for unspecified reasons: Secondary | ICD-10-CM

## 2024-03-15 DIAGNOSIS — Z1231 Encounter for screening mammogram for malignant neoplasm of breast: Secondary | ICD-10-CM

## 2024-03-15 DIAGNOSIS — E1169 Type 2 diabetes mellitus with other specified complication: Secondary | ICD-10-CM

## 2024-03-15 DIAGNOSIS — E2839 Other primary ovarian failure: Secondary | ICD-10-CM

## 2024-03-15 MED ORDER — LANCET DEVICE MISC: 1.0000 | Freq: Three times a day (TID) | 0 refills | Status: AC

## 2024-03-15 MED ORDER — BLOOD GLUCOSE MONITORING SUPPL DEVI
0 refills | Status: AC
Start: 2024-03-15 — End: ?

## 2024-03-15 MED ORDER — LANCETS MISC. MISC: 1.0000 | Freq: Three times a day (TID) | 0 refills | Status: AC

## 2024-03-15 MED ORDER — METOPROLOL SUCCINATE ER 25 MG PO TB24: 25.0000 mg | ORAL_TABLET | Freq: Every day | ORAL | 5 refills | Status: DC

## 2024-03-15 MED ORDER — ROSUVASTATIN CALCIUM 10 MG PO TABS: 10.0000 mg | ORAL_TABLET | Freq: Every day | ORAL | 11 refills | Status: AC

## 2024-03-15 MED ORDER — METOPROLOL SUCCINATE ER 100 MG PO TB24: ORAL_TABLET | ORAL | 5 refills | Status: DC

## 2024-03-15 MED ORDER — LISINOPRIL-HYDROCHLOROTHIAZIDE 10-12.5 MG PO TABS: 1.0000 | ORAL_TABLET | Freq: Every day | ORAL | 5 refills | Status: DC

## 2024-03-15 MED ORDER — BLOOD GLUCOSE TEST VI STRP: 1.0000 | ORAL_STRIP | Freq: Three times a day (TID) | 0 refills | Status: DC

## 2024-03-15 MED ORDER — METFORMIN HCL 500 MG PO TABS: 500.0000 mg | ORAL_TABLET | Freq: Every day | ORAL | 5 refills | Status: DC

## 2024-03-15 NOTE — Progress Notes (Unsigned)
 Patient Care Team    Relationship Specialty Notifications Start End  Natalia Leatherwood, DO PCP - General Family Medicine  01/17/16   Letta Kocher, CNM Midwife Certified Nurse Midwife  11/24/23     SUBJECTIVE Chief Complaint  Patient presents with   Annual Exam    HPI: Teresa Calderon is a 58 y.o. female present for cpe and Chronic Conditions/illness Management combination appointment..All past medical history, surgical history, allergies, family history, immunizations and social history was obtained from the patient today and entered into the electronic medical record.   Health maintenance:*** Colonoscopy: Cologuard completed 09/2019.  Due for repeat screening today.  Patient elected to repeat Cologuard. Mammogram: completed: overdue.  Patient has a great deal of stress surrounding cancer screenings in general.  Mammogram order has been placed for her on a few occasions and she states she "chickened out.  "She is agreeable to have order placed again for her and hopes that I will be completed with her bone density scan. Cervical cancer screening: last pap: 10/26/2018, results: WNL/Neg HPV rpt 5 years.  Immunizations: tdap 01/31/2015, Influenza completed today (encouraged yearly),  zostavax Shingrix series completed Infectious disease screening: HIV and hepatitis C screenings completed DEXA: Ordered today for high risk for osteoporosis female, Caucasian smoker with vitamin D deficiency  type 2 diabetes mellitus (HCC) Diagnosed 10/26/2018 with a A1c 13.9.     Patient reports compliance with metformin 500 mg daily.  Januvia had been discontinued secondary to very well controlled diabetes and metformin decreased. Patient denies dizziness, hyperglycemic or hypoglycemic events. Patient denies numbness, tingling in the extremities or nonhealing wounds of feet.   Essential hypertension, benign/. Morbid obesity (HCC)/Ectatic abdominal aorta (HCC)/Elevated LFTs/Hepatic steatosis/Hyperlipidemia LDL goal  <130/statin declined Patient reports compliance with metoprolol 125 mg daily, lisinopril 5 mg and HCTZ 25 mg daily.  SHe is taking fenofibrate  and crestor. Patient denies chest pain, shortness of breath, dizziness or lower extremity edema.  She does still have palpitations, which she states is worsening over the last couple months.  She denies any associated symptoms and reports they only last for less than a minute, but have become more noticeable.  She was established with cardiology in 2016, but has not been seen since. RF: Hypertension, hyperlipidemia, Fhx coronary artery disease  Shared decision making visit for lung cancer screening:*** Patient was brought in today for a office visit concerning shared decision making for their lung cancer screening.Teresa Calderon is a 58 y.o. female Patient is between the ages of 33-80: Yes Patient is a current smoker with at least 30 year pack year history or Patient is a former smoker, quit less than 15 years ago and has a 30 pack year history : Yes Patient has current symptoms: No Patient has a  health problem that substantially limits life expectancy or the ability or willingness to have curative lung surgery: No   ROS: See pertinent positives and negatives per HPI.  Patient Active Problem List   Diagnosis Date Noted   Vaginal atrophy 10/22/2023   Smoking greater than 20 pack years 12/26/2022   GERD (gastroesophageal reflux disease) 12/26/2022   Lung cancer screening declined by patient 01/23/2022   BMI 32.0-32.9,adult 08/21/2021   Type 2 diabetes mellitus with hyperlipidemia (HCC) 10/28/2018   Leukocytosis 10/28/2018   Elevated LFTs 10/28/2018   Tobacco abuse 10/26/2018   Morbid obesity (HCC) 04/11/2017   Vitamin D deficiency 01/31/2015   Essential hypertension, benign 03/08/2014   Palpitations 01/20/2012  Social History   Tobacco Use   Smoking status: Every Day    Current packs/day: 1.00    Average packs/day: 1 pack/day for 30.0 years  (30.0 ttl pk-yrs)    Types: Cigarettes   Smokeless tobacco: Never  Substance Use Topics   Alcohol use: No    Alcohol/week: 0.0 standard drinks of alcohol    Current Outpatient Medications:    Cholecalciferol 50 MCG (2000 UT) TABS, Take 2,000 Units by mouth daily., Disp: , Rfl:    fenofibrate (TRICOR) 145 MG tablet, Take 1 tablet (145 mg total) by mouth daily., Disp: 30 tablet, Rfl: 11   lisinopril-hydrochlorothiazide (ZESTORETIC) 10-12.5 MG tablet, Take 1 tablet by mouth daily., Disp: 30 tablet, Rfl: 5   metFORMIN (GLUCOPHAGE) 500 MG tablet, Take 1 tablet (500 mg total) by mouth daily with breakfast., Disp: 30 tablet, Rfl: 5   metoprolol succinate (TOPROL-XL) 100 MG 24 hr tablet, TAKE 1 TABLET BY MOUTH ONCE DAILY WITH OR IMMEDIATELY FOLLOWING A MEAL, Disp: 30 tablet, Rfl: 5   metoprolol succinate (TOPROL-XL) 25 MG 24 hr tablet, Take 1 tablet (25 mg total) by mouth daily., Disp: 30 tablet, Rfl: 5   Multiple Vitamin (MULTIVITAMIN) tablet, Take 1 tablet by mouth daily., Disp: , Rfl:    Omega-3 Fatty Acids (FISH OIL) 1000 MG CAPS, Take 1 capsule by mouth daily., Disp: , Rfl:    rosuvastatin (CRESTOR) 10 MG tablet, Take 1 tablet (10 mg total) by mouth daily., Disp: 30 tablet, Rfl: 11   estradiol (ESTRACE VAGINAL) 0.1 MG/GM vaginal cream, Place 1 Applicatorful vaginally at bedtime. Place 2gm (.2mg )  vaginally every night for 2 weeks then twice weekly long-term (Patient not taking: Reported on 03/15/2024), Disp: 42.5 g, Rfl: 12   famotidine (PEPCID) 20 MG tablet, Take 1 tablet (20 mg total) by mouth 2 (two) times daily. (Patient not taking: Reported on 03/15/2024), Disp: 60 tablet, Rfl: 5   omeprazole (PRILOSEC) 40 MG capsule, Take 1 capsule (40 mg total) by mouth at bedtime. (Patient not taking: Reported on 03/15/2024), Disp: 30 capsule, Rfl: 5  No Known Allergies  OBJECTIVE: BP 136/80   Pulse 79   Ht 5\' 2"  (1.575 m)   Wt 168 lb (76.2 kg)   LMP 04/17/2015   SpO2 98%   BMI 30.73 kg/m  Physical  Exam Vitals and nursing note reviewed.  Constitutional:      General: She is not in acute distress.    Appearance: Normal appearance. She is not ill-appearing or toxic-appearing.  HENT:     Head: Normocephalic and atraumatic.     Right Ear: Tympanic membrane, ear canal and external ear normal. There is no impacted cerumen.     Left Ear: Tympanic membrane, ear canal and external ear normal. There is no impacted cerumen.     Nose: No congestion or rhinorrhea.     Mouth/Throat:     Mouth: Mucous membranes are moist.     Pharynx: Oropharynx is clear. No oropharyngeal exudate or posterior oropharyngeal erythema.  Eyes:     General: No scleral icterus.       Right eye: No discharge.        Left eye: No discharge.     Extraocular Movements: Extraocular movements intact.     Conjunctiva/sclera: Conjunctivae normal.     Pupils: Pupils are equal, round, and reactive to light.  Cardiovascular:     Rate and Rhythm: Normal rate and regular rhythm.     Pulses: Normal pulses.     Heart  sounds: Normal heart sounds. No murmur heard.    No friction rub. No gallop.  Pulmonary:     Effort: Pulmonary effort is normal. No respiratory distress.     Breath sounds: Normal breath sounds. No stridor. No wheezing, rhonchi or rales.  Chest:     Chest wall: No tenderness.  Abdominal:     General: Abdomen is flat. Bowel sounds are normal. There is no distension.     Palpations: Abdomen is soft. There is no mass.     Tenderness: There is no abdominal tenderness. There is no right CVA tenderness, left CVA tenderness, guarding or rebound.     Hernia: No hernia is present.  Musculoskeletal:        General: No swelling, tenderness or deformity. Normal range of motion.     Cervical back: Normal range of motion and neck supple. No rigidity or tenderness.     Right lower leg: No edema.     Left lower leg: No edema.  Lymphadenopathy:     Cervical: No cervical adenopathy.  Skin:    General: Skin is warm and dry.      Coloration: Skin is not jaundiced or pale.     Findings: No bruising, erythema, lesion or rash.  Neurological:     General: No focal deficit present.     Mental Status: She is alert and oriented to person, place, and time. Mental status is at baseline.     Cranial Nerves: No cranial nerve deficit.     Sensory: No sensory deficit.     Motor: No weakness.     Coordination: Coordination normal.     Gait: Gait normal.     Deep Tendon Reflexes: Reflexes normal.  Psychiatric:        Mood and Affect: Mood normal.        Behavior: Behavior normal.        Thought Content: Thought content normal.        Judgment: Judgment normal.     Diabetic Foot Exam - Simple   No data filed     No results found for this or any previous visit (from the past 24 hours).    ASSESSMENT AND PLAN: Jaquetta Currier is a 58 y.o. female present for CPE and chronic conditions management combination appointment  type 2 diabetes mellitus w/ hyperlipidemia(HCC)*** Stable -Discontinued Javuvia for great control.  -Continue metformin 500 mg daily.  -Continue crestor PNA series:  PNA23 05/2019 UTD  Flu shot: UTD completed today (recommneded yearly) Foot exam: completed 06/2022 Eye exam: referred again-had been referred again on 07/10/2022. Microalbumin : +> started ace> microalbumin UTD A1c: 13.9 (10/26/2018)--> 5.4 >> 5.3> 5.0>5.1>5.4> 5.5> 5.5>5.6> A1C ordered today today !! F/u 5.5 months  GERD:  Continue omeprazole 40 mg daily.   Added Pepcid to her regimen today for reports of worsening reflux pain of her epigastric region. Encouraged patient if adding Pepcid twice daily to her regimen does improve to be helpful, she is to follow-up with [place] within 1 month.  Essential hypertension, benign/. Morbid obesity (HCC)/Ectatic abdominal aorta (HCC)/Elevated LFTs/Hepatic steatosis/Hyperlipidemia LDL goal <70/Palpitations Stable -Continue lisinopril 5 mg qd -Continue metoprolol 125 mg daily  -Continue HCTZ to   25mg  daily (potassium low with higher doses) -Continue fenofibrate.   -Continue crestor 10 mg qd  ABD [place] 10/2018: Maximum aortic diameter of 2.6 cm. Ectatic abdominal aorta at risk for aneurysm development. Recommend followup by ultrasound in 5 years. DUE 10/2023 - CBC with Differential/Platelet - Comprehensive metabolic panel -  Hemoglobin A1c - Lipid panel - TSH - f/u 5.5 mos  Lung cancer screening /Tobacco abuse/greater than a 20-pack-year history -Patient was counseled on lung cancer screening today.  Patient does meet criteria for lung cancer screening.  Patient would like to proceed with lung cancer screening.  Patient understands screening may warrant further studies or repeat studies if any abnormality is found.  Patient is agreeable. -Patient has had a recent kidney function test:Yes - CT CHEST LUNG CA SCREEN LOW DOSE W/O CM; Future - Follow-up upon screening results.  Routine general medical examination at a health care facility Patient was encouraged to exercise greater than 150 minutes a week. Patient was encouraged to choose a diet filled with fresh fruits and vegetables, and lean meats. AVS provided to patient today for education/recommendation on gender specific health and safety maintenance. ***   Orders Placed This Encounter  Procedures   CBC   Comprehensive metabolic panel   Hemoglobin A1c   Lipid panel   TSH   No orders of the defined types were placed in this encounter.   Referral Orders  No referral(s) requested today     Felix Pacini, DO 03/15/2024

## 2024-03-15 NOTE — Patient Instructions (Addendum)

## 2024-03-16 LAB — COMPREHENSIVE METABOLIC PANEL
ALT: 20 U/L (ref 0–35)
AST: 23 U/L (ref 0–37)
Albumin: 4.6 g/dL (ref 3.5–5.2)
Alkaline Phosphatase: 69 U/L (ref 39–117)
BUN: 24 mg/dL — ABNORMAL HIGH (ref 6–23)
CO2: 26 meq/L (ref 19–32)
Calcium: 9.9 mg/dL (ref 8.4–10.5)
Chloride: 105 meq/L (ref 96–112)
Creatinine, Ser: 0.75 mg/dL (ref 0.40–1.20)
GFR: 87.83 mL/min (ref >60.00)
Glucose, Bld: 90 mg/dL (ref 70–99)
Potassium: 4.2 meq/L (ref 3.5–5.1)
Sodium: 140 meq/L (ref 135–145)
Total Bilirubin: 0.4 mg/dL (ref 0.2–1.2)
Total Protein: 6.9 g/dL (ref 6.0–8.3)

## 2024-03-16 LAB — MICROALBUMIN / CREATININE URINE RATIO
Creatinine,U: 106.5 mg/dL
Microalb Creat Ratio: 10.3 mg/g (ref 0.0–30.0)
Microalb, Ur: 1.1 mg/dL (ref 0.0–1.9)

## 2024-03-16 LAB — TSH: TSH: 0.61 u[IU]/mL (ref 0.35–5.50)

## 2024-03-16 LAB — CBC
HCT: 44.4 % (ref 36.0–46.0)
Hemoglobin: 14.7 g/dL (ref 12.0–15.0)
MCHC: 33.1 g/dL (ref 30.0–36.0)
MCV: 92 fl (ref 78.0–100.0)
Platelets: 275 10*3/uL (ref 150.0–400.0)
RBC: 4.82 Mil/uL (ref 3.87–5.11)
RDW: 12.6 % (ref 11.5–15.5)
WBC: 11.8 10*3/uL — ABNORMAL HIGH (ref 4.0–10.5)

## 2024-03-16 LAB — LIPID PANEL
Cholesterol: 120 mg/dL (ref 0–200)
HDL: 56.2 mg/dL (ref >39.00)
LDL Cholesterol: 52 mg/dL (ref 0–99)
NonHDL: 64.25
Total CHOL/HDL Ratio: 2
Triglycerides: 61 mg/dL (ref 0.0–149.0)
VLDL: 12.2 mg/dL (ref 0.0–40.0)

## 2024-03-16 LAB — HEMOGLOBIN A1C: Hgb A1c MFr Bld: 5.9 % (ref 4.6–6.5)

## 2024-04-17 ENCOUNTER — Other Ambulatory Visit: Payer: Self-pay | Admitting: Family Medicine

## 2024-05-27 ENCOUNTER — Telehealth: Payer: Self-pay

## 2024-05-27 NOTE — Telephone Encounter (Signed)
 Reminder for mammogram sent. Order placed at pt CPE.

## 2024-07-08 ENCOUNTER — Ambulatory Visit (INDEPENDENT_AMBULATORY_CARE_PROVIDER_SITE_OTHER): Admitting: Family Medicine

## 2024-07-08 ENCOUNTER — Encounter: Payer: Self-pay | Admitting: Family Medicine

## 2024-07-08 VITALS — BP 138/84 | HR 82 | Wt 168.0 lb

## 2024-07-08 DIAGNOSIS — R350 Frequency of micturition: Secondary | ICD-10-CM | POA: Diagnosis not present

## 2024-07-08 DIAGNOSIS — R300 Dysuria: Secondary | ICD-10-CM

## 2024-07-08 LAB — POC URINALSYSI DIPSTICK (AUTOMATED)
Bilirubin, UA: NEGATIVE
Blood, UA: POSITIVE
Glucose, UA: NEGATIVE
Ketones, UA: NEGATIVE
Nitrite, UA: NEGATIVE
Protein, UA: NEGATIVE
Spec Grav, UA: 1.015 (ref 1.010–1.025)
Urobilinogen, UA: 0.2 U/dL
pH, UA: 6.5 (ref 5.0–8.0)

## 2024-07-08 MED ORDER — SULFAMETHOXAZOLE-TRIMETHOPRIM 800-160 MG PO TABS: 1.0000 | ORAL_TABLET | Freq: Two times a day (BID) | ORAL | 0 refills | Status: AC

## 2024-07-08 NOTE — Progress Notes (Signed)
 OFFICE VISIT  07/08/2024  CC:  Chief Complaint  Patient presents with   Dysuria    A few days; cloudy urine, dysuria, urinary frequency.     Patient is a 58 y.o. female who presents for urinary concern.  HPI: 3-day history of burning with urination, urinary frequency, dark yellow urine, and urinary odor. No suprapubic discomfort, nausea, flank pain, or blood in urine.   Urine culture showed Proteus mirabilis on 02/03/2023, resistant to ciprofloxacin, levofloxacin, and nitrofurantoin .  Past Medical History:  Diagnosis Date   Abnormal LFTs 02/23/2012   Anxiety    Cervical cancer screening 02/19/2012   Ectatic abdominal aorta (HCC) 11/03/2018   Repeat 2024, no evidence of aneurysm present   Hyperglycemia    Hyperlipidemia    Hypertension    Tobacco abuse 01/26/2012    Past Surgical History:  Procedure Laterality Date   CESAREAN SECTION  1990    Outpatient Medications Prior to Visit  Medication Sig Dispense Refill   ACCU-CHEK GUIDE TEST test strip USE 1 TO CHECK GLUCOSE IN THE MORNING, AT NOON, AND AT BEDTIME. 100 each 0   Blood Glucose Monitoring Suppl DEVI Check glucose twice daily. 1 each 0   Cholecalciferol 50 MCG (2000 UT) TABS Take 2,000 Units by mouth daily.     estradiol  (ESTRACE  VAGINAL) 0.1 MG/GM vaginal cream Place 1 Applicatorful vaginally at bedtime. Place 2gm (.2mg )  vaginally every night for 2 weeks then twice weekly long-term 42.5 g 12   famotidine  (PEPCID ) 20 MG tablet Take 1 tablet (20 mg total) by mouth 2 (two) times daily. 60 tablet 5   fenofibrate  (TRICOR ) 145 MG tablet Take 1 tablet (145 mg total) by mouth daily. 30 tablet 11   lisinopril -hydrochlorothiazide  (ZESTORETIC ) 10-12.5 MG tablet Take 1 tablet by mouth daily. 30 tablet 5   metFORMIN  (GLUCOPHAGE ) 500 MG tablet Take 1 tablet (500 mg total) by mouth daily with breakfast. 30 tablet 5   metoprolol  succinate (TOPROL -XL) 100 MG 24 hr tablet TAKE 1 TABLET BY MOUTH ONCE DAILY WITH OR IMMEDIATELY  FOLLOWING A MEAL 30 tablet 5   metoprolol  succinate (TOPROL -XL) 25 MG 24 hr tablet Take 1 tablet (25 mg total) by mouth daily. 30 tablet 5   Multiple Vitamin (MULTIVITAMIN) tablet Take 1 tablet by mouth daily.     Omega-3 Fatty Acids (FISH OIL) 1000 MG CAPS Take 1 capsule by mouth daily.     rosuvastatin  (CRESTOR ) 10 MG tablet Take 1 tablet (10 mg total) by mouth daily. 30 tablet 11   omeprazole  (PRILOSEC) 40 MG capsule Take 1 capsule (40 mg total) by mouth at bedtime. (Patient not taking: Reported on 07/08/2024) 30 capsule 5   No facility-administered medications prior to visit.    No Known Allergies  Review of Systems  As per HPI  PE:    07/08/2024    4:29 PM 03/15/2024    1:53 PM 10/22/2023    3:12 PM  Vitals with BMI  Height  5' 2 5' 1.81  Weight 168 lbs 168 lbs 163 lbs 10 oz  BMI  30.72 30.11  Systolic 138 136 856  Diastolic 84 80 63  Pulse 82 79 71     Physical Exam  General: Alert and well-appearing. Affect is pleasant, thought and speech are lucid. No further exam today.  LABS:  Last CBC Lab Results  Component Value Date   WBC 11.8 (H) 03/15/2024   HGB 14.7 03/15/2024   HCT 44.4 03/15/2024   MCV 92.0 03/15/2024   MCH  30.3 04/07/2020   RDW 12.6 03/15/2024   PLT 275.0 03/15/2024   Last metabolic panel Lab Results  Component Value Date   GLUCOSE 90 03/15/2024   NA 140 03/15/2024   K 4.2 03/15/2024   CL 105 03/15/2024   CO2 26 03/15/2024   BUN 24 (H) 03/15/2024   CREATININE 0.75 03/15/2024   GFR 87.83 03/15/2024   CALCIUM  9.9 03/15/2024   PHOS 3.1 05/19/2012   PROT 6.9 03/15/2024   ALBUMIN 4.6 03/15/2024   BILITOT 0.4 03/15/2024   ALKPHOS 69 03/15/2024   AST 23 03/15/2024   ALT 20 03/15/2024   Last hemoglobin A1c Lab Results  Component Value Date   HGBA1C 5.9 03/15/2024   IMPRESSION AND PLAN:  Acute cystitis. UA today showed 1+ LEU, trace blood, otherwise neg/nl. Urine c/s sent. Start Bactrim  double strength, 1 twice daily x 3  days.  An After Visit Summary was printed and given to the patient.  FOLLOW UP: Return if symptoms worsen or fail to improve.  Signed:  Gerlene Hockey, MD           07/08/2024

## 2024-07-09 LAB — URINE CULTURE
MICRO NUMBER:: 16712402
SPECIMEN QUALITY:: ADEQUATE

## 2024-07-12 ENCOUNTER — Ambulatory Visit: Payer: Self-pay | Admitting: Family Medicine

## 2024-08-30 ENCOUNTER — Ambulatory Visit (INDEPENDENT_AMBULATORY_CARE_PROVIDER_SITE_OTHER): Payer: Self-pay | Admitting: Family Medicine

## 2024-08-30 DIAGNOSIS — Z6832 Body mass index (BMI) 32.0-32.9, adult: Secondary | ICD-10-CM

## 2024-08-30 DIAGNOSIS — Z91199 Patient's noncompliance with other medical treatment and regimen due to unspecified reason: Secondary | ICD-10-CM

## 2024-08-30 DIAGNOSIS — E785 Hyperlipidemia, unspecified: Secondary | ICD-10-CM

## 2024-08-30 DIAGNOSIS — Z23 Encounter for immunization: Secondary | ICD-10-CM

## 2024-08-30 DIAGNOSIS — E1169 Type 2 diabetes mellitus with other specified complication: Secondary | ICD-10-CM

## 2024-08-30 DIAGNOSIS — E559 Vitamin D deficiency, unspecified: Secondary | ICD-10-CM

## 2024-08-30 DIAGNOSIS — K219 Gastro-esophageal reflux disease without esophagitis: Secondary | ICD-10-CM

## 2024-08-30 DIAGNOSIS — I10 Essential (primary) hypertension: Secondary | ICD-10-CM

## 2024-08-30 NOTE — Progress Notes (Signed)
 No show

## 2024-08-30 NOTE — Patient Instructions (Addendum)

## 2024-08-31 ENCOUNTER — Encounter: Payer: Self-pay | Admitting: Family Medicine

## 2024-09-15 ENCOUNTER — Ambulatory Visit: Admitting: Family Medicine

## 2024-09-15 ENCOUNTER — Encounter: Payer: Self-pay | Admitting: Family Medicine

## 2024-09-15 VITALS — BP 128/76 | HR 75 | Temp 98.3°F | Wt 173.0 lb

## 2024-09-15 DIAGNOSIS — Z23 Encounter for immunization: Secondary | ICD-10-CM | POA: Diagnosis not present

## 2024-09-15 DIAGNOSIS — Z7984 Long term (current) use of oral hypoglycemic drugs: Secondary | ICD-10-CM

## 2024-09-15 DIAGNOSIS — E785 Hyperlipidemia, unspecified: Secondary | ICD-10-CM | POA: Diagnosis not present

## 2024-09-15 DIAGNOSIS — Z6832 Body mass index (BMI) 32.0-32.9, adult: Secondary | ICD-10-CM

## 2024-09-15 DIAGNOSIS — I10 Essential (primary) hypertension: Secondary | ICD-10-CM | POA: Diagnosis not present

## 2024-09-15 DIAGNOSIS — K219 Gastro-esophageal reflux disease without esophagitis: Secondary | ICD-10-CM

## 2024-09-15 DIAGNOSIS — E1169 Type 2 diabetes mellitus with other specified complication: Secondary | ICD-10-CM | POA: Diagnosis not present

## 2024-09-15 DIAGNOSIS — R002 Palpitations: Secondary | ICD-10-CM

## 2024-09-15 DIAGNOSIS — E559 Vitamin D deficiency, unspecified: Secondary | ICD-10-CM

## 2024-09-15 LAB — POCT GLYCOSYLATED HEMOGLOBIN (HGB A1C)
HbA1c POC (<> result, manual entry): 5.6 % (ref 4.0–5.6)
HbA1c, POC (controlled diabetic range): 5.6 % (ref 0.0–7.0)
HbA1c, POC (prediabetic range): 5.6 % — AB (ref 5.7–6.4)
Hemoglobin A1C: 5.6 % (ref 4.0–5.6)

## 2024-09-15 MED ORDER — LISINOPRIL-HYDROCHLOROTHIAZIDE 10-12.5 MG PO TABS: 1.0000 | ORAL_TABLET | Freq: Every day | ORAL | 5 refills | Status: AC

## 2024-09-15 MED ORDER — METOPROLOL SUCCINATE ER 100 MG PO TB24: ORAL_TABLET | ORAL | 5 refills | Status: AC

## 2024-09-15 MED ORDER — FENOFIBRATE 145 MG PO TABS: 145.0000 mg | ORAL_TABLET | Freq: Every day | ORAL | 11 refills | Status: AC

## 2024-09-15 MED ORDER — METFORMIN HCL 500 MG PO TABS: 500.0000 mg | ORAL_TABLET | Freq: Every day | ORAL | 5 refills | Status: AC

## 2024-09-15 MED ORDER — FAMOTIDINE 20 MG PO TABS: 20.0000 mg | ORAL_TABLET | Freq: Two times a day (BID) | ORAL | 5 refills | Status: AC

## 2024-09-15 MED ORDER — METOPROLOL SUCCINATE ER 25 MG PO TB24: 25.0000 mg | ORAL_TABLET | Freq: Every day | ORAL | 5 refills | Status: AC

## 2024-09-15 NOTE — Progress Notes (Signed)
 Patient Care Team    Relationship Specialty Notifications Start End  Catherine Charlies LABOR, DO PCP - General Family Medicine  01/17/16   Tad Arland POUR, CNM Midwife Certified Nurse Midwife  11/24/23     SUBJECTIVE Chief Complaint  Patient presents with   Hyperlipidemia   Hypertension   Diabetes    Chronic Conditions/illness Management     HPI: Teresa Calderon is a 58 y.o. female present for chronic Conditions/illness Management combination appointment..All past medical history, surgical history, allergies, family history, immunizations and social history was obtained from the patient today and updated into the electronic medical record.    type 2 diabetes mellitus (HCC) Diagnosed 10/26/2018 with a A1c 13.9.     Patient reports compliance with metformin  500 mg daily.   Januvia  had been discontinued secondary to very well controlled diabetes and metformin  decreased.  Patient denies dizziness, hyperglycemic or hypoglycemic events. Patient denies numbness, tingling in the extremities or nonhealing wounds of feet.   Essential hypertension, benign/. Morbid obesity (HCC)/Ectatic abdominal aorta (HCC)/Elevated LFTs/Hepatic steatosis/Hyperlipidemia LDL goal <130/statin declined Patient reports compliance with metoprolol  125 mg daily, lisinopril /HCTZ 10-12.5 mg .  SHe is taking fenofibrate   and crestor .  Patient denies chest pain, shortness of breath, dizziness or lower extremity edema.   Had been established with cardiology RF: Hypertension, hyperlipidemia, Fhx coronary artery disease   ROS: See pertinent positives and negatives per HPI.  Patient Active Problem List   Diagnosis Date Noted   Vaginal atrophy 10/22/2023   Smoking greater than 20 pack years 12/26/2022   GERD (gastroesophageal reflux disease) 12/26/2022   Lung cancer screening declined by patient 01/23/2022   BMI 32.0-32.9,adult 08/21/2021   Type 2 diabetes mellitus with hyperlipidemia (HCC) 10/28/2018   Leukocytosis 10/28/2018    Elevated LFTs 10/28/2018   Tobacco abuse 10/26/2018   Morbid obesity (HCC) 04/11/2017   Vitamin D  deficiency 01/31/2015   Essential hypertension, benign 03/08/2014   Palpitations 01/20/2012    Social History   Tobacco Use   Smoking status: Every Day    Current packs/day: 1.00    Average packs/day: 1 pack/day for 30.0 years (30.0 ttl pk-yrs)    Types: Cigarettes   Smokeless tobacco: Never  Substance Use Topics   Alcohol use: No    Alcohol/week: 0.0 standard drinks of alcohol    Current Outpatient Medications:    ACCU-CHEK GUIDE TEST test strip, USE 1 TO CHECK GLUCOSE IN THE MORNING, AT NOON, AND AT BEDTIME., Disp: 100 each, Rfl: 0   Blood Glucose Monitoring Suppl DEVI, Check glucose twice daily., Disp: 1 each, Rfl: 0   Cholecalciferol 50 MCG (2000 UT) TABS, Take 2,000 Units by mouth daily., Disp: , Rfl:    estradiol  (ESTRACE  VAGINAL) 0.1 MG/GM vaginal cream, Place 1 Applicatorful vaginally at bedtime. Place 2gm (.2mg )  vaginally every night for 2 weeks then twice weekly long-term, Disp: 42.5 g, Rfl: 12   Multiple Vitamin (MULTIVITAMIN) tablet, Take 1 tablet by mouth daily., Disp: , Rfl:    Omega-3 Fatty Acids (FISH OIL) 1000 MG CAPS, Take 1 capsule by mouth daily., Disp: , Rfl:    rosuvastatin  (CRESTOR ) 10 MG tablet, Take 1 tablet (10 mg total) by mouth daily., Disp: 30 tablet, Rfl: 11   famotidine  (PEPCID ) 20 MG tablet, Take 1 tablet (20 mg total) by mouth 2 (two) times daily., Disp: 60 tablet, Rfl: 5   fenofibrate  (TRICOR ) 145 MG tablet, Take 1 tablet (145 mg total) by mouth daily., Disp: 30 tablet, Rfl: 11  lisinopril -hydrochlorothiazide  (ZESTORETIC ) 10-12.5 MG tablet, Take 1 tablet by mouth daily., Disp: 30 tablet, Rfl: 5   metFORMIN  (GLUCOPHAGE ) 500 MG tablet, Take 1 tablet (500 mg total) by mouth daily with breakfast., Disp: 30 tablet, Rfl: 5   metoprolol  succinate (TOPROL -XL) 100 MG 24 hr tablet, TAKE 1 TABLET BY MOUTH ONCE DAILY WITH OR IMMEDIATELY FOLLOWING A MEAL, Disp:  30 tablet, Rfl: 5   metoprolol  succinate (TOPROL -XL) 25 MG 24 hr tablet, Take 1 tablet (25 mg total) by mouth daily., Disp: 30 tablet, Rfl: 5   omeprazole  (PRILOSEC) 40 MG capsule, Take 1 capsule (40 mg total) by mouth at bedtime. (Patient not taking: Reported on 09/15/2024), Disp: 30 capsule, Rfl: 5  No Known Allergies  OBJECTIVE: BP 128/76   Pulse 75   Temp 98.3 F (36.8 C)   Wt 173 lb (78.5 kg)   LMP 04/17/2015   SpO2 97%   BMI 31.64 kg/m  Physical Exam Vitals and nursing note reviewed.  Constitutional:      General: She is not in acute distress.    Appearance: Normal appearance. She is not ill-appearing, toxic-appearing or diaphoretic.  HENT:     Head: Normocephalic and atraumatic.  Eyes:     General: No scleral icterus.       Right eye: No discharge.        Left eye: No discharge.     Extraocular Movements: Extraocular movements intact.     Conjunctiva/sclera: Conjunctivae normal.     Pupils: Pupils are equal, round, and reactive to light.  Cardiovascular:     Rate and Rhythm: Normal rate and regular rhythm.     Heart sounds: No murmur heard. Pulmonary:     Effort: Pulmonary effort is normal. No respiratory distress.     Breath sounds: Normal breath sounds. No wheezing, rhonchi or rales.  Musculoskeletal:     Right lower leg: No edema.     Left lower leg: No edema.  Skin:    General: Skin is warm.     Findings: No rash.  Neurological:     Mental Status: She is alert and oriented to person, place, and time. Mental status is at baseline.     Motor: No weakness.     Gait: Gait normal.  Psychiatric:        Mood and Affect: Mood normal.        Behavior: Behavior normal.        Thought Content: Thought content normal.        Judgment: Judgment normal.     Diabetic Foot Exam - Simple   No data filed     Results for orders placed or performed in visit on 09/15/24 (from the past 24 hours)  POCT HgB A1C     Status: Abnormal   Collection Time: 09/15/24  1:59 PM   Result Value Ref Range   Hemoglobin A1C 5.6 4.0 - 5.6 %   HbA1c POC (<> result, manual entry) 5.6 4.0 - 5.6 %   HbA1c, POC (prediabetic range) 5.6 (A) 5.7 - 6.4 %   HbA1c, POC (controlled diabetic range) 5.6 0.0 - 7.0 %      ASSESSMENT AND PLAN: Teresa Calderon is a 58 y.o. female present for chronic conditions management combination appointment  type 2 diabetes mellitus w/ hyperlipidemia(HCC) Stable -Discontinued Javuvia for great control.  Last visit Continue metformin  500 mg daily.  -Continue crestor  PNA series: Prevnar 20 updated  03/11/2024 Flu shot: UTD completed today (recommneded yearly) Foot exam: completed today  03/15/2024 Eye exam: referred again-had been referred again on 07/10/2022.  Had been referred again 03/11/2024 Microalbumin : +> started ace> microalbumin collected today 03/15/2024 A1c: 13.9 (10/26/2018)--> 5.4 >> 5.3> 5.0>5.1>5.4> 5.5> 5.5>5.6> 5.4 > 5.9> 5.6 A1C ordered today   GERD:  Stable Continue omeprazole  40 mg daily.  As needed Continue Pepcid  to her regimen today for reports of worsening reflux pain of her epigastric region.  Essential hypertension, benign/. Morbid obesity (HCC)/Ectatic abdominal aorta (HCC)/Elevated LFTs/Hepatic steatosis/Hyperlipidemia LDL goal <70/Palpitations Stable -Continue lisinopril -HCTZ 10 mg - 12.5 mg daily -Continue metoprolol  125 mg daily  -Continue fenofibrate .   -Continue crestor  10 mg qd    Return in about 26 weeks (around 03/16/2025) for cpe (20 min), Routine chronic condition follow-up.   Orders Placed This Encounter  Procedures   Flu vaccine trivalent PF, 6mos and older(Flulaval,Afluria,Fluarix,Fluzone)   POCT HgB A1C   Meds ordered this encounter  Medications   famotidine  (PEPCID ) 20 MG tablet    Sig: Take 1 tablet (20 mg total) by mouth 2 (two) times daily.    Dispense:  60 tablet    Refill:  5   fenofibrate  (TRICOR ) 145 MG tablet    Sig: Take 1 tablet (145 mg total) by mouth daily.    Dispense:  30 tablet     Refill:  11   lisinopril -hydrochlorothiazide  (ZESTORETIC ) 10-12.5 MG tablet    Sig: Take 1 tablet by mouth daily.    Dispense:  30 tablet    Refill:  5   metFORMIN  (GLUCOPHAGE ) 500 MG tablet    Sig: Take 1 tablet (500 mg total) by mouth daily with breakfast.    Dispense:  30 tablet    Refill:  5   metoprolol  succinate (TOPROL -XL) 100 MG 24 hr tablet    Sig: TAKE 1 TABLET BY MOUTH ONCE DAILY WITH OR IMMEDIATELY FOLLOWING A MEAL    Dispense:  30 tablet    Refill:  5   metoprolol  succinate (TOPROL -XL) 25 MG 24 hr tablet    Sig: Take 1 tablet (25 mg total) by mouth daily.    Dispense:  30 tablet    Refill:  5    Add on to 100 mg tab    Referral Orders  No referral(s) requested today      Charlies Bellini, DO 09/15/2024

## 2024-09-15 NOTE — Patient Instructions (Addendum)

## 2024-10-13 ENCOUNTER — Other Ambulatory Visit: Payer: Self-pay | Admitting: Family Medicine

## 2024-11-25 ENCOUNTER — Encounter: Payer: Self-pay | Admitting: Family Medicine

## 2024-11-25 ENCOUNTER — Ambulatory Visit: Payer: Self-pay

## 2024-11-25 ENCOUNTER — Ambulatory Visit: Admitting: Family Medicine

## 2024-11-25 VITALS — BP 129/84 | HR 77 | Temp 98.5°F | Ht 62.0 in | Wt 177.6 lb

## 2024-11-25 DIAGNOSIS — N3001 Acute cystitis with hematuria: Secondary | ICD-10-CM

## 2024-11-25 DIAGNOSIS — R350 Frequency of micturition: Secondary | ICD-10-CM

## 2024-11-25 DIAGNOSIS — R300 Dysuria: Secondary | ICD-10-CM

## 2024-11-25 LAB — POCT URINALYSIS DIPSTICK
Bilirubin, UA: NEGATIVE
Glucose, UA: NEGATIVE
Ketones, UA: NEGATIVE
Nitrite, UA: NEGATIVE
Protein, UA: POSITIVE — AB
Spec Grav, UA: 1.015 (ref 1.010–1.025)
Urobilinogen, UA: 0.2 U/dL
pH, UA: 6 (ref 5.0–8.0)

## 2024-11-25 MED ORDER — SULFAMETHOXAZOLE-TRIMETHOPRIM 800-160 MG PO TABS: 1.0000 | ORAL_TABLET | Freq: Two times a day (BID) | ORAL | 0 refills | Status: AC

## 2024-11-25 NOTE — Progress Notes (Signed)
 OFFICE VISIT  11/25/2024  CC:  Chief Complaint  Patient presents with   Urinary Concern    Pt c/o urinary frequency and burning for 3-4 days; cloudy, has not taken any meds OTC. Denies back or side pain    Patient is a 58 y.o. female who presents for pain with urination.  HPI: Onset 3 days ago---> burning with urination, urinary urgency, urinary frequency. No fever, abdominal pain, nausea, flank pain, or blood in urine.  I treated her empirically for UTI 07/08/2024 when she presented with classic cystitis symptoms--> Bactrim . urine culture showed mixed vaginal flora.  Urine culture showed Proteus mirabilis on 02/03/2023, resistant to ciprofloxacin, levofloxacin, and nitrofurantoin .   Past Medical History:  Diagnosis Date   Abnormal LFTs 02/23/2012   Anxiety    Cervical cancer screening 02/19/2012   Ectatic abdominal aorta 11/03/2018   Repeat 2024, no evidence of aneurysm present   Hyperglycemia    Hyperlipidemia    Hypertension    Tobacco abuse 01/26/2012    Past Surgical History:  Procedure Laterality Date   CESAREAN SECTION  1990    Outpatient Medications Prior to Visit  Medication Sig Dispense Refill   ACCU-CHEK GUIDE TEST test strip USE 1 TO CHECK GLUCOSE IN THE MORNING, AT NOON, AND AT BEDTIME. 100 each 0   Blood Glucose Monitoring Suppl DEVI Check glucose twice daily. 1 each 0   Cholecalciferol 50 MCG (2000 UT) TABS Take 2,000 Units by mouth daily.     estradiol  (ESTRACE  VAGINAL) 0.1 MG/GM vaginal cream Place 1 Applicatorful vaginally at bedtime. Place 2gm (.2mg )  vaginally every night for 2 weeks then twice weekly long-term 42.5 g 12   famotidine  (PEPCID ) 20 MG tablet Take 1 tablet (20 mg total) by mouth 2 (two) times daily. 60 tablet 5   fenofibrate  (TRICOR ) 145 MG tablet Take 1 tablet (145 mg total) by mouth daily. 30 tablet 11   lisinopril -hydrochlorothiazide  (ZESTORETIC ) 10-12.5 MG tablet Take 1 tablet by mouth daily. 30 tablet 5   metFORMIN  (GLUCOPHAGE ) 500 MG  tablet Take 1 tablet (500 mg total) by mouth daily with breakfast. 30 tablet 5   metoprolol  succinate (TOPROL -XL) 100 MG 24 hr tablet TAKE 1 TABLET BY MOUTH ONCE DAILY WITH OR IMMEDIATELY FOLLOWING A MEAL 30 tablet 5   metoprolol  succinate (TOPROL -XL) 25 MG 24 hr tablet Take 1 tablet (25 mg total) by mouth daily. 30 tablet 5   Multiple Vitamin (MULTIVITAMIN) tablet Take 1 tablet by mouth daily.     Omega-3 Fatty Acids (FISH OIL) 1000 MG CAPS Take 1 capsule by mouth daily.     omeprazole  (PRILOSEC) 40 MG capsule Take 1 capsule by mouth at bedtime 90 capsule 1   rosuvastatin  (CRESTOR ) 10 MG tablet Take 1 tablet (10 mg total) by mouth daily. 30 tablet 11   No facility-administered medications prior to visit.    No Known Allergies  Review of Systems  As per HPI  PE:    11/25/2024    1:56 PM 11/25/2024    1:53 PM 09/15/2024    2:02 PM  Vitals with BMI  Height  5' 2   Weight  177 lbs 10 oz   BMI  32.48   Systolic 129 138 871  Diastolic 84 85 76  Pulse  77     Physical Exam  Gen: Alert, well appearing.  Patient is oriented to person, place, time, and situation. AFFECT: pleasant, lucid thought and speech. No further exam today  LABS:  Last CBC Lab Results  Component Value Date   WBC 11.8 (H) 03/15/2024   HGB 14.7 03/15/2024   HCT 44.4 03/15/2024   MCV 92.0 03/15/2024   MCH 30.3 04/07/2020   RDW 12.6 03/15/2024   PLT 275.0 03/15/2024   Last metabolic panel Lab Results  Component Value Date   GLUCOSE 90 03/15/2024   NA 140 03/15/2024   K 4.2 03/15/2024   CL 105 03/15/2024   CO2 26 03/15/2024   BUN 24 (H) 03/15/2024   CREATININE 0.75 03/15/2024   GFR 87.83 03/15/2024   CALCIUM  9.9 03/15/2024   PHOS 3.1 05/19/2012   PROT 6.9 03/15/2024   ALBUMIN 4.6 03/15/2024   BILITOT 0.4 03/15/2024   ALKPHOS 69 03/15/2024   AST 23 03/15/2024   ALT 20 03/15/2024   Lab Results  Component Value Date   HGBA1C 5.6 09/15/2024   HGBA1C 5.6 09/15/2024   HGBA1C 5.6 (A) 09/15/2024    HGBA1C 5.6 09/15/2024   IMPRESSION AND PLAN:  Acute cystitis with microhematuria. UA today --->3+ LEU, 1+ blood, otherwise normal. Bactrim  double strength, 1 twice daily x 3 days. Sent urine specimen for culture  An After Visit Summary was printed and given to the patient.  FOLLOW UP: Return if symptoms worsen or fail to improve.  Signed:  Gerlene Hockey, MD           11/25/2024

## 2024-11-25 NOTE — Telephone Encounter (Signed)
 FYI Only or Action Required?: FYI only for provider: appointment scheduled on 11/25/2024 at 1:40pm at PCP office with Dr Aleene Hockey.  Patient was last seen in primary care on 09/15/2024 by Catherine Fuller A, DO.  Called Nurse Triage reporting Dysuria.  Symptoms began 3-4 days ago.  Interventions attempted: Nothing.  Symptoms are: gradually worsening.  Triage Disposition: See HCP Within 4 Hours (Or PCP Triage)  Patient/caregiver understands and will follow disposition?: Yes             Copied from CRM #[id number]. Topic: Clinical - Red Word Triage >> Nov 25, 2024 10:48 AM Robinson H wrote: [facility] that prompted transfer to Nurse Triage: Possible UTI/Hurts to urinate >> Nov 25, 2024 10:51 AM Robinson H wrote: Patient states she's in her car and might get disconnected if so callback number is [phone]  Reason for Disposition  Diabetes mellitus or weak immune system (e.g., HIV positive, cancer chemo, splenectomy, organ transplant, chronic steroids)  Protocols used: Urination Pain - [facility]

## 2024-11-28 ENCOUNTER — Ambulatory Visit: Payer: Self-pay | Admitting: Family Medicine

## 2024-11-28 LAB — URINE CULTURE
MICRO NUMBER:: 17313247
SPECIMEN QUALITY:: ADEQUATE

## 2025-03-18 ENCOUNTER — Encounter: Admitting: Family Medicine
